# Patient Record
Sex: Male | Born: 1980 | Hispanic: Yes | Marital: Single
Health system: Midwestern US, Academic
[De-identification: ages and names within clinical notes are randomized; demographics above are authoritative.]

---

## 2020-09-12 ENCOUNTER — Encounter: Admit: 2020-09-12 | Discharge: 2020-09-12

## 2020-09-13 ENCOUNTER — Encounter: Admit: 2020-09-13 | Discharge: 2020-09-13

## 2020-09-13 NOTE — Telephone Encounter
Records Request    Please fax over the following records including:     -Radiology testing:  FULL Imaging report supporting diagnosis of Peripheral Artery Disease    *Send imaging via Cloud/Powershare system to [facility] mail the requested imaging to   [facility], [address], [place], [place] [address]    Please fax to:   Jeannine K[facility]  Fax: [phone]    Thank you!

## 2020-10-25 ENCOUNTER — Encounter: Admit: 2020-10-25 | Discharge: 2020-10-25 | Primary: Primary Care

## 2020-10-25 NOTE — Progress Notes
Patient Name:  Dailyn Kempner  MRN:  [id number]  DOB:  Dec 12, 1980  Insurance:  No billing information found for this encounter.         Reason for Visit:  New patient   Diagnosis: Veins      Physician Info:    Referring Physician:  No ref. provider found    PCP:  Miguel Rota D      History of Present Illness:  40 year old male referred for right leg swelling and redness. Hx ulcer x2 years.      Scheduled testing: none, patient self-pay      Location of Films:      Date: Imaging: Impression:                         Medical History   has no past medical history on file.          Surgical History  has no past surgical history on file.    Allergies No Known Allergies       Medications:        hydroCHLOROthiazide (HYDRODIURIL) 25 mg tablet Take 25 mg by mouth daily.    losartan (COZAAR) 50 mg tablet Take 50 mg by mouth daily.    metFORMIN (GLUCOPHAGE) 1,000 mg tablet Take 1,000 mg by mouth twice daily after meals.    mupirocin (BACTROBAN) 2 % topical ointment APPLY TO THE AFFECTED AREA(S) THREE TIMES DAILY           Creatinine:     Comments:

## 2020-10-29 ENCOUNTER — Encounter: Admit: 2020-10-29 | Discharge: 2020-10-29 | Primary: Primary Care

## 2020-10-29 ENCOUNTER — Ambulatory Visit: Admit: 2020-10-29 | Discharge: 2020-10-30 | Primary: Primary Care

## 2020-10-29 DIAGNOSIS — I10 Essential (primary) hypertension: Secondary | ICD-10-CM

## 2020-10-29 DIAGNOSIS — I8390 Asymptomatic varicose veins of unspecified lower extremity: Secondary | ICD-10-CM

## 2020-10-29 DIAGNOSIS — E7800 Pure hypercholesterolemia, unspecified: Secondary | ICD-10-CM

## 2020-10-29 DIAGNOSIS — E1151 Type 2 diabetes mellitus with diabetic peripheral angiopathy without gangrene: Secondary | ICD-10-CM

## 2020-10-29 DIAGNOSIS — E119 Type 2 diabetes mellitus without complications: Secondary | ICD-10-CM

## 2020-10-29 NOTE — Progress Notes
Date of Service: 10/29/2020              No chief complaint on file.      History of Present Illness    40 year old Spanish-speaking gentleman who presents with complaints of chronic venous insufficiency.  He has noted scarring of the right lower extremity associated with large varicose veins.  He has had no imaging.  He has no history for deep vein thrombosis.  He has not worn compression stockings.  Is no family history of venous disease.    His symptoms include previous ulceration with pain, aching, heaviness, fatigue, throbbing and burning of approximately 2 years duration.  He denies any trauma to the area.    No past medical history on file.    No past surgical history on file.    Allergies:  No Known Allergies    Medication List:  ? hydroCHLOROthiazide (HYDRODIURIL) 25 mg tablet Take 25 mg by mouth daily.   ? losartan (COZAAR) 50 mg tablet Take 50 mg by mouth daily.   ? metFORMIN (GLUCOPHAGE) 1,000 mg tablet Take 1,000 mg by mouth twice daily after meals.   ? mupirocin (BACTROBAN) 2 % topical ointment APPLY TO THE AFFECTED AREA(S) THREE TIMES DAILY       Social History:       No family history on file.    Review of Systems   Constitutional: Negative.    HENT: Negative.    Eyes: Negative.    Respiratory: Negative.    Cardiovascular: Negative.    Gastrointestinal: Negative.    Endocrine: Negative.    Genitourinary: Negative.    Musculoskeletal: Negative.    Skin: Negative.    Allergic/Immunologic: Negative.    Neurological: Positive for numbness.   Hematological: Negative.    Psychiatric/Behavioral: Negative.                There were no vitals filed for this visit.  There is no height or weight on file to calculate BMI.     Physical Exam  Constitutional:       Appearance: Normal appearance. He is obese.   HENT:      Head: Normocephalic and atraumatic.   Neck:      Comments:   No carotid bruits  Cardiovascular:      Rate and Rhythm: Normal rate and regular rhythm.      Pulses: Normal pulses.      Comments: Large varicose veins right leg extending from above the knee to below the knee, great saphenous distribution.  There is evidence of lipodermatosclerosis in a stocking-like distribution on the medial aspect of the right lower extremity with healed ulcer.  He has 1+ edema, bilaterally.  He has no noticed varicose veins involving the left leg.  Pulmonary:      Effort: Pulmonary effort is normal.      Breath sounds: Normal breath sounds.   Neurological:      General: No focal deficit present.      Mental Status: He is alert and oriented to person, place, and time.             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  2.  History of ulceration, healed.  He has history of edema as well.  3.  Hypercholesterolemia.  4.  Type 2 diabetes.  5.  Essential hypertension.  6. Right Leg VCSS Total: 6; CEAP: 5.  Left Leg VCSS Total: 3; CEAP: 3  Plan: #1 patient is a candidate for venous imaging of the right lower extremity.  I suspect that he has great saphenous insufficiency and would benefit from intervention.  2.  Compression therapy.    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

## 2020-10-30 ENCOUNTER — Encounter: Admit: 2020-10-30 | Discharge: 2020-10-30 | Primary: Primary Care

## 2020-10-30 ENCOUNTER — Ambulatory Visit: Admit: 2020-10-30 | Discharge: 2020-10-30 | Primary: Primary Care

## 2020-10-30 DIAGNOSIS — I83811 Varicose veins of right lower extremities with pain: Secondary | ICD-10-CM

## 2020-10-30 DIAGNOSIS — I8311 Varicose veins of right lower extremity with inflammation: Secondary | ICD-10-CM

## 2020-11-01 ENCOUNTER — Encounter: Admit: 2020-11-01 | Discharge: 2020-11-01 | Primary: Primary Care

## 2020-11-01 DIAGNOSIS — I83811 Varicose veins of right lower extremities with pain: Secondary | ICD-10-CM

## 2020-11-01 NOTE — Telephone Encounter
-----   Message from Corky Crafts, MD sent at 11/01/2020 11:09 AM CDT -----  eva right gsv, mp >20

## 2020-11-01 NOTE — Telephone Encounter
Spoke with pt via interpret ID# [id number] regarding [place] results and recommendations.  Pt understands and would like to proceed with recs.  He does not have insurance coverage and will need an out of pocket cost quote for procedures.

## 2020-11-02 ENCOUNTER — Encounter: Admit: 2020-11-02 | Discharge: 2020-11-02 | Primary: Primary Care

## 2020-11-20 ENCOUNTER — Encounter: Admit: 2020-11-20 | Discharge: 2020-11-20 | Primary: Primary Care

## 2020-11-20 NOTE — Telephone Encounter
Using Spanish interpreter [place], [place] [id number]- called patient to schedule vein procedure.  Patient answered and was disconnected.  Interpreter immediately called patient a second time, leaving message on patient's voicemail.

## 2021-01-21 ENCOUNTER — Encounter: Admit: 2021-01-21 | Discharge: 2021-01-21 | Primary: Primary Care

## 2021-01-22 ENCOUNTER — Emergency Department: Admit: 2021-01-22 | Discharge: 2021-01-21

## 2021-05-20 ENCOUNTER — Encounter: Admit: 2021-05-20 | Discharge: 2021-05-20 | Payer: No Typology Code available for payment source | Primary: Primary Care

## 2021-05-20 NOTE — Telephone Encounter
Dr. Darryl Lent is recommending the following vein procedure:     #1 right gsv eva, right mp    Patient had an OV 11/01/20 and at the time was uninsured. He did receive a self-pay quote. He now has [employer]. There is no policy for varicose veins, however, nurse reviewers are looking for symptoms that affect ADL. Authorization is required.  There is mention of previous symptoms. Are symptoms still occurring? Has any conservative therapy been tried such as leg elevation, weight loss or exercise without relief of symptoms? Any extra documentation that could be used for authorization would be appreciated to establish medical necessity. There is mention of past ulcers. Is there a ICD-10 for that? Note, patient is spanish speaking. Thank  You!

## 2021-05-21 ENCOUNTER — Encounter: Admit: 2021-05-21 | Discharge: 2021-05-21 | Payer: No Typology Code available for payment source | Primary: Primary Care

## 2021-06-03 ENCOUNTER — Encounter: Admit: 2021-06-03 | Discharge: 2021-06-03 | Payer: No Typology Code available for payment source | Primary: Primary Care

## 2021-06-03 ENCOUNTER — Ambulatory Visit: Admit: 2021-06-03 | Discharge: 2021-06-03 | Payer: No Typology Code available for payment source | Primary: Primary Care

## 2021-06-04 ENCOUNTER — Encounter: Admit: 2021-06-04 | Discharge: 2021-06-04 | Payer: No Typology Code available for payment source | Primary: Primary Care

## 2021-06-04 NOTE — Telephone Encounter
Dr. Darryl Lent recommended the following vein procedure:     #1 Right leg radiofrequency ablation of the great saphenous vein [phone] with micro-phlebectomy of the tributary veins [phone]    Patient had been given a self-pay quote 11/02/20; however, he now has insurance. [employer] approved the procedure. His auth is valid 06/03/21-08/31/21. A letter has been mailed. I spoke with patient and he is eager to schedule. Per his request, I am sending FC an e-mail to give him an estimation of what he may owe after the procedure based on his insurance deductible and co-insurance.     Note: He is spanish speaking.

## 2021-06-05 ENCOUNTER — Encounter: Admit: 2021-06-05 | Discharge: 2021-06-05 | Payer: No Typology Code available for payment source | Primary: Primary Care

## 2021-06-05 DIAGNOSIS — G4733 Obstructive sleep apnea (adult) (pediatric): Secondary | ICD-10-CM

## 2021-06-05 NOTE — Telephone Encounter
Outgoing call to patient to offer appointment availability tomorrow with Dr. Kandis Ban to establish care for severe obstructive sleep apnea. Spanish interpreter Rica Mote ID [phone]. Unable to reach patient at [phone] and unable to leave VM to mailbox not being set up.    Attempted to reach patient on alternative contact Franchot Gallo. Spoke to [place]. Patient was with him at the time of call. Reviewed sleep study results and offered appt tomorrow at [facility] with Dr. Kandis Ban. Patient accepted appt. He thanked me for call. No further questions.

## 2021-06-06 ENCOUNTER — Encounter: Admit: 2021-06-06 | Discharge: 2021-06-06 | Payer: No Typology Code available for payment source | Primary: Primary Care

## 2021-06-06 ENCOUNTER — Ambulatory Visit: Admit: 2021-06-06 | Discharge: 2021-06-07 | Payer: No Typology Code available for payment source | Primary: Primary Care

## 2021-06-06 DIAGNOSIS — E119 Type 2 diabetes mellitus without complications: Secondary | ICD-10-CM

## 2021-06-06 DIAGNOSIS — I8390 Asymptomatic varicose veins of unspecified lower extremity: Secondary | ICD-10-CM

## 2021-06-06 DIAGNOSIS — I10 Essential (primary) hypertension: Secondary | ICD-10-CM

## 2021-06-06 MED FILL — LIDOCAINE-PRILOCAINE 2.5-2.5 % TP CREA: TOPICAL | 30 days supply | Qty: 30 | Fill #1 | Status: AC

## 2021-06-06 NOTE — Assessment & Plan Note
I discussed the sleep study results in detail and answered all the questions to patient's satisfaction. I discussed the pathophysiology of sleep apnea and the treatment options which include CPAPor oral appliance or surgery, all in conjunction with weight loss.  I discussed with the patient the consequences of untreated sleep apnea which include HTN, CVA, arrhythmias and CAD. Patient expressed interest in using CPAP. I will start him on auto CPAP 10-20 cmH2O. Discussed with him about meeting compliance for insurance purpose. We will do noc-ox on CPAP in a week.       I discussed with him regarding the maintenance of the machine. I advised him to get a new mask every 3-6 months, a new hose once a year and change filters once a month.    I advised him to avoid sedatives and alcohol. I also advised him to be cautious while operating a motor vehicle when feeling sleepy.       I educated him about the importance of wearing the CPAP every night.

## 2021-06-06 NOTE — Progress Notes
Subjective:       History of Present Illness  Danny Kidd is a 41 y.o. male with past medical history of hyperlipidemia, diabetes, hypertension and morbid obesity is here to review his sleep study results and discuss treatment options.  He has history of snoring for the past 5 years.  He has associated witnessed apneas and gasping episodes at night.  He usually goes to bed around 11 PM and wakes up around 8 or 9 AM.  He usually wakes up with headaches in the morning.  He does not feel refreshed when he wakes up in the morning.  He feels tired and sleepy during daytime.  He does have trouble staying asleep but denies any trouble falling asleep.  He naps without knowing during daytime.  He does not use any sleep aids.  He has gained nearly 40 pounds in the last 3 years.  He denies any restless leg symptoms.  He denies any frequent leg movements at night.  He does not have any family history of sleep apnea.  He never had a sleep study before this.  He quit smoking 3 years ago.  He does not drink alcohol.  He drinks 1 cup of coffee per day.  He denies any history of depression or anxiety.  He had a sleep study done on 06/03/21 which showed severe sleep apnea with AHI of 106.2.  He is oxygen saturations dropped below 88% for 198 minutes.       Review of Systems    All ten systems were reviewed and negative except for those in HPI.    Objective:         ? atorvastatin (LIPITOR) 40 mg tablet Take  by mouth daily.   ? doxycycline hyclate (VIBRAMYCIN) 100 mg capsule Take one capsule by mouth twice daily.   ? hydroCHLOROthiazide (HYDRODIURIL) 25 mg tablet Take one tablet by mouth daily.   ? lidocaine/prilocaine (EMLA) 2.5/2.5 % topical cream Apply 30 grams to operative leg, 2 hours prior to each procedure day   ? LISINOPRIL PO Take 25 mg by mouth. 1 po in the am, 1 po in the pm   ? losartan (COZAAR) 50 mg tablet Take one tablet by mouth daily.   ? metFORMIN (GLUCOPHAGE) 1,000 mg tablet Take one tablet by mouth twice daily after meals.   ? mupirocin (BACTROBAN) 2 % topical ointment APPLY TO THE AFFECTED AREA(S) THREE TIMES DAILY     Vitals:    06/06/21 1349   BP: (!) 149/85   BP Source: Arm, Left Upper   Pulse: 94   Temp: 37 ?C (98.6 ?F)   Resp: 16   SpO2: 98%  Comment: ra   TempSrc: Oral   PainSc: Zero   Weight: 131.5 kg (290 lb)     Body mass index is 46.81 kg/m?Marland Kitchen     Physical Exam    Constitutional: He is oriented to person, place, and time. He appears well-developed.   HENT:   Head: Normocephalic and atraumatic.   Eyes: Conjunctivae and EOM are normal. Pupils are equal, round, and reactive to light.   Neck: Normal range of motion. Neck supple.   Cardiovascular: Normal rate, regular rhythm, normal heart sounds and intact distal pulses.    Pulmonary/Chest: Effort normal and breath sounds normal.   Abdominal: Soft. Bowel sounds are normal.   Musculoskeletal: Normal range of motion.   Neurological: He is alert and oriented to person, place, and time. He has normal reflexes.   Skin: Skin is  warm and dry.   Psychiatric: He has a normal mood and affect.   Nursing note and vitals reviewed.       Assessment and Plan:    Problem   Osa (Obstructive Sleep Apnea)    HST on 06/03/21 showed severe OSA with an AHI of 106.   Time with saturation below 88% for 198 minutes.      Morbid Obesity (Hcc)    BMI 46.81         OSA (obstructive sleep apnea)  I discussed the sleep study results in detail and answered all the questions to patient's satisfaction. I discussed the pathophysiology of sleep apnea and the treatment options which include CPAPor oral appliance or surgery, all in conjunction with weight loss.  I discussed with the patient the consequences of untreated sleep apnea which include HTN, CVA, arrhythmias and CAD. Patient expressed interest in using CPAP. I will start him on auto CPAP 10-20 cmH2O. Discussed with him about meeting compliance for insurance purpose. We will do noc-ox on CPAP in a week.       I discussed with him regarding the maintenance of the machine. I advised him to get a new mask every 3-6 months, a new hose once a year and change filters once a month.    I advised him to avoid sedatives and alcohol. I also advised him to be cautious while operating a motor vehicle when feeling sleepy.       I educated him about the importance of wearing the CPAP every night.     Morbid obesity (HCC)  Class I (BMI ?30 to 35), class II (BMI ?35 to 40), and class III (BMI ?40). He has class 111 obesity. We will refer him to bariatric surgery for evaluation. He has multiple medical problems and would greatly benefit from bariatric surgery to prevent sudden death and long term cardiovascular mortality and morbidity.    Advised him to exercise on a regular basis and watch his diet. I educated him about the importance of losing weight.    RTC in 3 months.

## 2021-06-07 DIAGNOSIS — G4733 Obstructive sleep apnea (adult) (pediatric): Secondary | ICD-10-CM

## 2021-06-17 ENCOUNTER — Encounter: Admit: 2021-06-17 | Discharge: 2021-06-17 | Payer: No Typology Code available for payment source | Primary: Primary Care

## 2021-06-20 ENCOUNTER — Encounter: Admit: 2021-06-20 | Discharge: 2021-06-20 | Payer: No Typology Code available for payment source | Primary: Primary Care

## 2021-06-20 ENCOUNTER — Ambulatory Visit: Admit: 2021-06-20 | Discharge: 2021-06-21 | Payer: No Typology Code available for payment source | Primary: Primary Care

## 2021-06-20 DIAGNOSIS — I83811 Varicose veins of right lower extremities with pain: Secondary | ICD-10-CM

## 2021-06-20 MED ORDER — MIDWEST LIDOCAINE W/EPINEPHRIN 2%-SODIUM BICARBONATE 8.4%-NORMAL SALINE 0.9%
500 mL | Freq: Once | INTRAMUSCULAR | 0 refills | Status: CP
Start: 2021-06-20 — End: ?
  Administered 2021-06-20: 19:00:00 300 mL via INTRAMUSCULAR

## 2021-06-20 MED ORDER — LIDOCAINE (PF) 10 MG/ML (1 %) IJ SOLN
5 mL | Freq: Once | INTRAMUSCULAR | 0 refills | Status: CP
Start: 2021-06-20 — End: ?
  Administered 2021-06-20: 19:00:00 5 mL via INTRAMUSCULAR

## 2021-06-20 NOTE — Patient Instructions
[facility]  [facility]  [address]  [address], [address]  [place], [place] [address]  [phone]  Fax [phone]      Instructions following Vein Procedure    If you have bleeding at home after your procedure that seeps through the ace bandage, you may apply pressure to the area, elevate the leg, also apply ice as needed.  Please also feel free to contact our office in this case    You may remove the compression wrap/stocking and bandages in 24 hours and start using your compression stocking.  Do not be surprised to see drainage on the white gauze pads, this is to be expected.     You may shower after removing the bandages in 24 hours, leaving steri-strips in place.      You may remove the steri-strips in 7 days, it is ok if they fall off before 7 days.    Wear your compression stocking day and night for 7 days, then daily for 3 weeks.  After the initial weeks, it is beneficial to wear a knee high compression stocking for prevention of future vein disease.    Please avoid hot tubs, baths, pools and lakes for one week.    Please avoid long travel (i.e. plane or car) for one week.     Activity:  Walking is OK and encouraged starting the same day as the procedure.  No lower body work out for one week (i.e. running/ weight training)    You are strongly encouraged to take an anti-inflammatory such as Naprosyn (Aleve) twice daily, or Ibuprofen (Motrin, Advil) 400-600 mg three time daily, for at least the first 3 days following your procedure.  Some discomfort post procedure is normal, however if you have prolonged pain that is interfering with your daily activities, please contact our office.     Your follow up appointment has been scheduled with your doctor.  Please call to reschedule if your given time does not work        Please call if you have any questions.    Kelsey Hicks, RN-BSN was your nurse today    Richard C Arnspiger, MD was your doctor today.

## 2021-06-20 NOTE — Procedures
RADIOFREQUENCY ABLATION w/MP    Diagnosis: The encounter diagnosis was Varicose veins of leg with pain, right.    Operation(s):  1. Endovenous radiofrequency ablation of the right great saphenous vein requiring single site microaccess.    2. Microphlebectomy right leg   greater than 21 , Total of 22 varicose veins in the medial, anterior thigh, medial calf and lateral shin.    Surgeon: Suzie Portela, MD      Assistant(s):     Marco Collie, R.N.-B.S.N. and Roanna Epley, M.A. Patient Care Coordinator/ORA                          Medications: Ativan - 1.0mg  and Emla cream - 30 grams    Procedure start time: 1212    Procedure: Varicose clusters were then marked with the patient standing. The patient was placed in the supine position on the operating table, the leg prepped with Chloroprep and sterile drapes applied.    A time out was performed with provider, medical assistant, nurse, and patient verifying correct patient, correct procedure and correct site at  1212.    The patient was placed in reverse-Trendelenburg position and local anesthesia was instilled in the skin overlying the access site.  The great saphenous vein at the level of the knee was punctured through the skin and using ultrasound guidance a guide wire was introduced through the needle which was then exchanged over the guide wire for a 78F sheath.  The guide wire was removed.  The RF probe was placed into the vein through the sheath and positioned 3cm  distal to the saphenofemoral junction measured with ultrasound caliper.    After the RF probe position was verified by ultrasound, tumescent anesthesia (28 mL 2% lidocaine with epinephrine, 5.6 mL 8.4% sodium bicarbonate in 500 mL of 0.9% normal saline)  was infiltrated, under ultrasound guidance, precisely into the perivenous compartment along the entire length of vein from the entry site to the saphenofemoral junction until a halo of fluid was noted around the vein.      The patient was then placed in Trendelenburg position and the superficial venous system was exsanguinated. After RF probe position was again confirmed with ultrasound imaging, RF energy was applied twice to each segment treatment..  The probe was withdrawn at a rate of approximately 6.5 cm at 20 second intervals and monitored to keep the vein wall temperature within 110 ? 5? C and the generator output well below its 40-watt maximum power.  Total treatment  8 cycles, 32 cm, Total Tumescent , Total 1% lidocaine 5ml.    Ablation of the great saphenous vein was completed with radiofrequency ablation.  The sheath and ablation catheter were then removed.  Duplex imaging confirmed the endovascular venous ablation was successful, as it showed no flow present in the treated vein(s) and normal flow in the femoral vein.    Microphlebectomy was then performed.  Tumescent anesthetic was infiltrated along the previously marked varicose clusters.  An 18-gauge needle was used to gain entry through the skin with a #1 Oesch vein hook.  The vein(s) were grasped with mosquito clamps and teased out.  Hemostasis was obtained by direct pressure application.  greater than 21  micro-incisions were required 22. Steri-strips were placed A compression dressing was applied with dry 4 x 4 gauze dressing and an elastic compression wrap.  Extra padding was applied along the lateral knee to prevent peroneal nerve compression injury.  No immediate  procedural complications noted.  The patient tolerated the procedure well and was fully ambulatory after the procedure and left the facility in good condition. Post procedure instructions were given.    Procedure end time: 1255    Suzie Portela, MD                     Date: 06/20/2021

## 2021-06-21 ENCOUNTER — Encounter: Admit: 2021-06-21 | Discharge: 2021-06-21 | Payer: No Typology Code available for payment source | Primary: Primary Care

## 2021-06-21 ENCOUNTER — Ambulatory Visit: Admit: 2021-06-21 | Discharge: 2021-06-21 | Payer: No Typology Code available for payment source | Primary: Primary Care

## 2021-06-21 DIAGNOSIS — I83811 Varicose veins of right lower extremities with pain: Secondary | ICD-10-CM

## 2021-08-02 ENCOUNTER — Encounter: Admit: 2021-08-02 | Discharge: 2021-08-02 | Payer: No Typology Code available for payment source | Primary: Primary Care

## 2021-08-08 ENCOUNTER — Encounter: Admit: 2021-08-08 | Discharge: 2021-08-08 | Payer: No Typology Code available for payment source | Primary: Primary Care

## 2021-08-08 ENCOUNTER — Ambulatory Visit: Admit: 2021-08-08 | Discharge: 2021-08-09 | Payer: No Typology Code available for payment source | Primary: Primary Care

## 2021-08-08 ENCOUNTER — Ambulatory Visit: Admit: 2021-08-08 | Discharge: 2021-08-08 | Payer: No Typology Code available for payment source | Primary: Primary Care

## 2021-08-08 DIAGNOSIS — I8390 Asymptomatic varicose veins of unspecified lower extremity: Secondary | ICD-10-CM

## 2021-08-08 DIAGNOSIS — R222 Localized swelling, mass and lump, trunk: Secondary | ICD-10-CM

## 2021-08-08 DIAGNOSIS — I10 Essential (primary) hypertension: Secondary | ICD-10-CM

## 2021-08-08 DIAGNOSIS — E119 Type 2 diabetes mellitus without complications: Secondary | ICD-10-CM

## 2021-08-08 MED ORDER — CEFAZOLIN 1 GRAM IJ SOLR
3 g | Freq: Once | INTRAVENOUS | 0 refills
Start: 2021-08-08 — End: ?

## 2021-08-08 NOTE — Patient Instructions
Surgery: Monday, May 1st with Dr. Kennedy Bucker    Your surgery will be at the [facility].      Advanced care, conveniently located         Address  [address]  [place], [place] [address]   [phone]    Directions  Located close to [facility], the [address] is just [place] of [address] and [place] in [place], [place]. The main entrance to the hospital is on the south side of the building.    Helpful driving directions  From [id number]  Take [employer] from [phone].    Go north to [employer].   Turn left (west) approximately 1/10 of a mile.   Turn left (south) into the [address] (formerly [employer]).   At stop sign, turn left. Then take the second right (this will be the right turn after the speed bump).    The main entrance is located on the south side of the building. Park in any available space.   Parking  Plentiful complimentary parking is just steps away from the front door, located on the south side of the building. Convenient complimentary parking for [facility] patients is located at that facility.    You will need to check into the kiosk at the main entrance  You will be called up to the main desk for registration.   You will receive a call from the surgical team 1-3 days prior to your surgery, to inform you of what time you will need to arrive the day of your surgery.    -Do not eat or drink (including gum, mints, candy, or chewing tobacco) anything after midnight-12 am the night before your surgery.  -The morning of your surgery brush your teeth and tongue, ok to rinse, but do not drink any water.  -You may take your medications with a sip of water if instructed by your physician to do so.  -Please do not take diabetic medication the morning of your procedure unless otherwise instructed.   -If you are taking blood thinning medications such as Warafin/Coumadin, Clopidogrel/Plavix, or aspirins, please stop taking as instructed prior to the procedure if authorized by your prescribing physician.   -You will need to have someone drive you home.       Please call with any questions or concerns.  Scheduling Bridgett Larsson, [facility] [place]) [phone]  Nurse line Dahlia Client, [facility]) [phone]  Fax [phone]               Department of General Surgery  Discharge instructions    Excision of Lipoma  A lipoma is a growth made of fatty tissue. It is not cancer (benign). It appears as a soft lump. A lipoma may be removed (excised) because you don?t like how it looks. Or it may be removed if it is painful or growing.    Post-Operative Activity  A responsible adult must drive you home.  After receiving sedation or anesthesia, you should not drive, operate heavy machinery or do anything that requires concentration for at least 24 hours after receiving sedation.  We recommend that you avoid activities that would put tension on your incision.    Post-Operative Site Care  Okay to shower and get the incision sites wet after 24 hours, do not submerge in water    Diet  Immediately after surgery, you may begin with liquids and advance diet slowly.    Home Medications  You may resume your home medications, unless the doctor has specifically asked that you stop a certain medication.    Pain Management  Pain medication will be prescribed during the post-operative period.  Please take medication as prescribed.  Do not take pain medication on an empty stomach.  No driving or operating heavy machinery while on pain medication.  Avoid alcohol while on pain medication.  Pain medications cause constipation, please obtain the following over the counter medications prior to surgery.  Please begin taking Senokot if taking pain medication.  If no bowel movement in 48 hours take Senokot and Milk of Magnesia daily until bowel movement.  You may take over the counter pain relieving medication as needed for break through pain.    If you choose not to take the prescribed pain medication, please use over the counter medications for pain control.    For over the counter medications, please use Tylenol (Acetaminophen) or Ibuprofen.  Please call nurse with the following symptoms [phone]:  If pain is not controlled with pain medication.  No Bowel movement in 4 days  Signs or symptoms of infection  Chills, body aches, fever greater than 101.5  Redness, Swelling, Warmth at or around incision sites  Purulent drainage from incision sites    If you feel you are having an emergency, do not hesitate to call 911

## 2021-08-13 ENCOUNTER — Encounter: Admit: 2021-08-13 | Discharge: 2021-08-13 | Payer: No Typology Code available for payment source | Primary: Primary Care

## 2021-08-13 DIAGNOSIS — I8390 Asymptomatic varicose veins of unspecified lower extremity: Secondary | ICD-10-CM

## 2021-08-13 DIAGNOSIS — E119 Type 2 diabetes mellitus without complications: Secondary | ICD-10-CM

## 2021-08-13 DIAGNOSIS — I10 Essential (primary) hypertension: Secondary | ICD-10-CM

## 2021-08-15 ENCOUNTER — Encounter: Admit: 2021-08-15 | Discharge: 2021-08-15 | Payer: No Typology Code available for payment source | Primary: Primary Care

## 2021-08-15 NOTE — Progress Notes
Danny Kidd is a plesant 41 y.o. male.    Here for sleep apnea follow up.Known pt to Dr. Evonnie Pat   He has not picked up his cpap yet. He does not know what cpap is. He is very concerned about his sleep. He gasps and chokes. He can't lay flat to sleep because he feels he is drowning. He is very anxious about sleep. He is hoping to get bariatric surgery. They are requiring him to be tested and treated for OSA before he may proceed. He recently had lipoma surgery and did fine. He reports that he does not have a weight loss surgeon or doctor and he would like to know if we could help him with this.            Due to language barrier, an interpreter was present during the history-taking and subsequent discussion (and for part of the physical exam) with this patient.    Interpreter mode: Transport planner  Interpreter/ ID Number: [id number]      Objective:         ? acetaminophen (TYLENOL) 325 mg tablet Take two tablets by mouth every 6 hours as needed for Pain (Take in alternating fashion with ibuprofen (i.e. take Tylenol, 3 hrs later take ibuprofen, 3 hrs later take Tylenol, etc.)). Do not exceed a total of 4,000 mg of Tylenol in 24 hrs.   ? atorvastatin (LIPITOR) 40 mg tablet Take  by mouth daily.   ? doxycycline hyclate (VIBRAMYCIN) 100 mg capsule Take one capsule by mouth twice daily.   ? hydroCHLOROthiazide (HYDRODIURIL) 25 mg tablet Take one tablet by mouth daily.   ? ibuprofen (IBUPROFEN IB) 200 mg tablet Take two tablets by mouth every 6 hours as needed for Pain (Take in alternating fashion with Tylenol (i.e. take ibuprofen, 3 hrs later take Tylenol, 3 hrs later take ibuprofen, etc.)) for up to 14 days. Take with food and while drinking plenty of water. Do not take if you are taking steroids for other medical needs, or have a history of gastric ulcers or kidney failure.   ? lidocaine/prilocaine (EMLA) 2.5/2.5 % topical cream Apply  topically to affected area as directed. Apply cream to entire leg of normal intact skin two hours prior to procedure   ? LISINOPRIL PO Take 25 mg by mouth. 1 po in the am, 1 po in the pm   ? LORazepam (ATIVAN) 0.5 mg tablet Take one tablet to two tablets by mouth as directed. Take 1-2 tablets upon arrival to procedure, for anxiety, as needed   ? losartan (COZAAR) 50 mg tablet Take one tablet by mouth daily.   ? metFORMIN (GLUCOPHAGE) 1,000 mg tablet Take one tablet by mouth twice daily after meals.   ? mupirocin (BACTROBAN) 2 % topical ointment APPLY TO THE AFFECTED AREA(S) THREE TIMES DAILY   ? other medication Inject one Dose to area(s) as directed. Injection every Thursday 4 X day, 50mg  per injection   ? oxyCODONE (ROXICODONE) 5 mg tablet Take one tablet by mouth every 6 hours as needed for Pain (Take for breakthrough pain not controlled by Tylenol and ibuprofen.) for up to 5 doses. Indications: Take Miralax and Senna while taking Oxycodone to prevent constipation.   ? OZEMPIC 0.25 mg or 0.5 mg(2 mg/1.5 mL) injection PEN Inject two mg under the skin every 7 days.   ? polyethylene glycol 3350 (MIRALAX) 17 g packet Take one packet by mouth daily for 14 days. Indications: Take while taking narcotic pain medication (i.e  oxycodone, Norco). Hold if having loose stools.   ? senna/docusate (SENOKOT-S) 8.6/50 mg tablet Take one tablet by mouth twice daily for 14 days. Indications: Take while taking narcotic pain medication (i.e oxycodone, Norco). Hold if having loose stools.     Vitals:    08/28/21 1000   BP: 135/86   BP Source: Arm, Left Upper   Pulse: 97   Resp: 16   SpO2: 96%   PainSc: Zero   Weight: 126.1 kg (278 lb)   Height: 166 cm (5' 5.35)     Body mass index is 45.76 kg/m?Marland Kitchen     Physical Exam  Constitutional:       Appearance: Normal appearance.   HENT:      Head: Normocephalic.   Eyes:      Pupils: Pupils are equal, round, and reactive to light.   Pulmonary:      Effort: Pulmonary effort is normal.   Neurological:      General: No focal deficit present.      Mental Status: He is alert and oriented to person, place, and time. Mental status is at baseline.   Psychiatric:         Mood and Affect: Mood normal.         Behavior: Behavior normal.         Thought Content: Thought content normal.         Judgment: Judgment normal.                Assessment and Plan:    Problem   Osa (Obstructive Sleep Apnea)    HST on 06/03/21 showed severe OSA with an AHI of 106.   Time with saturation below 88% for 198 minutes.   CPAP and ono ordered 08/15/21 per Feb appt         OSA (obstructive sleep apnea)  Has not picked up cpap yet  Discussed [facility] will call him to pick it up  Revisited his dx of severe OSA with hypoxia  Educated that he just needs to wear the machine to get my approval for bariatric surgery  He is very concerned about surgery  Will schedule for a two month follow up for compliance                 We discussed the importance of appropriate cleaning and replacement of supplies     Follow up in 2 months time.  Time spent: 30 minutes

## 2021-08-15 NOTE — Telephone Encounter
Order for CPAP 10-20cm H2O and Noc Ox on cpap and RA placed as discussed per office note on 08/15/21    DME: [facility]  Will continue to follow up.

## 2021-08-15 NOTE — Patient Instructions
-   If you have any questions regarding your appointment or any concerns, please contact Franchot Gallo, RN at [phone].    - If you need to schedule or reschedule an appointment, please call [phone].     -For refills on medications, please have your pharmacy fax a refill authorization request form to our office at [phone]. Please allow at least 3 business days for refill requests.    - For any urgent issues after business hours, please call [phone] and have the on call pulmonary physician paged.     - If you have any questions regarding your appointment or any concerns, please contact Franchot Gallo, RN at [phone].    - If you need to schedule or reschedule an appointment, please call [phone].     -For refills on medications, please have your pharmacy fax a refill authorization request form to our office at [phone]. Please allow at least 3 business days for refill requests.    - For any urgent issues after business hours, please call [phone] and have the on call pulmonary physician paged.

## 2021-08-15 NOTE — Telephone Encounter
Called pt with Interpreter [phone] Marcello Moores to notify that there was a delay getting cpap and wanted to know if pt wanted to keep appointment or reschedule after he got his machine. Pt states he still would like to keep apt. Provided pt address.

## 2021-08-16 ENCOUNTER — Encounter: Admit: 2021-08-16 | Discharge: 2021-08-16 | Payer: No Typology Code available for payment source | Primary: Primary Care

## 2021-08-19 ENCOUNTER — Encounter: Admit: 2021-08-19 | Discharge: 2021-08-19 | Payer: No Typology Code available for payment source | Primary: Primary Care

## 2021-08-19 ENCOUNTER — Ambulatory Visit: Admit: 2021-08-19 | Discharge: 2021-08-19 | Payer: No Typology Code available for payment source | Primary: Primary Care

## 2021-08-19 DIAGNOSIS — I10 Essential (primary) hypertension: Secondary | ICD-10-CM

## 2021-08-19 DIAGNOSIS — E119 Type 2 diabetes mellitus without complications: Secondary | ICD-10-CM

## 2021-08-19 DIAGNOSIS — I8390 Asymptomatic varicose veins of unspecified lower extremity: Secondary | ICD-10-CM

## 2021-08-19 MED ORDER — ACETAMINOPHEN 1,000 MG/100 ML (10 MG/ML) IV SOLN
INTRAVENOUS | 0 refills | Status: DC
Start: 2021-08-19 — End: 2021-08-19

## 2021-08-19 MED ORDER — MIDAZOLAM 1 MG/ML IJ SOLN
INTRAVENOUS | 0 refills | Status: DC
Start: 2021-08-19 — End: 2021-08-19

## 2021-08-19 MED ORDER — KETOROLAC 30 MG/ML (1 ML) IJ SOLN
INTRAVENOUS | 0 refills | Status: DC
Start: 2021-08-19 — End: 2021-08-19

## 2021-08-19 MED ORDER — LIDOCAINE (PF) 100 MG/5 ML (2 %) IV SYRG
INTRAVENOUS | 0 refills | Status: DC
Start: 2021-08-19 — End: 2021-08-19

## 2021-08-19 MED ORDER — PROPOFOL 10 MG/ML IV EMUL 50 ML (INFUSION)(AM)(OR)
INTRAVENOUS | 0 refills | Status: DC
Start: 2021-08-19 — End: 2021-08-19

## 2021-08-20 ENCOUNTER — Encounter: Admit: 2021-08-20 | Discharge: 2021-08-20 | Payer: No Typology Code available for payment source | Primary: Primary Care

## 2021-08-20 DIAGNOSIS — I10 Essential (primary) hypertension: Secondary | ICD-10-CM

## 2021-08-20 DIAGNOSIS — E119 Type 2 diabetes mellitus without complications: Secondary | ICD-10-CM

## 2021-08-20 DIAGNOSIS — I8390 Asymptomatic varicose veins of unspecified lower extremity: Secondary | ICD-10-CM

## 2021-08-26 ENCOUNTER — Encounter: Admit: 2021-08-26 | Discharge: 2021-08-26 | Payer: No Typology Code available for payment source | Primary: Primary Care

## 2021-08-26 ENCOUNTER — Ambulatory Visit: Admit: 2021-08-26 | Discharge: 2021-08-27 | Payer: No Typology Code available for payment source | Primary: Primary Care

## 2021-08-26 DIAGNOSIS — E119 Type 2 diabetes mellitus without complications: Secondary | ICD-10-CM

## 2021-08-26 DIAGNOSIS — I8390 Asymptomatic varicose veins of unspecified lower extremity: Secondary | ICD-10-CM

## 2021-08-26 DIAGNOSIS — I10 Essential (primary) hypertension: Secondary | ICD-10-CM

## 2021-08-26 NOTE — Patient Instructions
Dental Resources for [place] Coverage    Dentist/ Office Address Phone   Carol J. Jones  [facility] [address]  [place], [place] [address] [phone]   Aniika Jackson, Peggy Young, David Ewing  [facility] [address]  [address]  [place], [place] [address] [phone]   Brenda Bohaty, Amy Burleson  [facility] [address] [place] [address] [phone]   Amit Kumar  [facility] [address] [place] [address] [phone]   Marquita Mays, Aniika Jackson, Megan Thomas, Peggy Young  [facility] [address] [place] [address]    [address] [place] [address]    [address]  [place] [address] [phone]      [phone]   Adela Casa  [facility] [address] [place] [address] [phone]   John Purk, DDS, MS [address]  [address]  [place] [address] [phone]   Stephen B Koshland, DDS [address]  [place], [place] [address] [phone]   [facility] [address]  [place], [place] [address] [phone]   Michael L Leblanc, Jill Jenkins, Enayat E Astani, Brad Jenkins  [facility] [address]  [place], [place] [address] [phone]   John T Doyle, Nevin K Water, DDS [facility] [address]  [place] [address]    [address]  Mobile Unit  [place], [place] [address] [phone]   Vermelle L Brown-Ghoston, Chet E Welker  [facility] [address]  [place], [place] [address] [phone]   Julia Chung  [facility] [address]  [place] [address] [phone]   Todd Thurlow  [facility] [address]  [place], [place] [address] [phone]   Jaime Stinnett, DDS [address]  [place], [place] [address] [phone]                     Affordable Dental Resources    Office Location Phone   [facility] [address]  [place], [place] [address] [phone]   [facility] [address]  [place], [place] [address] [phone]   [facility] [address]  [place], [place] [address] [phone]   [facility] [address]  [place], [place] [address] [phone]   [facility] [address] [place] [address] [phone]   [facility] [address] [place] [address] [phone]   [facility] [address] [place] [address] [phone]   [facility] [address] [place] [address] [phone]

## 2021-08-27 DIAGNOSIS — Z0000 Encounter for general adult medical examination without abnormal findings: Secondary | ICD-10-CM

## 2021-08-27 DIAGNOSIS — E1151 Type 2 diabetes mellitus with diabetic peripheral angiopathy without gangrene: Secondary | ICD-10-CM

## 2021-08-28 ENCOUNTER — Ambulatory Visit
Admit: 2021-08-28 | Discharge: 2021-08-29 | Payer: No Typology Code available for payment source | Primary: Student in an Organized Health Care Education/Training Program

## 2021-08-28 ENCOUNTER — Encounter
Admit: 2021-08-28 | Discharge: 2021-08-28 | Payer: No Typology Code available for payment source | Primary: Student in an Organized Health Care Education/Training Program

## 2021-08-28 DIAGNOSIS — I8390 Asymptomatic varicose veins of unspecified lower extremity: Secondary | ICD-10-CM

## 2021-08-28 DIAGNOSIS — I10 Essential (primary) hypertension: Secondary | ICD-10-CM

## 2021-08-28 DIAGNOSIS — G4733 Obstructive sleep apnea (adult) (pediatric): Secondary | ICD-10-CM

## 2021-08-28 DIAGNOSIS — E119 Type 2 diabetes mellitus without complications: Secondary | ICD-10-CM

## 2021-08-28 NOTE — Assessment & Plan Note
Has not picked up cpap yet  Discussed [facility] will call him to pick it up  Revisited his dx of severe OSA with hypoxia  Educated that he just needs to wear the machine to get my approval for bariatric surgery  He is very concerned about surgery  Will schedule for a two month follow up for compliance

## 2021-08-30 ENCOUNTER — Encounter
Admit: 2021-08-30 | Discharge: 2021-08-30 | Payer: No Typology Code available for payment source | Primary: Student in an Organized Health Care Education/Training Program

## 2021-08-30 DIAGNOSIS — I8390 Asymptomatic varicose veins of unspecified lower extremity: Secondary | ICD-10-CM

## 2021-08-30 DIAGNOSIS — I10 Essential (primary) hypertension: Secondary | ICD-10-CM

## 2021-08-30 DIAGNOSIS — E119 Type 2 diabetes mellitus without complications: Secondary | ICD-10-CM

## 2021-08-30 NOTE — Progress Notes
Patient Name:  Danny Kidd  MRN:  [id number]  DOB:  [birth year]  Age: 41 y.o.  Insurance:  Payor: [employer] / Plan: AMBETTER [place] / Product Type: *No Product type* /     Physician Info:   ? Referring Physician:  No ref. provider found   ? PCP:  [name] A     Diagnosis: Veins     Reason for Visit:  Post-op    - RGSV, RMP done 06/20/21 by Dr. Junita Push.    Scheduled testing:     Location of Films:  IN HOUSE    Date: Imaging: Impression:   06/21/21 Right post ablation Successful ablation, neg HIT                    Medical History   has a past medical history of DM (diabetes mellitus) (HCC), Hypertension, and Varicose veins of lower extremity.          Surgical History  has a past surgical history that includes vein stripping; tumor excision (Left, 08/19/2021); endovenous ablation of the great saphenous vein (Right, 06/20/2021); and microphlebectomy (Right, 06/20/2021).    Allergies No Known Allergies       Medications:       ? acetaminophen (TYLENOL) 325 mg tablet Take two tablets by mouth every 6 hours as needed for Pain (Take in alternating fashion with ibuprofen (i.e. take Tylenol, 3 hrs later take ibuprofen, 3 hrs later take Tylenol, etc.)). Do not exceed a total of 4,000 mg of Tylenol in 24 hrs.   ? atorvastatin (LIPITOR) 40 mg tablet Take  by mouth daily.   ? doxycycline hyclate (VIBRAMYCIN) 100 mg capsule Take one capsule by mouth twice daily.   ? hydroCHLOROthiazide (HYDRODIURIL) 25 mg tablet Take one tablet by mouth daily.   ? ibuprofen (IBUPROFEN IB) 200 mg tablet Take two tablets by mouth every 6 hours as needed for Pain (Take in alternating fashion with Tylenol (i.e. take ibuprofen, 3 hrs later take Tylenol, 3 hrs later take ibuprofen, etc.)) for up to 14 days. Take with food and while drinking plenty of water. Do not take if you are taking steroids for other medical needs, or have a history of gastric ulcers or kidney failure.   ? lidocaine/prilocaine (EMLA) 2.5/2.5 % topical cream Apply  topically to affected area as directed. Apply cream to entire leg of normal intact skin two hours prior to procedure   ? LISINOPRIL PO Take 25 mg by mouth. 1 po in the am, 1 po in the pm   ? LORazepam (ATIVAN) 0.5 mg tablet Take one tablet to two tablets by mouth as directed. Take 1-2 tablets upon arrival to procedure, for anxiety, as needed   ? losartan (COZAAR) 50 mg tablet Take one tablet by mouth daily.   ? metFORMIN (GLUCOPHAGE) 1,000 mg tablet Take one tablet by mouth twice daily after meals.   ? mupirocin (BACTROBAN) 2 % topical ointment APPLY TO THE AFFECTED AREA(S) THREE TIMES DAILY   ? other medication Inject one Dose to area(s) as directed. Injection every Thursday 4 X day, 50mg  per injection   ? oxyCODONE (ROXICODONE) 5 mg tablet Take one tablet by mouth every 6 hours as needed for Pain (Take for breakthrough pain not controlled by Tylenol and ibuprofen.) for up to 5 doses. Indications: Take Miralax and Senna while taking Oxycodone to prevent constipation.   ? OZEMPIC 0.25 mg or 0.5 mg(2 mg/1.5 mL) injection PEN Inject two mg under the skin every 7 days.   ?  polyethylene glycol 3350 (MIRALAX) 17 g packet Take one packet by mouth daily for 14 days. Indications: Take while taking narcotic pain medication (i.e oxycodone, Norco). Hold if having loose stools.   ? senna/docusate (SENOKOT-S) 8.6/50 mg tablet Take one tablet by mouth twice daily for 14 days. Indications: Take while taking narcotic pain medication (i.e oxycodone, Norco). Hold if having loose stools.           Creatinine:     Comments:

## 2021-09-04 ENCOUNTER — Encounter
Admit: 2021-09-04 | Discharge: 2021-09-04 | Payer: No Typology Code available for payment source | Primary: Student in an Organized Health Care Education/Training Program

## 2021-09-04 ENCOUNTER — Ambulatory Visit
Admit: 2021-09-04 | Discharge: 2021-09-05 | Payer: No Typology Code available for payment source | Primary: Student in an Organized Health Care Education/Training Program

## 2021-09-04 DIAGNOSIS — I8390 Asymptomatic varicose veins of unspecified lower extremity: Secondary | ICD-10-CM

## 2021-09-04 DIAGNOSIS — I10 Essential (primary) hypertension: Secondary | ICD-10-CM

## 2021-09-04 DIAGNOSIS — R222 Localized swelling, mass and lump, trunk: Secondary | ICD-10-CM

## 2021-09-04 DIAGNOSIS — E119 Type 2 diabetes mellitus without complications: Secondary | ICD-10-CM

## 2021-09-04 NOTE — Progress Notes
Doing well.  No tenderness.  No drainage.  Has resumed regular activity    VSS afebrile  Incision well approximated with some Dermabond still in place.  No erythema or ecchymosis.  No tenderness.  No seroma    S/P excision of left flank mass    Increase activity as tolerated  Full release  Requested a referral to bariatric surgery for possible weight loss surgery.  RTC as needed

## 2021-09-05 ENCOUNTER — Encounter
Admit: 2021-09-05 | Discharge: 2021-09-05 | Payer: No Typology Code available for payment source | Primary: Student in an Organized Health Care Education/Training Program

## 2021-09-05 DIAGNOSIS — D179 Benign lipomatous neoplasm, unspecified: Secondary | ICD-10-CM

## 2021-09-05 DIAGNOSIS — I8390 Asymptomatic varicose veins of unspecified lower extremity: Secondary | ICD-10-CM

## 2021-09-05 DIAGNOSIS — E119 Type 2 diabetes mellitus without complications: Secondary | ICD-10-CM

## 2021-09-05 DIAGNOSIS — I10 Essential (primary) hypertension: Secondary | ICD-10-CM

## 2021-09-10 ENCOUNTER — Encounter
Admit: 2021-09-10 | Discharge: 2021-09-10 | Payer: No Typology Code available for payment source | Primary: Student in an Organized Health Care Education/Training Program

## 2021-09-10 ENCOUNTER — Ambulatory Visit
Admit: 2021-09-10 | Discharge: 2021-09-11 | Payer: No Typology Code available for payment source | Primary: Student in an Organized Health Care Education/Training Program

## 2021-09-10 DIAGNOSIS — I8390 Asymptomatic varicose veins of unspecified lower extremity: Secondary | ICD-10-CM

## 2021-09-10 DIAGNOSIS — I872 Venous insufficiency (chronic) (peripheral): Secondary | ICD-10-CM

## 2021-09-10 DIAGNOSIS — D179 Benign lipomatous neoplasm, unspecified: Secondary | ICD-10-CM

## 2021-09-10 DIAGNOSIS — I8311 Varicose veins of right lower extremity with inflammation: Secondary | ICD-10-CM

## 2021-09-10 DIAGNOSIS — I83811 Varicose veins of right lower extremities with pain: Secondary | ICD-10-CM

## 2021-09-10 DIAGNOSIS — I10 Essential (primary) hypertension: Secondary | ICD-10-CM

## 2021-09-10 DIAGNOSIS — E119 Type 2 diabetes mellitus without complications: Secondary | ICD-10-CM

## 2021-09-10 NOTE — Progress Notes
Date of Service: 09/10/2021              Chief Complaint   Patient presents with   ? Post Operative Visit       History of Present Illness    41 year old pleasant Spanish-speaking gentleman who is status post endovenous ablation of the right great saphenous vein with microphlebectomy performed on June 20, 2021.  He is doing well with good symptom relief.  He still demonstrates some mild swelling in the right lower extremity.  Follow-up venous duplex scan performed on March 3 demonstrates successful ablation of the great saphenous vein with no evidence of heat induced deep vein thrombosis.  He continues to wear compression stockings.    Medical History:   Diagnosis Date   ? DM (diabetes mellitus) (HCC)    ? Hypertension    ? Lipoma     left mid-flank region   ? Varicose veins of lower extremity        Surgical History:   Procedure Laterality Date   ? HX ENDOVENOUS ABLATION OF THE GREAT SAPHENOUS VEIN Right 06/20/2021    Dr. Junita Push   ? HX MICROPHLEBECTOMY Right 06/20/2021    RMP Dr. Junita Push   ? EXCISION OF LEFT ABDOMINAL WALL MASS Left 08/19/2021    Performed by Dellia Cloud[facility] DO at [facility]   ? VEIN STRIPPING         Allergies:  No Known Allergies    Medication List:  ? atorvastatin (LIPITOR) 40 mg tablet Take  by mouth daily.   ? LISINOPRIL PO Take 25 mg by mouth. 1 po in the am, 1 po in the pm   ? losartan (COZAAR) 50 mg tablet Take one tablet by mouth daily.   ? metFORMIN (GLUCOPHAGE) 1,000 mg tablet Take one tablet by mouth twice daily after meals.   ? other medication Inject one Dose to area(s) as directed. Injection every Thursday 4 X day, 50mg  per injection   ? OZEMPIC 0.25 mg or 0.5 mg(2 mg/1.5 mL) injection PEN Inject two mg under the skin every 7 days.       Social History:   reports that he has been smoking cigarettes. He has been exposed to tobacco smoke. He has never used smokeless tobacco. He reports that he does not currently use alcohol. He reports that he does not use drugs.    History reviewed. No pertinent family history.    Review of Systems            Vitals:    09/10/21 1328 09/10/21 1331   BP: 130/84 (!) 141/88   BP Source: Arm, Left Upper Arm, Right Upper   Pulse: 94 91   Temp: 37 ?C (98.6 ?F)    TempSrc: Oral    PainSc: Zero    Weight: 133.6 kg (294 lb 9.6 oz)    Height: 166 cm (5' 5.35)      Body mass index is 48.49 kg/m?Marland Kitchen     Physical Exam  Vitals and nursing note reviewed.   Constitutional:       Appearance: He is obese.   Abdominal:      Comments: Central obesity   Skin:     Comments: Lipodermatosclerosis present right lower extremity as before.  There are no residual varicose tributaries.  There remains 1+ pitting edema.  2+ palpable bilateral pedal pulses.   Neurological:      General: No focal deficit present.      Mental Status: He is alert  and oriented to person, place, and time.             Assessment and Plan:      Impression: #1 status post right great saphenous endovenous ablation with microphlebectomy.  Patient is doing well post procedure.  Ultrasound demonstrates successful ablation of the great saphenous vein with no evidence of heat induced deep vein thrombosis.  2.  Morbid central obesity.  There is no doubt is causing compression on the abdominal outflow veins producing venous hypertension of the lower extremities worse right than left.  3.  Right Leg VCSS Total: 7; CEAP: 4.  Left Leg VCSS Total: 0; CEAP: 1      Recommendation: #1 patient is a candidate for bariatric surgery.  Rationale is to relieve central obesity therefore alleviating the venous hypertension due to compression of the abdominal veins.  2.  Continue knee-high compression therapy.  3.  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

## 2021-09-11 ENCOUNTER — Encounter
Admit: 2021-09-11 | Discharge: 2021-09-11 | Payer: No Typology Code available for payment source | Primary: Student in an Organized Health Care Education/Training Program

## 2021-09-11 NOTE — Telephone Encounter
Received request from DME for provider to sign form necessary for billing. CMN for cpap/supplies. Form completed and given to provider for signature.    Form will be faxed to [facility]

## 2021-10-04 ENCOUNTER — Encounter
Admit: 2021-10-04 | Discharge: 2021-10-04 | Payer: No Typology Code available for payment source | Primary: Student in an Organized Health Care Education/Training Program

## 2021-10-04 NOTE — Telephone Encounter
Repeat ONO order fax to DME: [facility]

## 2021-10-29 ENCOUNTER — Encounter
Admit: 2021-10-29 | Discharge: 2021-10-29 | Payer: No Typology Code available for payment source | Primary: Student in an Organized Health Care Education/Training Program

## 2021-10-30 NOTE — Progress Notes
Danny Kidd is a plesant 41 y.o. male.    Here for sleep apnea follow up.Known pt to Dr. Evonnie Pat  He takes no sleep medications. Wears a full face mask that fits very  uncomfortably with very large leak. He has not been wearing cpap as much due his headgear being very uncomfortable. He reports that it is tight and feels like a lot of pressure on the back of his head. He also did not understand that he has sleep apnea when he doesn't wear the mask. He thought if he wore it a few hours per night he would be ok.   He mentions that he has been coughing up what feels like liquid out of his lungs. This started about two years ago. It starts with feeling sudden shortness of breath. He feels he has to cough until he expels something or he can't breathe. He reports it has happened about ten times total. It most often happens at night. He is very concerned that this continues to happen.    Due to language barrier, an interpreter was present during the history-taking and subsequent discussion (and for part of the physical exam) with this patient.    Interpreter mode: Transport planner  Interpreter/ ID Number: [id number]             [place] 14/24    Objective:         ? atorvastatin (LIPITOR) 40 mg tablet Take  by mouth daily.   ? canagliflozin-metFORMIN (INVOKAMET) 50-1,000 mg tablet Take  by mouth every 12 hours.   ? furosemide (LASIX) 20 mg tablet Take one tablet by mouth daily.   ? LISINOPRIL PO Take 25 mg by mouth. 1 po in the am, 1 po in the pm   ? metFORMIN (GLUCOPHAGE) 1,000 mg tablet Take one tablet by mouth twice daily after meals.   ? other medication Inject one Dose to area(s) as directed. Injection every Thursday 4 X day, 50mg  per injection   ? OZEMPIC 0.25 mg or 0.5 mg(2 mg/1.5 mL) injection PEN Inject two mg under the skin every 7 days.   ? simvastatin (ZOCOR) 40 mg tablet Take  by mouth daily.     Vitals:    11/12/21 0909   BP: (!) 139/95   BP Source: Arm, Left Upper   Pulse: 93   Resp: 16   SpO2: 97%   PainSc: Zero   Weight: 135 kg (297 lb 9.9 oz)   Height: 167 cm (5' 5.75)     Body mass index is 48.41 kg/m?Marland Kitchen     Physical Exam  Constitutional:       Appearance: Normal appearance.   HENT:      Head: Normocephalic.   Eyes:      Pupils: Pupils are equal, round, and reactive to light.   Pulmonary:      Effort: Pulmonary effort is normal.   Neurological:      General: No focal deficit present.      Mental Status: He is alert and oriented to person, place, and time. Mental status is at baseline.   Psychiatric:         Mood and Affect: Mood normal.         Behavior: Behavior normal.         Thought Content: Thought content normal.         Judgment: Judgment normal.              Reviewed CPAP download.  RN obtained  PAP download. 29/30 days of usage.   Wearing 4 hours or greater 33% of nights. The average use on days used was 3 hours and 31 minutes.  Residual AHI 9.2.    The 95th percentile pressure was 10.9 cmH20, with a maximum of 11.3 cmH20, and a median of 10 cmH20.  The median leak was 94.8 L/min.      Assessment and Plan:    Problem   Osa (Obstructive Sleep Apnea)    HST on 06/03/21 showed severe OSA with an AHI of 106.   Time with saturation below 88% for 198 minutes.   ONO 10/16/21 pt not wearing cpap <88% 2 hr and 10 min  Repeat ONO ordered 10/25/21    Pt set up with AutoPAP @ 10-20cm H2O on 09/06/21.  DME: [facility]  Insurance: [employer]  If the patient has to meet compliance, their compliance window will be from 10/07/21 to 12/05/21.            OSA (obstructive sleep apnea)  Reviewed CPAP download. 29/30 days of usage.   Wearing 4 hours or greater 33% of nights. The average use on days used was 3 hours and 31 minutes.  Residual AHI 9.2.    The 95th percentile pressure was 10.9 cmH20, with a maximum of 11.3 cmH20, and a median of 10 cmH20.  The median leak was 94.8 L/min.    - Will continue current settings, Auto CPAP 10 to 20cm H2O.     - Keep in contact with DME for routine supplies, need to contact us directly if unable to communicate with DME in a timely manner.   We extensively discussed masks and potentially changing his. He would like to continue working with the one he has. He thinks he can continue to adjust.  We discussed the very large leak. He will work on mask seal  We discussed his significant hypoxia despite pressure changes on cpap  He had a sleep study ordered a few days ago by Dr. Algis Downs. I explained to him what it was and to schedule it so we can find the most appropriate settings for him. I suspect he needs BIPAP.                We discussed the importance of appropriate cleaning and replacement of supplies     Follow up according to sleep study results    Total face time 35 minutes. Additional 10 minutes spent coordinating visit, documentation/review of prior testing or other data and placing corresponding orders

## 2021-11-12 ENCOUNTER — Encounter
Admit: 2021-11-12 | Discharge: 2021-11-12 | Payer: No Typology Code available for payment source | Primary: Student in an Organized Health Care Education/Training Program

## 2021-11-12 ENCOUNTER — Ambulatory Visit
Admit: 2021-11-12 | Discharge: 2021-11-13 | Payer: No Typology Code available for payment source | Primary: Student in an Organized Health Care Education/Training Program

## 2021-11-12 DIAGNOSIS — I8390 Asymptomatic varicose veins of unspecified lower extremity: Secondary | ICD-10-CM

## 2021-11-12 DIAGNOSIS — G4733 Obstructive sleep apnea (adult) (pediatric): Secondary | ICD-10-CM

## 2021-11-12 DIAGNOSIS — E119 Type 2 diabetes mellitus without complications: Secondary | ICD-10-CM

## 2021-11-12 DIAGNOSIS — D179 Benign lipomatous neoplasm, unspecified: Secondary | ICD-10-CM

## 2021-11-12 DIAGNOSIS — I10 Essential (primary) hypertension: Secondary | ICD-10-CM

## 2021-11-12 NOTE — Assessment & Plan Note
Reviewed CPAP download. 29/30 days of usage.   Wearing 4 hours or greater 33% of nights. The average use on days used was 3 hours and 31 minutes.  Residual AHI 9.2.    The 95th percentile pressure was 10.9 cmH20, with a maximum of 11.3 cmH20, and a median of 10 cmH20.  The median leak was 94.8 L/min.    - Will continue current settings, Auto CPAP 10 to 20cm H2O.     - Keep in contact with DME for routine supplies, need to contact us directly if unable to communicate with DME in a timely manner.   We extensively discussed masks and potentially changing his. He would like to continue working with the one he has. He thinks he can continue to adjust.  We discussed the very large leak. He will work on mask seal  We discussed his significant hypoxia despite pressure changes on cpap  He had a sleep study ordered a few days ago by Dr. Keturah Barre. I explained to him what it was and to schedule it so we can find the most appropriate settings for him. I suspect he needs BIPAP.

## 2021-11-12 NOTE — Telephone Encounter
The patient's PAP compliance f/u office visit notes from 11/12/21 were faxed to their DME, [facility].

## 2021-12-10 ENCOUNTER — Encounter
Admit: 2021-12-10 | Discharge: 2021-12-10 | Payer: No Typology Code available for payment source | Primary: Student in an Organized Health Care Education/Training Program

## 2021-12-10 ENCOUNTER — Ambulatory Visit
Admit: 2021-12-10 | Discharge: 2021-12-11 | Payer: No Typology Code available for payment source | Primary: Student in an Organized Health Care Education/Training Program

## 2021-12-10 DIAGNOSIS — E119 Type 2 diabetes mellitus without complications: Secondary | ICD-10-CM

## 2021-12-10 DIAGNOSIS — I10 Essential (primary) hypertension: Secondary | ICD-10-CM

## 2021-12-10 DIAGNOSIS — G4733 Obstructive sleep apnea (adult) (pediatric): Secondary | ICD-10-CM

## 2021-12-10 DIAGNOSIS — I8390 Asymptomatic varicose veins of unspecified lower extremity: Secondary | ICD-10-CM

## 2021-12-10 DIAGNOSIS — D179 Benign lipomatous neoplasm, unspecified: Secondary | ICD-10-CM

## 2021-12-10 DIAGNOSIS — E1151 Type 2 diabetes mellitus with diabetic peripheral angiopathy without gangrene: Secondary | ICD-10-CM

## 2021-12-10 MED ORDER — METFORMIN 1,000 MG PO TAB
1000 mg | ORAL_TABLET | Freq: Two times a day (BID) | ORAL | 3 refills | Status: AC
Start: 2021-12-10 — End: ?

## 2021-12-10 MED ORDER — LISINOPRIL 40 MG PO TAB
40 mg | ORAL_TABLET | Freq: Every day | ORAL | 3 refills | Status: AC
Start: 2021-12-10 — End: ?
  Filled 2022-01-05: qty 60, 30d supply, fill #1

## 2021-12-10 MED ORDER — GABAPENTIN 300 MG PO CAP
300 mg | ORAL_CAPSULE | ORAL | 1 refills | Status: AC
Start: 2021-12-10 — End: ?

## 2021-12-10 MED ORDER — OZEMPIC 2 MG/DOSE (8 MG/3 ML) SC PNIJ
2 mg | SUBCUTANEOUS | 3 refills | Status: AC
Start: 2021-12-10 — End: ?

## 2021-12-10 NOTE — Patient Instructions
Si su aseguranza dice que no van a cubrir la evaluacion de la clinic bariatrica, puede llamarnos al numero [phone]

## 2021-12-11 DIAGNOSIS — E7800 Pure hypercholesterolemia, unspecified: Secondary | ICD-10-CM

## 2022-01-01 ENCOUNTER — Encounter
Admit: 2022-01-01 | Discharge: 2022-01-01 | Payer: No Typology Code available for payment source | Primary: Student in an Organized Health Care Education/Training Program

## 2022-01-01 ENCOUNTER — Emergency Department: Admit: 2022-01-01 | Discharge: 2022-01-01 | Payer: No Typology Code available for payment source

## 2022-01-01 DIAGNOSIS — I8390 Asymptomatic varicose veins of unspecified lower extremity: Secondary | ICD-10-CM

## 2022-01-01 DIAGNOSIS — E119 Type 2 diabetes mellitus without complications: Secondary | ICD-10-CM

## 2022-01-01 DIAGNOSIS — I5021 Acute systolic (congestive) heart failure: Secondary | ICD-10-CM

## 2022-01-01 DIAGNOSIS — I10 Essential (primary) hypertension: Secondary | ICD-10-CM

## 2022-01-01 DIAGNOSIS — D179 Benign lipomatous neoplasm, unspecified: Secondary | ICD-10-CM

## 2022-01-01 LAB — D-DIMER: D-DIMER: 202 ng{FEU}/mL — ABNORMAL HIGH (ref <500)

## 2022-01-01 LAB — INFLUENZA A/B AND RSV PCR
FLU A: NEGATIVE MMOL/L (ref 98–110)
FLU B: NEGATIVE mg/dL — ABNORMAL HIGH (ref 70–100)
RSV: NEGATIVE mg/dL (ref 7–25)

## 2022-01-01 LAB — COMPREHENSIVE METABOLIC PANEL
ALBUMIN: 3.8 g/dL (ref 3.5–5.0)
ALK PHOSPHATASE: 51 U/L — ABNORMAL LOW (ref 25–110)
ALT: 43 U/L (ref 7–56)
ANION GAP: 11 K/UL (ref 3–12)
AST: 36 U/L (ref 7–40)
CALCIUM: 8.7 mg/dL — ABNORMAL HIGH (ref 8.5–10.6)
CO2: 23 MMOL/L (ref 21–30)
CREATININE: 1.1 mg/dL (ref 0.4–1.24)
EGFR: >60 mL/min (ref >60)
SODIUM: 136 MMOL/L — ABNORMAL LOW (ref 137–147)

## 2022-01-01 LAB — CBC AND DIFF
ABSOLUTE BASO COUNT: 0 K/UL (ref 0–0.20)
ABSOLUTE EOS COUNT: 0.3 K/UL (ref 0–0.45)
ABSOLUTE MONO COUNT: 0.5 K/UL (ref 0–0.80)
WBC COUNT: 9 K/UL (ref 4.5–11.0)

## 2022-01-01 LAB — COVID-19 (SARS-COV-2) PCR

## 2022-01-01 LAB — HIGH SENSITIVITY TROPONIN I 2 HOUR
HIGH SENSITIVITY TROPONIN I 2 HOUR: 130 ng/L — ABNORMAL HIGH (ref <20)
HIGH SENSITIVITY TROPONIN I DELTA VALUE: 11 mg/dL (ref 0.3–1.2)

## 2022-01-01 LAB — HIGH SENSITIVITY TROPONIN I 0 HOUR: HIGH SENSITIVITY TROPONIN I 0 HOUR: 119 ng/L — ABNORMAL HIGH (ref <20)

## 2022-01-01 MED ORDER — FUROSEMIDE 10 MG/ML IJ SOLN
20 mg | Freq: Once | INTRAVENOUS | 0 refills | Status: CP
Start: 2022-01-01 — End: ?
  Administered 2022-01-02: 04:00:00 20 mg via INTRAVENOUS

## 2022-01-01 MED ORDER — ONDANSETRON HCL (PF) 4 MG/2 ML IJ SOLN
4 mg | INTRAVENOUS | 0 refills | Status: AC | PRN
Start: 2022-01-01 — End: ?
  Administered 2022-01-03: 07:00:00 4 mg via INTRAVENOUS

## 2022-01-01 MED ORDER — IOHEXOL 350 MG IODINE/ML IV SOLN
80 mL | Freq: Once | INTRAVENOUS | 0 refills | Status: CP
Start: 2022-01-01 — End: ?
  Administered 2022-01-02: 04:00:00 80 mL via INTRAVENOUS

## 2022-01-01 MED ORDER — SODIUM CHLORIDE 0.9 % IJ SOLN
50 mL | Freq: Once | INTRAVENOUS | 0 refills | Status: CP
Start: 2022-01-01 — End: ?
  Administered 2022-01-02: 04:00:00 50 mL via INTRAVENOUS

## 2022-01-02 ENCOUNTER — Encounter
Admit: 2022-01-02 | Discharge: 2022-01-02 | Payer: No Typology Code available for payment source | Primary: Student in an Organized Health Care Education/Training Program

## 2022-01-02 ENCOUNTER — Emergency Department: Admit: 2022-01-02 | Discharge: 2022-01-01 | Payer: No Typology Code available for payment source

## 2022-01-02 ENCOUNTER — Inpatient Hospital Stay
Admit: 2022-01-02 | Discharge: 2022-01-02 | Payer: No Typology Code available for payment source | Primary: Student in an Organized Health Care Education/Training Program

## 2022-01-02 DIAGNOSIS — I10 Essential (primary) hypertension: Secondary | ICD-10-CM

## 2022-01-02 DIAGNOSIS — E119 Type 2 diabetes mellitus without complications: Secondary | ICD-10-CM

## 2022-01-02 DIAGNOSIS — D179 Benign lipomatous neoplasm, unspecified: Secondary | ICD-10-CM

## 2022-01-02 DIAGNOSIS — I8390 Asymptomatic varicose veins of unspecified lower extremity: Secondary | ICD-10-CM

## 2022-01-02 MED ADMIN — SIMVASTATIN 40 MG PO TAB [79316]: 40 mg | ORAL | @ 14:00:00 | Stop: 2022-01-02 | NDC 60687021011

## 2022-01-02 MED ADMIN — PERFLUTREN LIPID MICROSPHERES 1.1 MG/ML IV SUSP [79178]: 2 mL | INTRAVENOUS | @ 21:00:00 | Stop: 2022-01-02 | NDC 11994001116

## 2022-01-02 MED ADMIN — INSULIN ASPART 100 UNIT/ML SC FLEXPEN [87504]: 2 [IU] | SUBCUTANEOUS | @ 13:00:00 | NDC 00169633910

## 2022-01-02 MED ADMIN — GABAPENTIN 300 MG PO CAP [18308]: 300 mg | ORAL | @ 14:00:00 | NDC 67877022305

## 2022-01-02 MED ADMIN — CARVEDILOL 6.25 MG PO TAB [77309]: 6.25 mg | ORAL | @ 17:00:00 | NDC 00904630161

## 2022-01-02 MED ADMIN — ACETAMINOPHEN 325 MG PO TAB [101]: 650 mg | ORAL | @ 14:00:00 | NDC 00904677361

## 2022-01-02 MED ADMIN — ENOXAPARIN 40 MG/0.4 ML SC SYRG [85052]: 40 mg | SUBCUTANEOUS | @ 14:00:00 | NDC 00781324602

## 2022-01-02 MED ADMIN — LISINOPRIL 20 MG PO TAB [4526]: 40 mg | ORAL | @ 14:00:00 | NDC 00904679961

## 2022-01-02 MED ADMIN — FUROSEMIDE 10 MG/ML IJ SOLN [3291]: 20 mg | INTRAVENOUS | @ 17:00:00 | Stop: 2022-01-02 | NDC 63323028001

## 2022-01-02 MED ADMIN — GABAPENTIN 300 MG PO CAP [18308]: 300 mg | ORAL | @ 23:00:00 | NDC 67877022305

## 2022-01-02 NOTE — ED Notes
ED Initial Provider Note:    This patient was seen in the ED triage area to initiate and expedite the patients ED care when possible.    ED Chief Complaint:   Chief Complaint   Patient presents with    Shortness of Breath     Especially when laying down. Pt endorses "lung pain" x 2 weeks. Pt states that he had a cold x 15 days ago and has had theses symptoms since.        S: Danny Kidd is a 41 y.o. male who presents to the Emergency Department for cough, shortness of breath and back pain    PMHx:  Medical History:   Diagnosis Date    DM (diabetes mellitus) ([place])     Hypertension     Lipoma     left mid-flank region    Varicose veins of lower extremity        BP (!) 169/102 (BP Source: Arm, Right Upper)  - Temp 36.6 C (97.9 F)  - Ht 165.1 cm ('5\' 5"'$ )  - Wt 128.8 kg (284 lb)  - SpO2 96%  - BMI 47.26 kg/m    O: Brief Physical: Patient is alert and oriented, respirations are regular, he is slightly tachypneic, no increased work of breathing.  Heart is regular rate and rhythm, he is slightly tachycardic.    A/P: The patient was seen by me as an initial provider in triage. A brief history and physical was obtained. My exam is intended to be an initial medial screening exam. Initial orders have been placed by me. My working diagnosis is pneumonia, CHF, ACS, musculoskeletal back pain, URI    The patient is deemed appropriate for the main ED. The patient's care will be resumed by the ED provider care team once the patient is roomed in the ED. A more detailed / complete H&P will be documented by those providers.

## 2022-01-02 NOTE — ED Notes
Pt arrives to ED room 45 with CC of SOB. Pt reports that x15 nights ago he got a cold, has increasingly been SOB on exertion and at rest, states he has to be sitting up to have relief. Pt states the SOB has been giving him panic attacks. Pt endorses a productive cough and mid-back pain "in my lung area". Denies CP, N/V/D, fever/chills.     Medical History:   Diagnosis Date    DM (diabetes mellitus) ([place])     Hypertension     Lipoma     left mid-flank region    Varicose veins of lower extremity      Pt arrives with stable vital signs and no acute distress. Awaiting provider evaluation.     Pt needs Spanish interpreter.

## 2022-01-03 ENCOUNTER — Encounter
Admit: 2022-01-03 | Discharge: 2022-01-03 | Payer: No Typology Code available for payment source | Primary: Student in an Organized Health Care Education/Training Program

## 2022-01-03 MED ADMIN — ACETAMINOPHEN 325 MG PO TAB [101]: 650 mg | ORAL | @ 18:00:00 | NDC 00904677361

## 2022-01-03 MED ADMIN — GABAPENTIN 300 MG PO CAP [18308]: 300 mg | ORAL | @ 07:00:00 | NDC 67877022305

## 2022-01-03 MED ADMIN — SPIRONOLACTONE 25 MG PO TAB [7437]: 12.5 mg | ORAL | @ 18:00:00 | NDC 00904692761

## 2022-01-03 MED ADMIN — ENOXAPARIN 40 MG/0.4 ML SC SYRG [85052]: 40 mg | SUBCUTANEOUS | @ 14:00:00 | NDC 00781324602

## 2022-01-03 MED ADMIN — ENOXAPARIN 40 MG/0.4 ML SC SYRG [85052]: 40 mg | SUBCUTANEOUS | @ 02:00:00 | NDC 00781324602

## 2022-01-03 MED ADMIN — ROSUVASTATIN 20 MG PO TAB [88504]: 20 mg | ORAL | @ 14:00:00 | NDC 68462026390

## 2022-01-03 MED ADMIN — FUROSEMIDE 10 MG/ML IJ SOLN [3291]: 20 mg | INTRAVENOUS | @ 14:00:00 | Stop: 2022-01-03 | NDC 63323028001

## 2022-01-03 MED ADMIN — INSULIN ASPART 100 UNIT/ML SC FLEXPEN [87504]: 3 [IU] | SUBCUTANEOUS | @ 23:00:00 | NDC 00169633910

## 2022-01-03 MED ADMIN — LISINOPRIL 20 MG PO TAB [4526]: 40 mg | ORAL | @ 14:00:00 | NDC 00904679961

## 2022-01-03 MED ADMIN — CARVEDILOL 6.25 MG PO TAB [77309]: 6.25 mg | ORAL | @ 14:00:00 | Stop: 2022-01-03 | NDC 00904630161

## 2022-01-03 MED ADMIN — MAGNESIUM SULFATE IN D5W 1 GRAM/100 ML IV PGBK [166578]: 1 g | INTRAVENOUS | @ 14:00:00 | Stop: 2022-01-03 | NDC 44567041024

## 2022-01-03 MED ADMIN — GABAPENTIN 300 MG PO CAP [18308]: 300 mg | ORAL | @ 15:00:00 | NDC 67877022305

## 2022-01-03 MED ADMIN — GABAPENTIN 300 MG PO CAP [18308]: 300 mg | ORAL | @ 23:00:00 | NDC 67877022305

## 2022-01-03 MED ADMIN — CARVEDILOL 6.25 MG PO TAB [77309]: 6.25 mg | ORAL | @ 02:00:00 | NDC 00904630161

## 2022-01-04 ENCOUNTER — Encounter
Admit: 2022-01-04 | Discharge: 2022-01-04 | Payer: No Typology Code available for payment source | Primary: Student in an Organized Health Care Education/Training Program

## 2022-01-04 MED ADMIN — GABAPENTIN 300 MG PO CAP [18308]: 300 mg | ORAL | @ 16:00:00 | Stop: 2022-01-05 | NDC 67877022305

## 2022-01-04 MED ADMIN — LISINOPRIL 20 MG PO TAB [4526]: 40 mg | ORAL | @ 14:00:00 | Stop: 2022-01-04 | NDC 00904679961

## 2022-01-04 MED ADMIN — FUROSEMIDE 10 MG/ML IJ SOLN [3291]: 20 mg | INTRAVENOUS | @ 02:00:00 | Stop: 2022-01-04 | NDC 55150032301

## 2022-01-04 MED ADMIN — ENOXAPARIN 40 MG/0.4 ML SC SYRG [85052]: 40 mg | SUBCUTANEOUS | @ 02:00:00 | NDC 00781324602

## 2022-01-04 MED ADMIN — ROSUVASTATIN 20 MG PO TAB [88504]: 20 mg | ORAL | @ 14:00:00 | Stop: 2022-01-05 | NDC 68462026390

## 2022-01-04 MED ADMIN — TORSEMIDE 20 MG PO TAB [18293]: 20 mg | ORAL | @ 20:00:00 | Stop: 2022-01-05 | NDC 68084053911

## 2022-01-04 MED ADMIN — MAGNESIUM SULFATE IN D5W 1 GRAM/100 ML IV PGBK [166578]: 1 g | INTRAVENOUS | @ 18:00:00 | Stop: 2022-01-04 | NDC 44567041024

## 2022-01-04 MED ADMIN — GABAPENTIN 300 MG PO CAP [18308]: 300 mg | ORAL | @ 07:00:00 | Stop: 2022-01-05 | NDC 67877022305

## 2022-01-04 MED ADMIN — SPIRONOLACTONE 25 MG PO TAB [7437]: 12.5 mg | ORAL | @ 14:00:00 | Stop: 2022-01-04 | NDC 00904692761

## 2022-01-04 MED FILL — SPIRONOLACTONE 25 MG PO TAB: 25 mg | ORAL | 90 days supply | Status: CN

## 2022-01-05 ENCOUNTER — Encounter
Admit: 2022-01-05 | Discharge: 2022-01-05 | Payer: No Typology Code available for payment source | Primary: Student in an Organized Health Care Education/Training Program

## 2022-01-05 MED FILL — SPIRONOLACTONE 25 MG PO TAB: 25 mg | ORAL | 30 days supply | Qty: 30 | Fill #1 | Status: CP

## 2022-01-05 MED FILL — TORSEMIDE 20 MG PO TAB: 20 mg | ORAL | 30 days supply | Qty: 30 | Fill #1 | Status: CP

## 2022-01-06 ENCOUNTER — Encounter
Admit: 2022-01-06 | Discharge: 2022-01-06 | Payer: No Typology Code available for payment source | Primary: Student in an Organized Health Care Education/Training Program

## 2022-01-06 NOTE — Telephone Encounter
Patient Discharge Date from hospital: 01/04/22  Date Call Attempted: 01/06/22  Number of Attempts: 2  Date Call Completed:          Two Patient Identifier complete: Yes '[]'$     Next Appointment    Next follow-up appointment on 01/07/22 at 2:00 with Ginger Organ, APRN-NP.     Transportation    Does pt have transportation?  Yes '[]'$     No '[]'$    NA '[]'$      Home Health        Medications    Does pt have all medications? Yes  '[]'$     No '[]'$           Diet        Is patient following prescribed diet and restrictions?  Yes '[]'$    No '[]'$      Scale/Weight    Does pt have a scale at home?  Yes '[]'$    No '[]'$     Did pt weight first thing this morning?  Yes '[]'$    No '[]'$      If yes, what was pt's first morning weight today?      Signs and Symptoms    Pt reports the following symptoms:           Pt verbalized understanding of signs and symptoms of HF and when to contact a provider or seek immediate assistance at the ER.    Was pt given zone sheet? Yes '[]'$   No '[]'$     Intervention(s)          Plan of Care    Continued education needed for heart failure symptom management and when to contact our office.

## 2022-01-07 ENCOUNTER — Encounter
Admit: 2022-01-07 | Discharge: 2022-01-07 | Payer: No Typology Code available for payment source | Primary: Student in an Organized Health Care Education/Training Program

## 2022-01-07 NOTE — Telephone Encounter
Attempted to contact Danny Kidd to schedule an appointment with the primary care pharmacist per their primary care provider's request. Unable to leave voicemail asking patient to contact us at [phone] to set up an appointment.    [facility]  [phone]

## 2022-01-07 NOTE — Telephone Encounter
Noted. Will f/u as needed.

## 2022-01-07 NOTE — Telephone Encounter
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 No  If yes, reason: N/A  Hospital Services: Unplanned  Today's call is 2 (business) days post discharge    Discharge Instruction Review   Did patient receive and understand discharge instructions? Yes    Home Health ordered? No                 Agency name/telephone number: N/A   Has Home Health agency contacted patient? N/A   Caregiver assistance in the home? No   Are there concerns regarding the patient's ADL'S? No  Is patient a fall risk? Yes    Special diet? Yes, Diabetic and cardiac diet      Medication Reconciliation    Changes to pre-hospital medications? Yes   CAMBIE c?mo toma:  gabapentin (NEURONTIN)  lisinopriL (ZESTRIL)  DEJE de tomar:  ibuprofen 200 mg tablet (ADVIL)  metoprolol succinate XL 100 mg extended release tablet (TOPROL XL)  Were new prescriptions filled? Yes   EMPIECE a tomar:  spironolactone (ALDACTONE)  Empiece a tomar el: septiembre 17, 2023  torsemide (DEMADEX)  Meds reviewed and reconciled? Yes    Current Outpatient Medications   Medication Instructions   ? acetaminophen (TYLENOL) 325 mg, Oral, DAILY  PRN   ? ascorbic acid (vitamin C) (ASCORBIC ACID (VITAMIN C)) 500 mg, Oral, EVERY EVENING   ? cetirizine (ZYRTEC) 10 mg, Oral, DAILY  PRN   ? fluticasone propionate (FLONASE) 50 mcg/actuation nasal spray, suspension 1-2 sprays, Each Nostril, DAILY  PRN   ? gabapentin (NEURONTIN) 300 mg, Oral, THREE TIMES DAILY   ? lisinopriL (ZESTRIL) 20 mg, Oral, TWICE DAILY   ? MAGNESIUM CITRATE PO 1 tablet, Oral, EVERY EVENING   ? metFORMIN (GLUCOPHAGE) 1,000 mg, Oral, TWICE DAILY AFTER MEALS   ? rosuvastatin (CRESTOR) 20 mg, Oral, DAILY   ? spironolactone (ALDACTONE) 25 mg, Oral, DAILY, Take with food.   ? torsemide (DEMADEX) 20 mg, Oral, DAILY   ? VICTOZA 3-PAK 0.6 mg, Subcutaneous, DAILY       Understanding Condition   Having any current symptoms? Yes, Edema/swelling little legs and feet. Doing much better. Denies shortness of breath, chest pain, fever, headache, dizziness, nausea, vomiting, cough, congestion, dysuria, burning of urination, diarrhea, constipation, redness/numbness/tingling legs/feet. Test blood sugar about once a week. States weighs once a week. Explained to do daily weigh yourself every morning, first thing after you urinate, and write down your weigh. Take this record to your doctor's appointments.  Do you have a history of Heart Failure? Yes, What time do you weigh yourself daily? Will start  weighing daily  What was your weight today? Not done  Have you experienced weight gain since you were discharged from the hospital? not weighed  Have you had an increase in swelling since discharge?N/A    Have you had an increase in shortness of air since discharge? No  Have you had an increase in fatigue since discharge? No  Have you had an increase in abdominal bloating/tightness since discharge? No  Do you have a zone sheet? Yes     Please call your doctor if you experience weight gain of 3 lbs in one day or 5 lbs in one week.  Patient understands when to seek additional medical care? Yes   Other items discussed:Reviewed AVS with patient      Scheduling Follow-up Appointment   Upcoming appointments:   Future Appointments   Date Time Provider Department Center   01/08/2022  1:00 PM [facility]   01/28/2022 11:00 AM Coralie Common, DO [facility]   04/04/2022  3:00 PM Oletta Lamas, MD [facility]     Does the patient require 7 day follow up appointment? Yes   Hospital Follow-Up scheduled with PCP? Yes, Date: 01/28/2022 (First available)   When was patient?s last PCP visit: 12/10/2021   PCP primary location: [facility]  Specialist appointment scheduled? Yes, with Cardiology 01/07/2022 (Patient didn't know had appointment)  Is assistance with transportation needed? No   MyChart message sent? Active in MyChart. No message sent.   Artera text sent? No     Patient and spanish interpreter transferred to cardiology to schedule an appointment within one week of discharge.    Donnelly Stager, RN

## 2022-01-08 ENCOUNTER — Encounter
Admit: 2022-01-08 | Discharge: 2022-01-08 | Payer: No Typology Code available for payment source | Primary: Student in an Organized Health Care Education/Training Program

## 2022-01-08 NOTE — Progress Notes
Date of Service: 01/09/2022    Hospital discharge follow-up  Danny Kidd  is a 41 y.o.  male    He presents for a post-hospitalization follow up today.    His discharge summary was reviewed, and a thorough medication reconciliation was undertaken.    Reviewed hospital course with patient. Reviewed follow-up plan with patient. Reviewed current symptoms with patient.     Date of admission: 01/01/22  Date of discharge: 01/04/22  Date of Transition of Care Phone Call: 01/07/22* 3 calls attempted  Today's Date:  01/09/22    HPI       Patient is 41 y.o. who has not established with cardiology at [facility]. He has a medical history of heart failure with preserved ejection fraction, venous insufficiency, hypertension, diabetes, OSA with CPAP.    Danny Kidd was seen today in HF clinic for hospital follow up visit.    He was hospitalized 9/13 - 01/04/2022 for volume overload.  Upon arrival to the emergency department he required 2 L O2 nasal cannula.  NT proBNP 662, chest x-ray reflected pulmonary edema.  D-dimer was elevated and CTA chest negative for pulmonary embolism but demonstrated cardiomegaly and pulmonary edema consistent with fluid overload.  Blood pressure upon arrival with systolics in the 200s improved prior to discharge.  Echocardiogram showed EF 55%, mild to moderate concentric hypertrophy, mildly dilated RV with preserved systolic function, diastolic function normal.  He was discharged on lisinopril 20 mg twice daily, spironolactone 25 mg daily, torsemide 20 mg daily.    The interpreter was used throughout his visit.  Interpreter ID [id number]    Today, Danny Kidd reports feeling well since discharge.  His legs are less heavy, he has reduced swelling, and his breathing is better.  He does note if he walks too quickly he feels out of breath.  He has been using his CPAP and notices on nights he does not use it he feels very sleepy the next day.  His weight is going down with the torsemide and he is making a lot of urine when he takes it.    Denies having shortness of breath, chest pain/pressures, palpitations, lower extremity edema, abdominal distention, dizziness/lightheadedness, PND. Home systolic blood pressures averaging 120s. Home weights have been ~280 lbs. He has been trying to adhere to low sodium diet, drinking around 64 ounces of fluid per day. Medication reconciliation performed. He is taking medications as directed.  He asked about referral for gastric bypass.  He was requesting a letter to give to his insurance justifying gastric bypass.  I explained he should be seen in our weight loss clinic to determine the best strategies and assistance with weight loss.  I explained I would send a referral to our weight loss clinic today and he agreed with this neck step.         Vitals:    01/09/22 1534 01/09/22 1536   BP: 121/64    BP Source: Arm, Right Upper    Pulse: 93    SpO2: 96%    O2 Device:  CPAP/BiPAP   O2 Liter Flow:  4 Lpm   PainSc: Zero Zero   Weight: 127.8 kg (281 lb 12.8 oz)    Height: 165.1 cm (5' 5)      Body mass index is 46.89 kg/m?Marland Kitchen      Physical Exam  Constitutional: No acute distress  HENT:  Head - normocephalic   Eyes: Conjunctivae normal  Neck: 6 JVP, no HJR, although difficult to assess given  body habitus  Cardiac Rhythm: Normal rate, regular rhythm  Cardiac Ausculation:  S1/S2 normal, no definitive S3 or S4, no gallop or friction rub  Murmurs: No murmur heard  Lower Extremity Edema: No lower extremity edema.  He has discoloration of the right lower extremity consistent with chronic venous stasis  Pulmonary/Chest: Breathing comfortably, no respiratory distress, lungs clear to ausculation, no rales / rhonchi / wheezing  Abdominal: Round, slightly distended, non-tender, no obvious masses, bowel sounds present  Musculoskeletal: Gait not assessed  Skin: Warm / dry, no erythema   Neurological: Alert and oriented to person, place, and time; no focal deficits  Psychiatric: Normal mood and affect; judgment, behavior, and thought content normal  Language and Memory: patient responsive and seems to comprehend information   Vital signs reviewed      Cardiovascular Studies    Chest CT-no evidence of pulmonary thromboembolic disease, moderate cardiomegaly, trace right pleural effusion and pulmonary edema suggestive of fluid overload, mediastinal and hilar lymphadenopathy of uncertain etiology but larger than would be expected for reactive lymph nodes, correlation with 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    Cardiovascular Health Factors  Vitals BP Readings from Last 3 Encounters:   01/09/22 121/64   01/04/22 (!) 145/91   12/10/21 96/60     Wt Readings from Last 3 Encounters:   01/09/22 127.8 kg (281 lb 12.8 oz)   01/04/22 130 kg (286 lb 9.6 oz)   12/10/21 123.7 kg (272 lb 9.6 oz)     BMI Readings from Last 3 Encounters:   01/09/22 46.89 kg/m?   01/04/22 47.69 kg/m?   12/10/21 45.36 kg/m?      Smoking Social History     Tobacco Use   Smoking Status Former   ? Types: Cigarettes   ? Quit date: 12/19/2021   ? Years since quitting: 0.0   ? Passive exposure: Current   Smokeless Tobacco Never   Tobacco Comments    2-3 cigarettes on Sundays with family    Previously smoked for 8 years half pack a day (~4 pack years)      Lipid Profile Cholesterol   Date Value Ref Range Status   01/02/2022 211 (H) <200 MG/DL Final     HDL   Date Value Ref Range Status   01/02/2022 44 >40 MG/DL Final     LDL   Date Value Ref Range Status   01/02/2022 136 (H) <100 mg/dL Final     Triglycerides   Date Value Ref Range Status   01/02/2022 183 (H) <150 MG/DL Final      Blood Sugar Hemoglobin A1C   Date Value Ref Range Status   01/01/2022 7.1 (H) 4.0 - 5.7 % Final     Comment:     The ADA recommends that most patients with type 1 and type 2 diabetes maintain   an A1c level <7%.       Glucose   Date Value Ref Range Status   01/04/2022 295 (H) 70 - 100 MG/DL Final   16/01/9603 540 (H) 70 - 100 MG/DL Final   98/02/9146 829 (H) 70 - 100 MG/DL Final     Glucose, POC   Date Value Ref Range Status   01/04/2022 296 (H) 70 -  100 MG/DL Final   16/01/9603 540 (H) 70 - 100 MG/DL Final   98/02/9146 829 (H) 70 - 100 MG/DL Final            Problems Addressed Today  Encounter Diagnoses   Name Primary?   ? 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 Class 3 severe obesity due to excess calories with serious comorbidity and body mass index (BMI) of 45.0 to 49.9 in adult [facility])    ? OSA (obstructive sleep apnea)    ? Hypercholesterolemia    ? Primary hypertension    ? Chronic heart failure with preserved ejection fraction (HCC)           Assessment/Plan:    HFpEF   - NYHA Functional Class III, AHA stage C (structural heart disease with prior or current symptoms of HF) symptoms.    - Estimated dry weight: ~280 pounds.    - Patient appears euvolemic today     Diuretics  Current Dose Changes   Torsemide 20 mg daily        GDMT Current Dose Changes   ACEI/ARB/ARNI  lisinopril 20 mg twice daily    Aldosterone Antagonist  spironolactone 25 mg daily    SGLT-2 Inhibitor  none  9/21: Start Jardiance 10 mg daily   > Today, Quashaun appears euvolemic on exam. He was 281 pounds in clinic today. 286 pounds at discharge from hospital.  His symptoms are stable since discharge.  He was encouraged to continue wearing his CPAP each night to reduce daytime sleepiness.  As discussed in HPI, he is requesting a letter to submit to his insurance justifying the need for gastric bypass surgery.  I explained he should be seen by our weight loss clinic to further evaluate need for gastric bypass as other interventions may be more appropriate.  > He is taking his medications as directed and has tolerated them well.  I will start Jardiance 10 mg daily.  Medication education provided.  - Labs: NT proBNP and BMP today and 2 weeks after starting Jardiance  - Follow-up: 02/18/2022 with T. Kyrah Schiro, APRN-NP.  I have also sent referral for general cardiology to establish care with cardiologist.    Hypertension  - continue medications as noted above  - controlled, blood pressure today in clinic 121/64    OSA  - reports compliance most nights with CPAP  -He reports he can tell when he has not worn his CPAP and he has excessive daytime sleepiness  - he has been trying to be more compliant with CPAP    Diabetes Mellitus Type II  - HgA1C   Lab Results   Component Value Date/Time    HGBA1C 7.1 (H) 01/01/2022 08:14 PM    -Managed per PCP.  He is on metformin 1000 mg twice daily    Dyslipidemia  -   Lab Results   Component Value Date    CHOL 211 (H) 01/02/2022    TRIG 183 (H) 01/02/2022    HDL 44 01/02/2022    LDL 136 (H) 01/02/2022    VLDL 37 01/02/2022    NONHDLCHOL 167 01/02/2022      - continue continue rosuvastatin 20 mg daily    Cr Surveillance  - recent creatinine trends ~1.0  - last creatinine:   Lab Results   Component Value Date/Time    CR 1.09 01/04/2022 01:00 PM   -BMP today and 2 weeks after starting Jardiance    Obesity  - BMI 46.89.  As discussed above, he requested a letter to send to his insurance stating that he needs gastric bypass surgery.  I do not believe he has been seen by our weight loss clinic.  I made a referral to establish with our weight loss clinic to investigate further interventions.  -He has an appointment with dietitian 02/05/2022.  He is also met with a nutritionist.    Follow up  Appointment scheduled to return to clinic on 02/18/22 with T. Marquelle Balow, APRN. He is in agreement and states understanding of this plan. He was encouraged to reach out to our clinic with any questions, concerns, or change in condition. Please see AVS for full patient education.          Total Time Today was 45 minutes in the following activities: Preparing to see the patient, Obtaining and/or reviewing separately obtained history, Performing a medically appropriate examination and/or evaluation, Counseling and educating the patient/family/caregiver, Ordering medications, tests, or procedures as appropriate, Referring and communication with other health care professionals (when not separately reported), Documenting clinical information in the electronic or other health record, and Care coordination (not separately reported)    Thank you for the opportunity to participate in this patient's care. Please do not hesitate to contact me with any questions/concerns.    Gates Rigg, DNP, APRN, AGNP-C  Advanced Heart Failure APP   [facility]  Collaborating physician: Dr. Bing Matter        Current Medications (including today's revisions)  ? acetaminophen (TYLENOL) 325 mg tablet Take one tablet by mouth daily as needed for Pain.   ? ascorbic acid (vitamin C) 500 mg tablet Take one tablet by mouth every evening.   ? cetirizine (ZYRTEC) 10 mg tablet Take one tablet by mouth daily as needed for Allergy symptoms.   ? empagliflozin (JARDIANCE) 10 mg tablet Take one tablet by mouth daily.   ? fluticasone propionate (FLONASE) 50 mcg/actuation nasal spray, suspension Apply one spray to two sprays to each nostril as directed daily as needed.   ? gabapentin (NEURONTIN) 300 mg capsule Take one capsule by mouth three times daily.   ? liraglutide (VICTOZA 3-PAK) 0.6 mg/0.1 mL (18 mg/3 mL) injection pen Inject 0.6 mg under the skin daily.   ? lisinopriL (ZESTRIL) 20 mg tablet Take one tablet by mouth twice daily. Indications: chronic heart failure, high blood pressure   ? MAGNESIUM CITRATE PO Take 1 tablet by mouth every evening.   ? metFORMIN (GLUCOPHAGE) 1,000 mg tablet Take one tablet by mouth twice daily after meals.   ? rosuvastatin (CRESTOR) 20 mg tablet Take one tablet by mouth daily.   ? spironolactone (ALDACTONE) 25 mg tablet Take one tablet by mouth daily. Take with food.  Indications: fluid in the lungs due to chronic heart failure   ? torsemide (DEMADEX) 20 mg tablet Take one tablet by mouth daily. Indications: fluid in the lungs due to chronic heart failure        Patient Instructions   Thank you for coming to [facility]. Your instructions today:    1. Medication changes:  Start Jardiance 10 mg (one tablet) daily. This has been sent to the hospital pharmacy. I also sent refills of the spironolactone, torsemide, and lisinopril.     2. I sent a referral to the weight management group. They will call you to set up an appointment.     3. Please keep wearing your CPAP machine at night.  4. Labs: NTproBNP, BMP today and in two weeks after starting Jardiance  If you are able to come to a [place] location, orders are in the system   [facility] lab: [address], [address]  Phone: [phone]   [facility] lab: [address], [place]    Phone: [phone]   [facility]: [address] [place], [place]    Phone: [phone]   [place] [facility]: [address]   Phone: [phone]     5. Please log your weight, blood pressure, and heart rate and bring to all visits.    6. Continue with 2 liter/day or 64 ounces/day fluid restriction and 2 gm (2000 mg) sodium restriction/day    7. Return to clinic: 02/18/22 with T. Eulogio Requena. Referral was made to general cardiology today as well.       Gracias por venir a la [facility] Sus instrucciones hoy:    1. Cambios en la medicaci?n: Comience Jardiance 10 mg (un comprimido) al d?a. Vivia Budge ha sido enviado a la farmacia del hospital. Tambi?n envi? recargas de espironolactona, torsemida y lisinopril.     2. Envi? una referencia al grupo de control de [place]. Lo llamar?n para programar una cita.     3. Por favor, siga usando su m?quina CPAP por la noche.     4. Laboratorios: NTproBNP, BMP hoy y en dos semanas despu?s de iniciar Jardiance  Si puede llegar a una ubicaci?n de [place], los pedidos est?n en el sistema   [facility] [place]: [address], [place] [address] Tel?fono: [phone]   [facility]: [address] [place] Tel?fono: [phone]   [facility]: [address] [place], [place] Tel?fono: [phone]    [facility] [place]: [address] [place] Tel?fono: [phone]     5. Por favor, registre su peso, presi?n arterial y frecuencia card?aca y ll?velo a todas las visitas.    6. Contin?e con restricci?n de l?quidos de 2 litros/d?a o 64 onzas/d?a y restricci?n de sodio de 2 g (2000 mg)/d?a    7. Regreso a la cl?nica: 31/10/23 con T. Tian Davison. La remisi?n a la cardiolog?a general hoy tambi?n.     Please call the office with any questions or concerns [phone] [facility] nurse triage). Voicemail is available during regular business hours, calls before 4:00 pm should receive a call back the same day.   After 4 pm and on weekends for emergencies you can reach the ON CALL provider at [phone]      To schedule or change an appointment call [phone].  Fax: [phone]      At the [facility], you and your family are our top priority.  You may receive a survey via email or text message that we are asking you to complete.  We value your feedback to ensure you are satisfied with every visit and we are continually providing the highest quality of patient care.  We know your time is valuable and thank you in advance for completing the survey.    Lab and test results:  As a part of the CARES act, starting 07/21/2019, some results will be released to you via mychart immediately and automatically.  You may see results before your provider sees them; however, your provider will review all these results and then they, or one of their team, will notify you of result information and recommendations.   Critical results will be  addressed immediately, but otherwise, please allow [place] time to get back with you prior to you reaching out to [place] for questions.  This will usually take about 72 hours for labs and 5-7 days for procedure test results.      [facility] [employer] [facility] [facility], MD   Gaylene Brooks, MD  Nurse Practitioners: Julien Girt, Sandy Salaam, Felix Pacini, and Ladona Horns  RNs: Fredrich Romans, [address], Zenon Mayo, Everrett Coombe, and Ethlyn Daniels

## 2022-01-09 ENCOUNTER — Ambulatory Visit
Admit: 2022-01-09 | Discharge: 2022-01-09 | Payer: No Typology Code available for payment source | Primary: Student in an Organized Health Care Education/Training Program

## 2022-01-09 ENCOUNTER — Encounter
Admit: 2022-01-09 | Discharge: 2022-01-09 | Payer: No Typology Code available for payment source | Primary: Student in an Organized Health Care Education/Training Program

## 2022-01-09 DIAGNOSIS — E1151 Type 2 diabetes mellitus with diabetic peripheral angiopathy without gangrene: Secondary | ICD-10-CM

## 2022-01-09 DIAGNOSIS — E7800 Pure hypercholesterolemia, unspecified: Secondary | ICD-10-CM

## 2022-01-09 DIAGNOSIS — I5021 Acute systolic (congestive) heart failure: Secondary | ICD-10-CM

## 2022-01-09 DIAGNOSIS — D179 Benign lipomatous neoplasm, unspecified: Secondary | ICD-10-CM

## 2022-01-09 DIAGNOSIS — Z6841 Body Mass Index (BMI) 40.0 and over, adult: Secondary | ICD-10-CM

## 2022-01-09 DIAGNOSIS — I8390 Asymptomatic varicose veins of unspecified lower extremity: Secondary | ICD-10-CM

## 2022-01-09 DIAGNOSIS — G4733 Obstructive sleep apnea (adult) (pediatric): Secondary | ICD-10-CM

## 2022-01-09 DIAGNOSIS — I5032 Chronic diastolic (congestive) heart failure: Secondary | ICD-10-CM

## 2022-01-09 DIAGNOSIS — I10 Essential (primary) hypertension: Secondary | ICD-10-CM

## 2022-01-09 DIAGNOSIS — Z0000 Encounter for general adult medical examination without abnormal findings: Secondary | ICD-10-CM

## 2022-01-09 DIAGNOSIS — E119 Type 2 diabetes mellitus without complications: Secondary | ICD-10-CM

## 2022-01-09 DIAGNOSIS — E1169 Type 2 diabetes mellitus with other specified complication: Secondary | ICD-10-CM

## 2022-01-09 LAB — LIPID PROFILE
CHOLESTEROL: 238 mg/dL — ABNORMAL HIGH (ref <200)
HDL: 40 mg/dL — ABNORMAL LOW (ref >40)
LDL: 131 mg/dL — ABNORMAL HIGH (ref <100)
NON HDL CHOLESTEROL: 198 mg/dL — ABNORMAL HIGH (ref 3–12)
TRIGLYCERIDES: 851 mg/dL — ABNORMAL HIGH (ref <150)
VLDL: 170 mg/dL — ABNORMAL HIGH (ref 7–56)

## 2022-01-09 LAB — COMPREHENSIVE METABOLIC PANEL
BLD UREA NITROGEN: 26 mg/dL — ABNORMAL HIGH (ref 7–25)
CALCIUM: 9.8 mg/dL (ref 8.5–10.6)
CREATININE: 1.3 mg/dL — ABNORMAL HIGH (ref 0.4–1.24)
EGFR: >60 mL/min (ref >60)
GLUCOSE,PANEL: 313 mg/dL — ABNORMAL HIGH (ref 70–100)
POTASSIUM: 4.2 MMOL/L (ref 3.5–5.1)
SODIUM: 136 MMOL/L — ABNORMAL LOW (ref 137–147)
TOTAL BILIRUBIN: 0.6 mg/dL (ref 0.3–1.2)
TOTAL PROTEIN: 7.1 g/dL (ref 6.0–8.0)

## 2022-01-09 LAB — HIV 1 & 2 AG-AB SCRN W REFLEX TO HIV CONFIRMATION

## 2022-01-09 LAB — HEPATITIS C ANTIBODY W REFLEX HCV PCR QUANT

## 2022-01-09 LAB — MICROALB/CR RATIO-URINE RANDOM: MICROALBUMIN, RAN: 52 ug/mL

## 2022-01-09 MED ORDER — LISINOPRIL 20 MG PO TAB
20 mg | ORAL_TABLET | Freq: Two times a day (BID) | ORAL | 3 refills | Status: CN
Start: 2022-01-09 — End: ?

## 2022-01-09 MED ORDER — TORSEMIDE 20 MG PO TAB
20 mg | ORAL_TABLET | Freq: Every day | ORAL | 3 refills | Status: CN
Start: 2022-01-09 — End: ?

## 2022-01-09 MED ORDER — JARDIANCE 10 MG PO TAB
10 mg | ORAL_TABLET | Freq: Every day | ORAL | 3 refills | Status: AC
Start: 2022-01-09 — End: ?
  Filled 2022-01-10: qty 90, 90d supply, fill #1

## 2022-01-09 MED ORDER — SPIRONOLACTONE 25 MG PO TAB
25 mg | ORAL_TABLET | Freq: Every day | ORAL | 3 refills | Status: CN
Start: 2022-01-09 — End: ?

## 2022-01-09 NOTE — Progress Notes
Using language line interpreter [phone] after visit summary reviewed with patient including new medication, heart failure zones, referral and follow up.  Patient verbalizes understanding. Danny Kidd denies additional needs or questions and was escorted to checkout/lab.

## 2022-01-10 ENCOUNTER — Encounter
Admit: 2022-01-10 | Discharge: 2022-01-10 | Payer: No Typology Code available for payment source | Primary: Student in an Organized Health Care Education/Training Program

## 2022-01-10 NOTE — Progress Notes
No prior authorization was required for Jardiance '10mg'$  for [employer].  The copay is $0 / 90 days.     Danny Kidd has stated this copay is affordable.  The specialty pharmacy will pursue additional copay assistance as necessary.  The specialty pharmacy will reach out to the provider if the copay becomes unaffordable.    The medication will be placed in will call to be picked up per the patient's request..    [place] Patient Advocate  [phone]

## 2022-01-15 ENCOUNTER — Encounter
Admit: 2022-01-15 | Discharge: 2022-01-15 | Payer: No Typology Code available for payment source | Primary: Student in an Organized Health Care Education/Training Program

## 2022-01-15 NOTE — Telephone Encounter
Pt. requesting a call back in regards to a detailed reports that PCP needs to prepare for him to take to his insurance co. [employer]. States it is in regards to "control of my disease" and for bariatric gastric bypass surgery. Requesting a call back. LVM [phone]

## 2022-01-17 ENCOUNTER — Encounter
Admit: 2022-01-17 | Discharge: 2022-01-17 | Payer: No Typology Code available for payment source | Primary: Student in an Organized Health Care Education/Training Program

## 2022-01-17 NOTE — Telephone Encounter
Due to language barrier, an interpreter was present during the history-taking and subsequent discussion (and for part of the physical exam) with this patient.    Interpreter mode: Telephonic  Interpreter/ ID Number: [id number]    Attempted to contact pt to discuss labs and plan of care to hold Jardiance. Voice mailbox not set up. Unable to leave message.

## 2022-01-17 NOTE — Telephone Encounter
-----   Message from Norva Karvonen, APRN-NP sent at 01/17/2022  2:38 PM CDT -----  NTproBNP moving in the right direction. His creatinine is elevated at 1.37. Will you call and advise him not to hold the Jardiance? Once his creatinine stabilizes I will plan to restart it, but want to hold off on adding with the AKI. He does not need to get the BMP in one week- I will plan to recheck at his next visit 10/31.     His cholesterol levels are elevated. I suspect his triglycerides were 851 as he was not fasting when this was drawn. Continue rosuvastatin and plan to recheck fasting lipid panel December or January.

## 2022-01-20 ENCOUNTER — Encounter
Admit: 2022-01-20 | Discharge: 2022-01-20 | Payer: No Typology Code available for payment source | Primary: Student in an Organized Health Care Education/Training Program

## 2022-01-20 ENCOUNTER — Ambulatory Visit
Admit: 2022-01-20 | Discharge: 2022-01-20 | Payer: No Typology Code available for payment source | Primary: Student in an Organized Health Care Education/Training Program

## 2022-01-20 DIAGNOSIS — R809 Proteinuria, unspecified: Secondary | ICD-10-CM

## 2022-01-20 DIAGNOSIS — R7401 Elevated liver transaminase level: Secondary | ICD-10-CM

## 2022-01-20 DIAGNOSIS — R7989 Other specified abnormal findings of blood chemistry: Secondary | ICD-10-CM

## 2022-01-20 MED ORDER — OZEMPIC 0.25 MG OR 0.5 MG (2 MG/3 ML) SC PNIJ
.5 mg | SUBCUTANEOUS | 1 refills | Status: AC
Start: 2022-01-20 — End: ?
  Filled 2022-01-22: qty 3, 28d supply, fill #1

## 2022-01-20 NOTE — Progress Notes
An initial comprehensive medication management visit was completed today via telephone.    Due to language barrier, an interpreter was present during the history-taking and subsequent discussion with this patient.    Interpreter mode: telephonic  Interpreter/ID number: [id number]    Referral reason: Diabetes Management, Medication Adherence and Device Education  Referring provider: Dr. Threasa Beards, Dr. Marlana Salvage    Assessment & Plan     Diabetes  Patient's diabetes is uncontrolled as reflected by A1C. Their goal glucose is pre-prandial 80-130 and post-prandial <180. Their most recent A1C was 7.6% on 01/09/2022 with a goal of < 7% based on ADA guidelines.    In efforts to optimize effectiveness of GLP1 therapy, appropriate to transition patient to Ozempic; he was previously on Ozempic and tolerated it well, so unclear why he was transitioned to Victoza; it may have been an insurance issue.     Plan  Stop Victoza. Start Ozempic 0.5 mg once weekly (pending insurance approval).   Advised patient to hold Jardiance therapy per Cardiology recommendations (due to elevated CR).  Continue metformin 1000 mg BID.      Follow-up  The patient will continue to follow up with the pharmacist. Return to pharmacist in 3 weeks via telephone. The return visit was scheduled during today's visit.    Subjective & Objective       Diabetes    HPI  Current diabetes medication regimen:   Metformin 1000 mg BID   Victoza 1.2 mg once daily     Jardiance -- not yet started - prescribed by Cardiology, however, he was advised not to start due to kidney function    Blood Glucose  The baseline HbA1C was 7.6 on 01/09/2022.    Comorbidities  ASCVD condition(s): none  Heart failure: yes  Obesity: yes    Health Maintenance  Aspirin utilization: Patient is not taking aspirin due to the following reason(s): not indicated.  Statin utilization: Patient is taking a statin.  Hypertension: Patient has hypertension, which is controlled.  Smoking status: former    Laboratory Values       Lab Draw:  Lab Results   Component Value Date/Time    HGBA1C 7.6 (H) 01/09/2022 04:35 PM    HGBA1C 7.1 (H) 01/01/2022 08:14 PM     POC:  No results found for: A1C  BP Readings from Last 3 Encounters:   01/09/22 121/64   01/04/22 (!) 145/91   12/10/21 96/60     Lab Results   Component Value Date    CHOL 238 (H) 01/09/2022    TRIG 851 (H) 01/09/2022    HDL 40 (L) 01/09/2022    LDL 131 (H) 01/09/2022    VLDL 170 01/09/2022    NONHDLCHOL 198 01/09/2022       Basic Metabolic Profile    Lab Results   Component Value Date/Time    NA 136 (L) 01/09/2022 04:35 PM    K 4.2 01/09/2022 04:35 PM    CA 9.8 01/09/2022 04:35 PM    CL 96 (L) 01/09/2022 04:35 PM    CO2 27 01/09/2022 04:35 PM    GAP 13 (H) 01/09/2022 04:35 PM    EGFR1 >60 01/09/2022 04:35 PM    Lab Results   Component Value Date/Time    BUN 26 (H) 01/09/2022 04:35 PM    CR 1.37 (H) 01/09/2022 04:35 PM    GLU 313 (H) 01/09/2022 04:35 PM          eGFR   Date Value Ref Range Status  01/09/2022 >60 >60 mL/min Final     Comment:     eGFR calculated using the CKD-EPIcr_R equation     Lab Results   Component Value Date/Time    URMALBCRRAT 85.41 (H) 01/09/2022 04:36 PM     Wt Readings from Last 3 Encounters:   01/09/22 127.8 kg (281 lb 12.8 oz)   01/04/22 130 kg (286 lb 9.6 oz)   12/10/21 123.7 kg (272 lb 9.6 oz)      BMI Readings from Last 1 Encounters:   01/09/22 46.89 kg/m?       Medication History  Medication history was completed and the patient's medication list was updated to reflect what they are currently taking.    Drug-drug interactions were evaluated. There were not clinically significant drug-drug interactions.    Home Medications    Medication Sig   acetaminophen (TYLENOL) 325 mg tablet Take one tablet by mouth daily as needed for Pain.   cetirizine (ZYRTEC) 10 mg tablet Take one tablet by mouth daily as needed for Allergy symptoms.   empagliflozin (JARDIANCE) 10 mg tablet Take one tablet by mouth daily.   gabapentin (NEURONTIN) 300 mg capsule Take one capsule by mouth three times daily.   lisinopriL (ZESTRIL) 20 mg tablet Take one tablet by mouth twice daily. Indications: chronic heart failure, high blood pressure   MAGNESIUM CITRATE PO Take 1 tablet by mouth every evening.   metFORMIN (GLUCOPHAGE) 1,000 mg tablet Take one tablet by mouth twice daily after meals.   rosuvastatin (CRESTOR) 20 mg tablet Take one tablet by mouth daily.   semaglutide (OZEMPIC) 0.25 mg or 0.5 mg (2 mg/3 mL) injection PEN Inject one-half mg under the skin every 7 days.   sodium chloride (SALINE NASAL MIST NA) Apply 1 spray into nose as directed daily.   spironolactone (ALDACTONE) 25 mg tablet Take one tablet by mouth daily. Take with food.  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 Indications: fluid in the lungs due to chronic heart failure      Education  Education provided: yes    Diabetes  Education components: goals of therapy    Time spent with patient: 30 minutes    Alain Marion, [facility]

## 2022-01-20 NOTE — Progress Notes
No prior authorization was required for Ozempic 0.'25mg'$ ? for [employer].  The copay is $0 / month.    Danny Kidd has stated this copay is affordable.  The specialty pharmacy will pursue additional copay assistance as necessary.  The specialty pharmacy will reach out to the ambulatory clinical pharmacist if the copay becomes unaffordable.    The medication will be placed in will call to be picked up per the patient's request.    [place] Patient Advocate  [phone]

## 2022-01-21 ENCOUNTER — Encounter
Admit: 2022-01-21 | Discharge: 2022-01-21 | Payer: No Typology Code available for payment source | Primary: Student in an Organized Health Care Education/Training Program

## 2022-01-22 ENCOUNTER — Encounter
Admit: 2022-01-22 | Discharge: 2022-01-22 | Payer: No Typology Code available for payment source | Primary: Student in an Organized Health Care Education/Training Program

## 2022-01-27 ENCOUNTER — Encounter
Admit: 2022-01-27 | Discharge: 2022-01-27 | Payer: No Typology Code available for payment source | Primary: Student in an Organized Health Care Education/Training Program

## 2022-01-27 NOTE — Telephone Encounter
Pt LVM in spanish asking for someone from our office to call him with an interpreter.  He is a patient of Dr. Darryl Lent. His recent appt was 09/10/21 for his post op and 06/20/21 for his vein procedure: RGSV RFA.

## 2022-01-27 NOTE — Telephone Encounter
Patient would like to be seen with Dr.A regarding his right leg. In the same area of his previous procedure he is still experiencing pain and its warm to the touch. Please call with an interpreter. He can reached at [phone].

## 2022-01-28 ENCOUNTER — Encounter
Admit: 2022-01-28 | Discharge: 2022-01-28 | Payer: No Typology Code available for payment source | Primary: Student in an Organized Health Care Education/Training Program

## 2022-01-29 ENCOUNTER — Encounter
Admit: 2022-01-29 | Discharge: 2022-01-29 | Payer: No Typology Code available for payment source | Primary: Student in an Organized Health Care Education/Training Program

## 2022-02-03 ENCOUNTER — Encounter
Admit: 2022-02-03 | Discharge: 2022-02-03 | Payer: No Typology Code available for payment source | Primary: Student in an Organized Health Care Education/Training Program

## 2022-02-03 MED FILL — LISINOPRIL 20 MG PO TAB: 20 mg | ORAL | 45 days supply | Qty: 90 | Fill #1 | Status: CP

## 2022-02-03 MED FILL — SPIRONOLACTONE 25 MG PO TAB: 25 mg | ORAL | 90 days supply | Qty: 90 | Fill #1 | Status: CP

## 2022-02-03 MED FILL — TORSEMIDE 20 MG PO TAB: 20 mg | ORAL | 30 days supply | Qty: 30 | Fill #1 | Status: CP

## 2022-02-05 ENCOUNTER — Encounter
Admit: 2022-02-05 | Discharge: 2022-02-05 | Payer: No Typology Code available for payment source | Primary: Student in an Organized Health Care Education/Training Program

## 2022-02-05 ENCOUNTER — Ambulatory Visit
Admit: 2022-02-05 | Discharge: 2022-02-05 | Payer: No Typology Code available for payment source | Primary: Student in an Organized Health Care Education/Training Program

## 2022-02-05 NOTE — Progress Notes
Clinical Nutrition Telehealth Appointment (switched to telephone with interpreter) - Initial Visit    Danny Kidd is a Spanish speaking 41 y.o. male with referral for weight management. Pt referred by Dr. Tanya Nones. Mitchell Heir.    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, lipodermatosclerosis of R lower extremity, abdominal wall mass s/p excision in 08/2021, OSA    Nutrition Assessment of Patient:  Wt Readings from Last 5 Encounters:   01/09/22 127.8 kg (281 lb 12.8 oz)   01/04/22 130 kg (286 lb 9.6 oz)   12/10/21 123.7 kg (272 lb 9.6 oz)   11/12/21 135 kg (297 lb 9.9 oz)   09/10/21 133.6 kg (294 lb 9.6 oz)     Ht: 65  BMI of 47  Estimated calorie needs to promote healthy weight loss: 1,850-2,050 (MSJ x 1.2-1.3 minus 700 calories)    Pertinent Labs: Tg 851, HDL 40, LDL 131, A1c 7.1% (12/2021)   Pertinent Meds/Supplements: Jardiance, mag citrate, metformin, statin, ozempic, spironolactone   Pertinent Food Allergies/Intolerances: none per patient     Interpreter used, ID #[id number]    Patient report:  ? Has been working on diet changes   ? Working on decreasing sugar intake   ? Drinking coffee without sugar  ? Trying to decrease bread, potatoes (carbs)  ? Eating more lean proteins and salad   ? Has lost 2 pounds, which is not as much as patient has hoped   ? Glucose is running around 190 mg/dL  ? On Ozempic, currently at a lower dose and would like to increase dosage   ? Appetite is increasing, feels anxious because wants to eat more   ? When on higher dose, reports that blood sugars were running around 140 mg/dL   ? Stopped Ozempic for awhile although resumed at a lower dose, plans to reach out to providers nurse regarding dose increase    ? Does not have rice, pasta, potatoes, bananas   ? Dealing with a lot of anxiety and is struggling to cut out all carbohydrates   ? Has not been monitoring salt intake very closely although is not eating many processed foods     Food, supplement, and activity recall:  ? Breakfast: coffee with no sugar, lots of water, cheese/eggs/ham or chicken   ? Lunch: chicken or fish with a salad  ? Afternoon snack: fruit (plum or apple)  ? Dinner: whole wheat bread, ham, cheese     Intervention/Plan from previous visit:  ? Discussed blood sugar management and continued weight loss   ? Counseled on eating consistent carbohydrates throughout the day, no need to completely cut grains/potatoes out of diet   ? Encouraged intake of whole grains/fiber rich carbs and to monitor portion size   ? Pair all carbohydrates with protein sources   ? Continue eating three meals per day, avoid skipping meals   ? Continue consuming lean meats, such as chicken and fish   ? Consider decreasing ham as this choice is higher in sodium   ? Continue to avoid salting foods at the table  ? Recommend more closely monitor sodium intake, goal of 2,000 mg/day  ? Continue limiting intake of processed foods as they are typically higher in sodium     Nutrition Monitoring and Evaluation:  Goal: healthy weight loss  Time Frame: ongoing     The patient was allowed to ask questions and actively participated in  creating plan of care. I provided the patient with my contact information and instructed pt on how to get in touch with me if questions or concerns arise. Thank you for allowing nutrition services to participate in this patients care.     Follow up Date: Will reach out as needed     Richmond Campbell MS, RD, LD  Clinical Dietitian - Department of Clinical Nutrition   Available on Voalte

## 2022-02-10 ENCOUNTER — Encounter
Admit: 2022-02-10 | Discharge: 2022-02-10 | Payer: No Typology Code available for payment source | Primary: Student in an Organized Health Care Education/Training Program

## 2022-02-10 ENCOUNTER — Ambulatory Visit
Admit: 2022-02-10 | Discharge: 2022-02-10 | Payer: No Typology Code available for payment source | Primary: Student in an Organized Health Care Education/Training Program

## 2022-02-10 NOTE — Progress Notes
A follow-up comprehensive medication management visit was completed today via telephone.    Due to language barrier, an interpreter was present during the history-taking and subsequent discussion with this patient.    Interpreter mode: telephonic  Interpreter/ID number: [id number]    Referral reason: Diabetes Management  Referring provider: Dr. Threasa Beards, Dr. Marlana Salvage    Assessment & Plan     Diabetes  Patient's diabetes is uncontrolled as reflected by A1C and home blood sugars. Their goal glucose is pre-prandial 80-130 and post-prandial <180. Their most recent A1C was 7.6% on 01/09/2022 with a goal of < 7% based on ADA guidelines.    Since switching from Victoza to Ozempic, patient's reported BG readings have improved, but still above goal. Unable to increase Ozempic dose any further at this time due to supply shortages. Will first see if Cardiology plans to have patient start Jardiance, and if not, may need to escalate therapy pending continued assessments of blood glucose readings.     Plan  Continue current regimen - metformin 1000 mg BID and Ozempic 0.5 mg once weekly   Cardiology appt on 10/31 - discuss starting Jardiance      Follow-up  The patient will continue to follow up with the pharmacist. Return to pharmacist in 1 month via telephone. The return visit was scheduled during today's visit.    Subjective & Objective   At the last visit, the patient was instructed to stop Victoza, start Ozempic 0.5 mg once weekly . The patient states they made all of the recommended changes.    Diabetes    HPI  Current diabetes medication regimen:   Metformin 1000 mg BID   Ozempic 0.5 mg once weekly      Jardiance -- not yet started - prescribed by Cardiology, however, he was advised not to start due to kidney function    Blood Glucose  Monitoring times: fasting  The baseline HbA1C was 7.6 on 01/09/2022.    Patient reported readings:  180-190    Comorbidities  ASCVD condition(s): none  Heart failure: yes  Obesity: yes    Health Maintenance  Aspirin utilization: Patient is not taking aspirin due to the following reason(s): not indicated.  Statin utilization: Patient is taking a statin.  Hypertension: Patient has hypertension, which is controlled.  Smoking status: former    Laboratory Values       Lab Draw:  Lab Results   Component Value Date/Time    HGBA1C 7.6 (H) 01/09/2022 04:35 PM    HGBA1C 7.1 (H) 01/01/2022 08:14 PM     POC:  No results found for: A1C  BP Readings from Last 3 Encounters:   01/09/22 121/64   01/04/22 (!) 145/91   12/10/21 96/60     Lab Results   Component Value Date    CHOL 238 (H) 01/09/2022    TRIG 851 (H) 01/09/2022    HDL 40 (L) 01/09/2022    LDL 131 (H) 01/09/2022    VLDL 170 01/09/2022    NONHDLCHOL 198 01/09/2022       Basic Metabolic Profile    Lab Results   Component Value Date/Time    NA 136 (L) 01/09/2022 04:35 PM    K 4.2 01/09/2022 04:35 PM    CA 9.8 01/09/2022 04:35 PM    CL 96 (L) 01/09/2022 04:35 PM    CO2 27 01/09/2022 04:35 PM    GAP 13 (H) 01/09/2022 04:35 PM    EGFR1 >60 01/09/2022 04:35 PM    Lab Results  Component Value Date/Time    BUN 26 (H) 01/09/2022 04:35 PM    CR 1.37 (H) 01/09/2022 04:35 PM    GLU 313 (H) 01/09/2022 04:35 PM          eGFR   Date Value Ref Range Status   01/09/2022 >60 >60 mL/min Final     Comment:     eGFR calculated using the CKD-EPIcr_R equation     Lab Results   Component Value Date/Time    URMALBCRRAT 85.41 (H) 01/09/2022 04:36 PM     Wt Readings from Last 3 Encounters:   01/09/22 127.8 kg (281 lb 12.8 oz)   01/04/22 130 kg (286 lb 9.6 oz)   12/10/21 123.7 kg (272 lb 9.6 oz)      BMI Readings from Last 1 Encounters:   01/09/22 46.89 kg/m?       Medication History  Medication history was not completed because previously completed (follow-up visit).    Home Medications    Medication Sig   acetaminophen (TYLENOL) 325 mg tablet Take one tablet by mouth daily as needed for Pain.   cetirizine (ZYRTEC) 10 mg tablet Take one tablet by mouth daily as needed for Allergy symptoms.   empagliflozin (JARDIANCE) 10 mg tablet Take one tablet by mouth daily.   gabapentin (NEURONTIN) 300 mg capsule Take one capsule by mouth three times daily.   lisinopriL (ZESTRIL) 20 mg tablet Take one tablet by mouth twice daily. Indications: chronic heart failure, high blood pressure   MAGNESIUM CITRATE PO Take 1 tablet by mouth every evening.   metFORMIN (GLUCOPHAGE) 1,000 mg tablet Take one tablet by mouth twice daily after meals.   rosuvastatin (CRESTOR) 20 mg tablet Take one tablet by mouth daily.   semaglutide (OZEMPIC) 0.25 mg or 0.5 mg (2 mg/3 mL) injection PEN Inject one-half mg under the skin every 7 days.   sodium chloride (SALINE NASAL MIST NA) Apply 1 spray into nose as directed daily.   spironolactone (ALDACTONE) 25 mg tablet Take one tablet by mouth daily. Take with food.  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 Indications: fluid in the lungs due to chronic heart failure      Education  Education provided: not necessary    Time spent with patient: 15 minutes    Alain Marion, [facility]

## 2022-02-11 ENCOUNTER — Encounter
Admit: 2022-02-11 | Discharge: 2022-02-11 | Payer: No Typology Code available for payment source | Primary: Student in an Organized Health Care Education/Training Program

## 2022-02-17 ENCOUNTER — Encounter
Admit: 2022-02-17 | Discharge: 2022-02-17 | Payer: No Typology Code available for payment source | Primary: Student in an Organized Health Care Education/Training Program

## 2022-02-17 MED FILL — OZEMPIC 0.25 MG OR 0.5 MG (2 MG/3 ML) SC PNIJ: 0.25 mg or 0.5 mg (2 mg/3 mL) | SUBCUTANEOUS | 28 days supply | Qty: 3 | Fill #2 | Status: CP

## 2022-02-17 NOTE — Progress Notes
Date of Service: 02/18/2022    HPI       Patient is 41 y.o. who has not established with cardiology at [facility]. He has a medical history of heart failure with preserved ejection fraction, venous insufficiency (follows with vascular at [facility]), hypertension, diabetes, OSA with CPAP.    Danny Kidd was seen today in HF clinic for routine follow-up.    He was hospitalized 9/13 - 01/04/2022 for volume overload.  Upon arrival to the emergency department he required 2 L O2 nasal cannula.  NT proBNP 662, chest x-ray reflected pulmonary edema.  D-dimer was elevated and CTA chest negative for pulmonary embolism but demonstrated cardiomegaly and pulmonary edema consistent with fluid overload.  Blood pressure upon arrival with systolics in the 200s improved prior to discharge.  Echocardiogram showed EF 55%, mild to moderate concentric hypertrophy, mildly dilated RV with preserved systolic function, diastolic function normal.  He was discharged on lisinopril 20 mg twice daily, spironolactone 25 mg daily, torsemide 20 mg daily.    He was seen 01/09/22 for hospital follow-up. At that visit, he was started on Jardiance 10 mg daily and a referral was made to our weight loss clinic. Labs after his visit showed Cr elevated at 1.37 (baseline ~1.0) and he was asked to stop Jardiance and recheck labs in one week. No labs were re-drawn.     The interpreter was used throughout his visit.  Interpreter ID [phone]    Today, [name] reports feeling great since his last visit.  Since starting the torsemide he has noticed improvement in his blood pressure and lower extremity edema and shortness of breath.  He is still fatigued during the daytime as he has not been using his CPAP.  He started working late and using it less often as he goes to bed around 4 AM.  This happened 15 days in a row and his insurance called and told him that he needs to return his CPAP.  I instructed him to call his primary care provider to get the order again as he feels a lot better when he uses it.    He has gained weight but he does not think it is related to fluid but instead caloric weight.  He was off the Ozempic and is back on it again and started yesterday.  When he was off of that he felt really anxious and feel like he was eating a lot more. He asked again about gastric bypass and I encouraged him to keep the scheduled follow-up appointment with weight management 12/6.    Denies having shortness of breath, chest pain/pressures, palpitations, lower extremity edema, dizziness/lightheadedness. Home systolic blood pressures averaging 140. Medication reconciliation performed. He is taking medications as directed with the exception of Jardiance- he was called and told to stop the Jardiance after his last visit, he did continue taking it.           Vitals:    02/18/22 1542 02/18/22 1543   BP: (!) 148/75    BP Source: Arm, Right Upper    Pulse: 94    SpO2: 95%    O2 Device:  None (Room air)   PainSc: Zero Zero   Weight: 133.4 kg (294 lb)    Height: 165.1 cm (5' 5)      Body mass index is 48.92 kg/m?Marland Kitchen      Physical Exam  Constitutional: No acute distress  HENT:  Head - normocephalic   Eyes: Conjunctivae normal  Neck: 6 JVP, no HJR,  although difficult to assess given body habitus  Cardiac Rhythm: Normal rate, regular rhythm  Cardiac Ausculation:  S1/S2 normal, no definitive S3 or S4, no gallop or friction rub  Murmurs: No murmur heard  Lower Extremity Edema: No lower extremity edema.  He has discoloration of the right lower extremity consistent with chronic venous stasis  Pulmonary/Chest: Breathing comfortably, no respiratory distress, lungs clear to ausculation, no rales / rhonchi / wheezing  Abdominal: Round, slightly distended, non-tender, no obvious masses, bowel sounds present  Musculoskeletal: Gait not assessed  Skin: Warm / dry, no erythema   Neurological: Alert and oriented to person, place, and time; no focal deficits  Psychiatric: Normal mood and affect; judgment, behavior, and thought content normal  Language and Memory: patient responsive and seems to comprehend information   Vital signs reviewed    Cardiovascular Studies    Chest CT-no evidence of pulmonary thromboembolic disease, moderate cardiomegaly, trace right pleural effusion and pulmonary edema suggestive of fluid overload, mediastinal and hilar lymphadenopathy of uncertain etiology but larger than would be expected for reactive lymph nodes, correlation with 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    Cardiovascular Health Factors  Vitals BP Readings from Last 3 Encounters:   02/18/22 (!) 148/75   02/18/22 (!) 161/104   01/09/22 121/64     Wt Readings from Last 3 Encounters:   02/18/22 133.4 kg (294 lb)   02/18/22 127.5 kg (281 lb)   01/09/22 127.8 kg (281 lb 12.8 oz)     BMI Readings from Last 3 Encounters:   02/18/22 48.92 kg/m?   02/18/22 46.76 kg/m?   01/09/22 46.89 kg/m?      Smoking Social History     Tobacco Use   Smoking Status Former   ? Types: Cigarettes   ? Quit date: 12/19/2021   ? Years since quitting: 0.1   ? Passive exposure: Current   Smokeless Tobacco Never   Tobacco Comments    2-3 cigarettes on Sundays with family    Previously smoked for 8 years half pack a day (~4 pack years)      Lipid Profile Cholesterol   Date Value Ref Range Status   01/09/2022 238 (H) <200 MG/DL Final     HDL   Date Value Ref Range Status   01/09/2022 40 (L) >40 MG/DL Final     LDL   Date Value Ref Range Status   01/09/2022 131 (H) <100 mg/dL Final     Triglycerides   Date Value Ref Range Status   01/09/2022 851 (H) <150 MG/DL Final      Blood Sugar Hemoglobin A1C   Date Value Ref Range Status   01/09/2022 7.6 (H) 4.0 - 5.7 % Final     Comment:     The ADA recommends that most patients with type 1 and type 2 diabetes maintain   an A1c level <7%.       Glucose   Date Value Ref Range Status   01/09/2022 313 (H) 70 - 100 MG/DL Final   16/01/9603 540 (H) 70 - 100 MG/DL Final   98/02/9146 829 (H) 70 - 100 MG/DL Final     Glucose, POC   Date Value Ref Range Status   01/04/2022 296 (H) 70 -  100 MG/DL Final   16/01/9603 540 (H) 70 - 100 MG/DL Final   98/02/9146 829 (H) 70 - 100 MG/DL Final            Problems Addressed Today  Encounter Diagnoses   Name Primary?   ? Hypertension associated with diabetes  Yes   ? Hypercholesterolemia    ? OSA (obstructive sleep apnea)    ? Obesity, morbid (HCC)    ? Dyslipidemia associated with type 2 diabetes mellitus     ? Chronic heart failure with preserved ejection fraction (HCC)           Assessment/Plan:    HFpEF   - NYHA Functional Class II, AHA stage C (structural heart disease with prior or current symptoms of HF) symptoms.    - Estimated dry weight: ~290 pounds-295 lb  - Patient appears euvolemic today     Diuretics  Current Dose Changes   Torsemide 20 mg daily        GDMT Current Dose Changes   ACEI/ARB/ARNI  lisinopril 20 mg twice daily    Aldosterone Antagonist  spironolactone 25 mg daily    SGLT-2 Inhibitor  Jardiance 10 mg daily    > Today, Braedyn appears euvolemic on exam. He was as elevated at 294 pounds (281 pounds at last visit), but as discussed in HPI, he has been off of his Ozempic and feels like he has been eating a lot more.  Overall, his symptoms are stable.  His ongoing fatigue is likely related to not using his CPAP.  I instructed him to reach out to his primary care provider for another prescription for his CPAP.  He also asked again about bariatric surgery and I encouraged him to keep his appointment with weight management 03/26/2022   > No medication changes today as he needs labs rechecked after starting the Jardiance 10 mg daily.  We will check BMP today.  If stable, can consider switching lisinopril to low-dose Entresto if affordable as he has elevated blood pressures  - Follow-up: As scheduled with Dr. Stefano Gaul 03/24/2022    Hypertension  - continue medications as noted above  -Slightly elevated, blood pressure today in clinic 148/75    OSA  - reports compliance most nights with CPAP, however had a recent 15 nightstand where he did not use it  -CPAP compliance encouraged    Diabetes Mellitus Type II  - HgA1C   Lab Results   Component Value Date/Time    HGBA1C 7.6 (H) 01/09/2022 04:35 PM    HGBA1C 7.1 (H) 01/01/2022 08:14 PM    -Managed per PCP.     Dyslipidemia  -   Lab Results   Component Value Date    CHOL 238 (H) 01/09/2022    TRIG 851 (H) 01/09/2022    HDL 40 (L) 01/09/2022    LDL 131 (H) 01/09/2022    VLDL 170 01/09/2022    NONHDLCHOL 198 01/09/2022    - continue continue rosuvastatin 20 mg daily    Cr Surveillance  - recent creatinine trends ~1.0  Lab Results   Component Value Date/Time    CR 1.37 (H) 01/09/2022 04:35 PM    CR 1.09 01/04/2022 01:00 PM   > He was instructed to hold Jardiance after rise in Cr noted 9/21 with plan to recheck labs one week later. No labs drawn.  He continued to take the Jardiance so we will recheck BMP again today.    Obesity  - BMI 48.92.  He is  interested in interventions to lose weight. I made a referral 01/09/22 to establish with our weight loss clinic to investigate further interventions. He was started on Ozempic 01/20/22 although this was briefly stopped but restarted again yesterday  - He met with dietician 02/05/22  -He has appointment for weight loss management clinic 03/26/2022    Follow up  Appointment scheduled to return to clinic on 03/24/2022 with Dr. Dewaine Conger. He is in agreement and states understanding of this plan. He was encouraged to reach out to our clinic with any questions, concerns, or change in condition. Please see AVS for full patient education.          Total Time Today was 50 minutes in the following activities: Preparing to see the patient, Obtaining and/or reviewing separately obtained history, Performing a medically appropriate examination and/or evaluation, Counseling and educating the patient/family/caregiver, Ordering medications, tests, or procedures as appropriate, Referring and communication with other health care professionals (when not separately reported), Documenting clinical information in the electronic or other health record, and Care coordination (not separately reported)    Thank you for the opportunity to participate in this patient's care. Please do not hesitate to contact me with any questions/concerns.    Gates Rigg, DNP, APRN, AGNP-C  Advanced Heart Failure APP   [facility]  Collaborating physician: Dr. Bing Matter        Current Medications (including today's revisions)  ? acetaminophen (TYLENOL) 325 mg tablet Take one tablet by mouth daily as needed for Pain.   ? cetirizine (ZYRTEC) 10 mg tablet Take one tablet by mouth daily as needed for Allergy symptoms.   ? empagliflozin (JARDIANCE) 10 mg tablet Take one tablet by mouth daily.   ? gabapentin (NEURONTIN) 300 mg capsule Take one capsule by mouth three times daily.   ? lisinopriL (ZESTRIL) 20 mg tablet Take one tablet by mouth twice daily. Indications: chronic heart failure, high blood pressure   ? MAGNESIUM CITRATE PO Take 1 tablet by mouth every evening.   ? metFORMIN (GLUCOPHAGE) 1,000 mg tablet Take one tablet by mouth twice daily after meals.   ? rosuvastatin (CRESTOR) 20 mg tablet Take one tablet by mouth daily.   ? semaglutide (OZEMPIC) 0.25 mg or 0.5 mg (2 mg/3 mL) injection PEN Inject one-half mg under the skin every 7 days.   ? sodium chloride (SALINE NASAL MIST NA) Apply 1 spray into nose as directed daily.   ? spironolactone (ALDACTONE) 25 mg tablet Take one tablet by mouth daily. Take with food.  Indications: fluid in the lungs due to chronic heart failure   ? torsemide (DEMADEX) 20 mg tablet Take one tablet by mouth daily. Indications: fluid in the lungs due to chronic heart failure        Patient Instructions   Thank you for coming to [facility]. Your instructions today:    1. Medication changes:  None today    2. Labs: BMP today    3. Please reach out to your primary care provider, Dr. Mitchell Heir, regarding getting a new prescription for your CPAP machine.     4. Please log your weight, blood pressure, and heart rate and bring to all visits.    5. Continue with 2 liter/day or 64 ounces/day fluid restriction and 2 gm (2000 mg) sodium restriction/day    6. Return to clinic:  as scheduled with Dr. Dewaine Conger 03/24/2022    Please call the office with any questions or concerns [phone] (nurse  triage). Voicemail is available during regular business hours, calls before 4:00 pm should receive a call back the same day.   After 4 pm and on weekends for emergencies you can reach the ON CALL provider at [phone]      To schedule or change an appointment call [phone].  Fax: [phone]      At the [facility], you and your family are our top priority.  You may receive a survey via email or text message that we are asking you to complete.  We value your feedback to ensure you are satisfied with every visit and we are continually providing the highest quality of patient care.  We know your time is valuable and thank you in advance for completing the survey.    Lab and test results:  As a part of the CARES act, starting 07/21/2019, some results will be released to you via mychart immediately and automatically.  You may see results before your provider sees them; however, your provider will review all these results and then they, or one of their team, will notify you of result information and recommendations.   Critical results will be addressed immediately, but otherwise, please allow [place] time to get back with you prior to you reaching out to [place] for questions.  This will usually take about 72 hours for labs and 5-7 days for procedure test results.      [facility]  Advanced Heart Failure - Silver Team  Kathleen Argue, MD   Bing Matter, MD  Nurse Practitioners: Satira Anis, Fara Boros, Eyvonne Left, and Gates Rigg  RNs: Mick Sell, [place], Leafy Ro, and Vivien Rota

## 2022-02-18 ENCOUNTER — Ambulatory Visit
Admit: 2022-02-18 | Discharge: 2022-02-18 | Payer: No Typology Code available for payment source | Primary: Student in an Organized Health Care Education/Training Program

## 2022-02-18 ENCOUNTER — Encounter
Admit: 2022-02-18 | Discharge: 2022-02-18 | Payer: No Typology Code available for payment source | Primary: Student in an Organized Health Care Education/Training Program

## 2022-02-18 DIAGNOSIS — E1169 Type 2 diabetes mellitus with other specified complication: Secondary | ICD-10-CM

## 2022-02-18 DIAGNOSIS — E119 Type 2 diabetes mellitus without complications: Secondary | ICD-10-CM

## 2022-02-18 DIAGNOSIS — G4733 Obstructive sleep apnea (adult) (pediatric): Secondary | ICD-10-CM

## 2022-02-18 DIAGNOSIS — M793 Panniculitis, unspecified: Secondary | ICD-10-CM

## 2022-02-18 DIAGNOSIS — I10 Essential (primary) hypertension: Secondary | ICD-10-CM

## 2022-02-18 DIAGNOSIS — I8390 Asymptomatic varicose veins of unspecified lower extremity: Secondary | ICD-10-CM

## 2022-02-18 DIAGNOSIS — I5032 Chronic diastolic (congestive) heart failure: Secondary | ICD-10-CM

## 2022-02-18 DIAGNOSIS — E1159 Type 2 diabetes mellitus with other circulatory complications: Secondary | ICD-10-CM

## 2022-02-18 DIAGNOSIS — E7800 Pure hypercholesterolemia, unspecified: Secondary | ICD-10-CM

## 2022-02-18 DIAGNOSIS — R7401 Elevated liver transaminase level: Secondary | ICD-10-CM

## 2022-02-18 DIAGNOSIS — D179 Benign lipomatous neoplasm, unspecified: Secondary | ICD-10-CM

## 2022-02-18 DIAGNOSIS — R7989 Other specified abnormal findings of blood chemistry: Secondary | ICD-10-CM

## 2022-02-18 LAB — COMPREHENSIVE METABOLIC PANEL
ALBUMIN: 3.9 g/dL (ref 3.5–5.0)
ALK PHOSPHATASE: 61 U/L (ref 25–110)
ALT: 68 U/L — ABNORMAL HIGH (ref 7–56)
ANION GAP: 10 (ref 3–12)
AST: 40 U/L (ref 7–40)
BLD UREA NITROGEN: 21 mg/dL (ref 7–25)
CALCIUM: 8.7 mg/dL (ref 8.5–10.6)
CHLORIDE: 95 MMOL/L — ABNORMAL LOW (ref 98–110)
CO2: 26 MMOL/L (ref 21–30)
CREATININE: 1.4 mg/dL — ABNORMAL HIGH (ref 0.4–1.24)
EGFR: >60 mL/min (ref >60)
GLUCOSE,PANEL: 477 mg/dL — ABNORMAL HIGH (ref 70–100)
POTASSIUM: 4.3 MMOL/L (ref 3.5–5.1)
SODIUM: 131 MMOL/L — ABNORMAL LOW (ref 137–147)
TOTAL BILIRUBIN: 0.5 mg/dL (ref 0.3–1.2)
TOTAL PROTEIN: 6.6 g/dL (ref 6.0–8.0)

## 2022-02-18 NOTE — Patient Instructions
[facility]  [address]  [place], [place] [address]      Dr. Junita Push has recommended vein procedure(s) including, ultrasound-guided foam sclerotherapy of right leg.    We will give your information to our insurance department and they will submit to your insurance for approval of these procedures.  Approval could take up to 30 business days.  Once a response is received from your insurance, you will receive a call or letter with your benefit information.  At that time if you wish to proceed, we can get you scheduled.    You need to obtain a prescription strength compression stocking (20-30 mmHg) prior to your procedure.  These should be thigh high or panty-hose length.  Compression stockings are not supplied by [facility] and must be purchased at a durable medical equipment location.      Shower and shave the operative leg from groin to ankle the day before your procedure.  Wear loose, comfortable clothing (such as sweatpants) to your procedure.    You may be instructed to apply EMLA cream to operative leg approximately two hours prior to procedure.    If an anti-anxiety medication is prescribed, you will need a driver to take you home from your procedure.    Do not come to the office on an empty stomach.  We encourage you to have a small meal 1-2 hours prior to arriving at the office.  You may take all your medications as normally scheduled.    Expect to be in our office for 1-2 hours on your scheduled procedure date.  Please allow flexibility in your schedule as procedure start times are subject to change.    You may have a scheduled ultrasound appointment within 24-72 hours after your procedure.    Multiple dates of service are often required to accommodate all recommended procedures    Most patients experience some discomfort following the procedure.  Some people find it helpful to take one to two days off from work; although we expect you to be able to resume most pre-procedure activities the following day (extended periods of time off work/school will not be excused).      We would like you to continue to keep moving and maintain a normal level of activity post procedure.  Avoid heavy aerobic activity, weightlifting or any water activities for one week.  Avoid air travel for one week.     A thigh high or pantyhose compression stocking will be worn post operatively for a period of up to 4 weeks.    Please note that although this procedure is being performed in the office it is still considered a minor procedure and will require some recovery.  Please make arrangements if you need someone to help in your care.    Please ensure that your MyChart access is active as this is how our office will communicate with you.  This is also the preferred method of communication to the nursing staff.         Understanding Varicose Veins and Treatment Options     Your veins function to carry unoxygenated blood from your extremities back toward your heart.  To prevent back flow of venous blood, your veins are lined with valves.  Valves open to allow blood to flow up and they close to prevent blood from flowing back down the leg.  If your valves do not close properly, it can allow back flow of  blood and excessive pressure on the small superficial veins under the surface of your skin.  This pressure can lead to an abnormal dilation of the superficial vein which in turn causes the vein to bulge and be visible under the skin.      Varicose veins can cause pain, cramping, itching, and complications including superficial thrombophlebitis (blood clots inside the varicose vein).    Treatment for varicose veins is determined by your provider but may include microphlebectomy and/or foam sclerotherapy.       Foam sclerotherapy is a minimally invasive procedure done in the office.  A small needle is inserted into the varicose vein under ultrasound guidance and a foam medication is injected into the diseased vein.  This medication causes irritation to the inner lining of the vein eventually causing inflammation, swelling, and closure of the vein.  The process for the veins to close can take up to 6 weeks and may require multiple treatment sessions for optimal results.        If your doctor recommends any intervention, detailed instructions regarding your procedure will follow scheduling.

## 2022-02-19 ENCOUNTER — Encounter
Admit: 2022-02-19 | Discharge: 2022-02-19 | Payer: No Typology Code available for payment source | Primary: Student in an Organized Health Care Education/Training Program

## 2022-02-19 DIAGNOSIS — I5032 Chronic diastolic (congestive) heart failure: Secondary | ICD-10-CM

## 2022-02-19 DIAGNOSIS — E1159 Type 2 diabetes mellitus with other circulatory complications: Secondary | ICD-10-CM

## 2022-02-19 MED ORDER — ENTRESTO 24-26 MG PO TAB
1 | ORAL_TABLET | Freq: Two times a day (BID) | ORAL | 1 refills | Status: AC
Start: 2022-02-19 — End: ?

## 2022-02-19 NOTE — Telephone Encounter
Called and spoke with pt on authorized line using two patient identifiers. Informed pt of interpretations of labs by Helene Kelp, APRN and medications recommendations. Pt VU of the medication instructions. [facility] [address] [facility] Entresto rx. Pt VU to get labs drawn 1 week after starting Entresto. CB# provided.     Pt informed his glucose is elevated and encouraged to follow with Dr. Standley Dakins for this. Pt states he is taking metformin and Ozempic but has not restarted Jardiance as he was previously instructed to stop and has not restarted. Will route to Helene Kelp, APRN for recommendations regarding Jardiance.         Due to language barrier, an interpreter was present during the history-taking and subsequent discussion (and for part of the physical exam) with this patient.    Interpreter mode: Telephonic  Interpreter/ ID Number: [id number]

## 2022-02-19 NOTE — Telephone Encounter
-----   Message from Norva Karvonen, APRN-NP sent at 02/19/2022  1:45 PM CDT -----  His creatinine is stable at 1.40. His blood pressure is elevated. Please transition his lisinopril 20 mg bid to Entresto 24-26 mg BID. He will need to HOLD the lisinopril for 36 hours before starting the Entresto. He will need BMP one week after starting Entresto.    The sodium is 131 but his blood sugar is 477- corrected sodium 137. His blood sugar is high- please confirm he is taking the metformin, Jardiance, and Ozempic and he keeps his follow-up appointment with Dr. Standley Dakins in December. Dr. Standley Dakins- adding you to this so you are aware and in case you wanted to make adjustments prior to [place] appointment with you 04/04/22.

## 2022-02-20 ENCOUNTER — Encounter
Admit: 2022-02-20 | Discharge: 2022-02-20 | Payer: No Typology Code available for payment source | Primary: Student in an Organized Health Care Education/Training Program

## 2022-02-20 NOTE — Telephone Encounter
Prior authorization request received through CoverMyMeds for Entresto. PA submitted. Key: [id number] - PA Case ID: [id number] - Rx #: [id number] Determination pending.

## 2022-02-20 NOTE — Telephone Encounter
Call placed to [facility] and informed of approval. Claim was processed and paid with a $0 co-pay for a 90-day supply. Call placed to pt with the assistance of [place] # [phone]. Pt verbalized name and date of birth to verify patient identity. Informed pt of approval and co-pay. Reminded pt to start Entresto one tab twice daily 36 hours after his last dose of Lisinopril. Pt verbalized understanding and agreed with plan.

## 2022-02-21 ENCOUNTER — Encounter
Admit: 2022-02-21 | Discharge: 2022-02-21 | Payer: No Typology Code available for payment source | Primary: Student in an Organized Health Care Education/Training Program

## 2022-02-21 DIAGNOSIS — I872 Venous insufficiency (chronic) (peripheral): Secondary | ICD-10-CM

## 2022-02-21 NOTE — Telephone Encounter
-----   Message from Corky Crafts, MD sent at 02/20/2022 12:18 PM CDT -----  PAPS right LE

## 2022-02-21 NOTE — Telephone Encounter
Results reviewed with patient in office. Patient has worn compression for greater than 3 months without relief of symptoms. Patient is ready for precert. Vein procedure order placed.

## 2022-02-24 ENCOUNTER — Encounter
Admit: 2022-02-24 | Discharge: 2022-02-24 | Payer: No Typology Code available for payment source | Primary: Student in an Organized Health Care Education/Training Program

## 2022-02-25 ENCOUNTER — Encounter
Admit: 2022-02-25 | Discharge: 2022-02-25 | Payer: No Typology Code available for payment source | Primary: Student in an Organized Health Care Education/Training Program

## 2022-02-25 ENCOUNTER — Ambulatory Visit
Admit: 2022-02-25 | Discharge: 2022-02-25 | Payer: No Typology Code available for payment source | Primary: Student in an Organized Health Care Education/Training Program

## 2022-02-25 MED ORDER — OZEMPIC 1 MG/DOSE (4 MG/3 ML) SC PNIJ
1 mg | SUBCUTANEOUS | 1 refills | Status: CN
Start: 2022-02-25 — End: ?

## 2022-02-25 NOTE — Progress Notes
The prior authorization for Ozempic has been submitted for Danny Kidd via Cover My Meds [phone].  Will continue to follow.    [place] Patient Advocate  [phone]

## 2022-02-25 NOTE — Progress Notes
A follow-up comprehensive medication management visit was completed today via telephone.    Due to language barrier, an interpreter was present during the history-taking and subsequent discussion with this patient.    Interpreter mode: telephonic  Interpreter/ID number: [id number]    Referral reason: Diabetes Management  Referring provider: Dr. Threasa Beards, Dr. Marlana Salvage; PCP: Dr. Mitchell Heir    Assessment & Plan     Diabetes  Patient's diabetes is uncontrolled as reflected by home blood sugars. Their goal glucose is pre-prandial 80-130 and post-prandial <180. Their most recent A1C was 7.6% on 01/09/2022 with a goal of < 7% based on ADA guidelines.    Patient-reported fasting BG: 190-210 mg/dL  Recent BG on recent CMP (02/18/22): 477 mg/dL  Escalation in therapy is necessary; appropriate to titrate up on Ozempic dose.     Plan  Increase Ozempic to 1 mg once weekly   Continue metformin 1000 mg BID      Follow-up  The patient will continue to follow up with the pharmacist. Return to pharmacist in 3 weeks via telephone. The return visit was scheduled during today's visit.    Subjective & Objective   At the last visit, the patient was instructed to continue current regimen. The patient states they made all of the recommended changes.    Diabetes    HPI  Current diabetes medication regimen:   Metformin 1000 mg BID   Ozempic 0.5 mg once weekly      Previous medications for diabetes:   Jardiance (stopped by Cardiology due to concerns of AKI)    Blood Glucose  The baseline HbA1C was 7.6 on 01/09/2022.    Patient reported readings:  Fasting: 190-210    Comorbidities  ASCVD condition(s): none  Heart failure: yes  Obesity: yes    Health Maintenance  Aspirin utilization: Patient is not taking aspirin due to the following reason(s): not indicated.  Statin utilization: Patient is taking a statin.  Hypertension: Patient has hypertension, which is controlled.  Smoking status: former    Laboratory Values       Lab Draw:  Lab Results   Component Value Date/Time    HGBA1C 7.6 (H) 01/09/2022 04:35 PM    HGBA1C 7.1 (H) 01/01/2022 08:14 PM     POC:  No results found for: A1C  BP Readings from Last 3 Encounters:   02/18/22 (!) 148/75   02/18/22 (!) 161/104   01/09/22 121/64     Lab Results   Component Value Date    CHOL 238 (H) 01/09/2022    TRIG 851 (H) 01/09/2022    HDL 40 (L) 01/09/2022    LDL 131 (H) 01/09/2022    VLDL 170 01/09/2022    NONHDLCHOL 198 01/09/2022       Basic Metabolic Profile    Lab Results   Component Value Date/Time    NA 131 (L) 02/18/2022 04:20 PM    K 4.3 02/18/2022 04:20 PM    CA 8.7 02/18/2022 04:20 PM    CL 95 (L) 02/18/2022 04:20 PM    CO2 26 02/18/2022 04:20 PM    GAP 10 02/18/2022 04:20 PM    EGFR1 >60 02/18/2022 04:20 PM    Lab Results   Component Value Date/Time    BUN 21 02/18/2022 04:20 PM    CR 1.40 (H) 02/18/2022 04:20 PM    GLU 477 (H) 02/18/2022 04:20 PM          eGFR   Date Value Ref Range Status   02/18/2022 >60 >60  mL/min Final     Comment:     eGFR calculated using the CKD-EPIcr_R equation     Lab Results   Component Value Date/Time    URMALBCRRAT 85.41 (H) 01/09/2022 04:36 PM     Wt Readings from Last 3 Encounters:   02/18/22 133.4 kg (294 lb)   02/18/22 127.5 kg (281 lb)   01/09/22 127.8 kg (281 lb 12.8 oz)      BMI Readings from Last 1 Encounters:   02/18/22 48.92 kg/m?       Medication History  Medication history was not completed because previously completed (follow-up visit).    Home Medications    Medication Sig   acetaminophen (TYLENOL) 325 mg tablet Take one tablet by mouth daily as needed for Pain.   cetirizine (ZYRTEC) 10 mg tablet Take one tablet by mouth daily as needed for Allergy symptoms.   gabapentin (NEURONTIN) 300 mg capsule Take one capsule by mouth three times daily.   MAGNESIUM CITRATE PO Take 1 tablet by mouth every evening.   metFORMIN (GLUCOPHAGE) 1,000 mg tablet Take one tablet by mouth twice daily after meals.   rosuvastatin (CRESTOR) 20 mg tablet Take one tablet by mouth daily. sacubitriL-valsartan (ENTRESTO) 24-26 mg tablet Take one tablet by mouth twice daily. Start 3 days after you stop taking Lisinopril   semaglutide (OZEMPIC) 0.25 mg or 0.5 mg (2 mg/3 mL) injection PEN Inject one-half mg under the skin every 7 days.   sodium chloride (SALINE NASAL MIST NA) Apply 1 spray into nose as directed daily.   spironolactone (ALDACTONE) 25 mg tablet Take one tablet by mouth daily. Take with food.  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

## 2022-02-26 ENCOUNTER — Encounter
Admit: 2022-02-26 | Discharge: 2022-02-26 | Payer: No Typology Code available for payment source | Primary: Student in an Organized Health Care Education/Training Program

## 2022-02-26 NOTE — Progress Notes
The prior authorization for Ozempic '1mg'$  has been submitted for Danny Kidd via Cover My Meds [phone].  Will continue to follow.    [place] Patient Advocate  [phone]

## 2022-02-27 ENCOUNTER — Encounter
Admit: 2022-02-27 | Discharge: 2022-02-27 | Payer: No Typology Code available for payment source | Primary: Student in an Organized Health Care Education/Training Program

## 2022-02-27 NOTE — Progress Notes
The Prior Authorization for Ozempic '1mg'$  was re-approved for [employer] until 02/26/2023.  The copay is $0 / month.  The PA authorization number is [id number].    The specialty pharmacy will pursue additional copay assistance as necessary.  The specialty pharmacy will reach out to the provider if the copay becomes unaffordable.    [place] Patient Advocate  [phone]

## 2022-03-03 ENCOUNTER — Encounter
Admit: 2022-03-03 | Discharge: 2022-03-03 | Payer: No Typology Code available for payment source | Primary: Student in an Organized Health Care Education/Training Program

## 2022-03-07 ENCOUNTER — Encounter
Admit: 2022-03-07 | Discharge: 2022-03-07 | Payer: No Typology Code available for payment source | Primary: Student in an Organized Health Care Education/Training Program

## 2022-03-07 NOTE — Telephone Encounter
I called and LM for patient with the help of an interpreter to inform him we are mailing out and insurance courtesy letter.    Procedure Recommended:     #1 Right leg percutaneous chemical ablation of perforator vein 364[address].    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

## 2022-03-08 ENCOUNTER — Encounter
Admit: 2022-03-08 | Discharge: 2022-03-08 | Payer: No Typology Code available for payment source | Primary: Student in an Organized Health Care Education/Training Program

## 2022-03-08 MED FILL — OZEMPIC 1 MG/DOSE (4 MG/3 ML) SC PNIJ: 1 mg/dose (4 mg/3 mL) | SUBCUTANEOUS | 28 days supply | Qty: 3 | Fill #1 | Status: CP

## 2022-03-17 ENCOUNTER — Ambulatory Visit
Admit: 2022-03-17 | Discharge: 2022-03-17 | Payer: No Typology Code available for payment source | Primary: Student in an Organized Health Care Education/Training Program

## 2022-03-17 ENCOUNTER — Encounter
Admit: 2022-03-17 | Discharge: 2022-03-17 | Payer: No Typology Code available for payment source | Primary: Student in an Organized Health Care Education/Training Program

## 2022-03-17 NOTE — Progress Notes
A follow-up comprehensive medication management visit was completed today via telephone.    Due to language barrier, an interpreter was present during the history-taking and subsequent discussion with this patient.    Interpreter mode: telephonic  Interpreter/ID number: [id number]    Referral reason: Diabetes Management  Referring provider: Dr. Threasa Beards, Dr. Marlana Salvage; PCP: Dr. Mitchell Heir    Assessment & Plan     Diabetes  Patient's diabetes is uncontrolled as reflected by home blood sugars. Their goal glucose is pre-prandial 80-130 and post-prandial <180. Their most recent A1C was 7.6% on 01/09/2022 with a goal of < 7% based on ADA guidelines.    Home BG readings continue to improve (fasting BG 167 mg/dL) with continued use of Ozempic; he has only taken 1 dose of the 1 mg/wk thus far. No changes warranted at this time. Note that Ozempic 2 mg/wk dose is currently on backorder, so we may need to keep patient on Ozempic 1 mg/wk for longer period of time, or look into switching him to [facility] if insurance allows.     Plan  Continue current regimen - metformin 1000 mg BID and Ozempic 1 mg once weekly  PCP appt on 04/04/22      Follow-up  The patient will continue to follow up with the pharmacist. Return to pharmacist in 1 month via telephone. The return visit was scheduled during today's visit.    Subjective & Objective   At the last visit, the patient was instructed to increase Ozempic to 1 mg once weekly . The patient states they made all of the recommended changes.    Diabetes    HPI  Current diabetes medication regimen:   Metformin 1000 mg BID   Ozempic 1 mg once weekly (Thursday) - taken 1 dose so far    Previous medications for diabetes:   Jardiance (stopped by Cardiology due to concerns of AKI)    Blood Glucose  Monitoring times: fasting  The baseline HbA1C was 7.6 on 01/09/2022.    Patient reported readings:  Yesterday: 167 mg/dL    Comorbidities  ASCVD condition(s): none  Heart failure: yes  Obesity: yes    Health Maintenance  Aspirin utilization: Patient is not taking aspirin due to the following reason(s): not indicated.  Statin utilization: Patient is taking a statin.  Hypertension: Patient has hypertension, which is controlled.  Smoking status: former    Laboratory Values       Lab Draw:  Lab Results   Component Value Date/Time    HGBA1C 7.6 (H) 01/09/2022 04:35 PM    HGBA1C 7.1 (H) 01/01/2022 08:14 PM     POC:  No results found for: A1C  BP Readings from Last 3 Encounters:   02/18/22 (!) 148/75   02/18/22 (!) 161/104   01/09/22 121/64     Lab Results   Component Value Date    CHOL 238 (H) 01/09/2022    TRIG 851 (H) 01/09/2022    HDL 40 (L) 01/09/2022    LDL 131 (H) 01/09/2022    VLDL 170 01/09/2022    NONHDLCHOL 198 01/09/2022       Basic Metabolic Profile    Lab Results   Component Value Date/Time    NA 131 (L) 02/18/2022 04:20 PM    K 4.3 02/18/2022 04:20 PM    CA 8.7 02/18/2022 04:20 PM    CL 95 (L) 02/18/2022 04:20 PM    CO2 26 02/18/2022 04:20 PM    GAP 10 02/18/2022 04:20 PM    EGFR1 >  60 02/18/2022 04:20 PM    Lab Results   Component Value Date/Time    BUN 21 02/18/2022 04:20 PM    CR 1.40 (H) 02/18/2022 04:20 PM    GLU 477 (H) 02/18/2022 04:20 PM          eGFR   Date Value Ref Range Status   02/18/2022 >60 >60 mL/min Final     Comment:     eGFR calculated using the CKD-EPIcr_R equation     Lab Results   Component Value Date/Time    URMALBCRRAT 85.41 (H) 01/09/2022 04:36 PM     Wt Readings from Last 3 Encounters:   02/18/22 133.4 kg (294 lb)   02/18/22 127.5 kg (281 lb)   01/09/22 127.8 kg (281 lb 12.8 oz)      BMI Readings from Last 1 Encounters:   02/18/22 48.92 kg/m?       Medication History  Medication history was not completed because previously completed (follow-up visit).    Home Medications    Medication Sig   acetaminophen (TYLENOL) 325 mg tablet Take one tablet by mouth daily as needed for Pain.   cetirizine (ZYRTEC) 10 mg tablet Take one tablet by mouth daily as needed for Allergy symptoms. gabapentin (NEURONTIN) 300 mg capsule Take one capsule by mouth three times daily.   MAGNESIUM CITRATE PO Take 1 tablet by mouth every evening.   metFORMIN (GLUCOPHAGE) 1,000 mg tablet Take one tablet by mouth twice daily after meals.   rosuvastatin (CRESTOR) 20 mg tablet Take one tablet by mouth daily.   sacubitriL-valsartan (ENTRESTO) 24-26 mg tablet Take one tablet by mouth twice daily. Start 3 days after you stop taking Lisinopril   semaglutide (OZEMPIC) 1 mg/dose (4 mg/3 mL) injection PEN Inject one mg under the skin every 7 days.   sodium chloride (SALINE NASAL MIST NA) Apply 1 spray into nose as directed daily.   spironolactone (ALDACTONE) 25 mg tablet Take one tablet by mouth daily. Take with food.  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

## 2022-03-24 ENCOUNTER — Encounter
Admit: 2022-03-24 | Discharge: 2022-03-24 | Payer: No Typology Code available for payment source | Primary: Student in an Organized Health Care Education/Training Program

## 2022-03-26 ENCOUNTER — Encounter
Admit: 2022-03-26 | Discharge: 2022-03-26 | Payer: No Typology Code available for payment source | Primary: Student in an Organized Health Care Education/Training Program

## 2022-03-26 ENCOUNTER — Ambulatory Visit
Admit: 2022-03-26 | Discharge: 2022-03-27 | Payer: No Typology Code available for payment source | Primary: Student in an Organized Health Care Education/Training Program

## 2022-03-26 DIAGNOSIS — E8881 Metabolic syndrome: Secondary | ICD-10-CM

## 2022-03-26 DIAGNOSIS — E1159 Type 2 diabetes mellitus with other circulatory complications: Secondary | ICD-10-CM

## 2022-03-26 DIAGNOSIS — I10 Essential (primary) hypertension: Secondary | ICD-10-CM

## 2022-03-26 DIAGNOSIS — E119 Type 2 diabetes mellitus without complications: Secondary | ICD-10-CM

## 2022-03-26 DIAGNOSIS — D179 Benign lipomatous neoplasm, unspecified: Secondary | ICD-10-CM

## 2022-03-26 DIAGNOSIS — I8390 Asymptomatic varicose veins of unspecified lower extremity: Secondary | ICD-10-CM

## 2022-03-26 MED ORDER — MOUNJARO 7.5 MG/0.5 ML SC PNIJ
7.5 mg | SUBCUTANEOUS | 0 refills | Status: AC
Start: 2022-03-26 — End: ?
  Filled 2022-05-15: qty 2, 28d supply, fill #1

## 2022-03-26 MED ORDER — MOUNJARO 10 MG/0.5 ML SC PNIJ
10 mg | SUBCUTANEOUS | 0 refills | Status: AC
Start: 2022-03-26 — End: ?

## 2022-03-26 NOTE — Progress Notes
[facility]  Initial Visit    Date of Service: 03/26/2022  PCP: Danny Kidd  DOB: 1980/08/11  MRN#: [id number]    Danny Kidd is Kidd 41 y.o. male.        History of Present Illness  Danny Kidd is Kidd 41 y.o. male with  has Kidd past medical history of DM (diabetes mellitus) (HCC), Hypertension, Lipoma, and Varicose veins of lower extremity. presenting for an initial discussion regarding obesity and weight management.    Weight history/ previous weight loss attempts:The patient states he has had issues with his weight since he was 41 y/o. Since then he has had Kidd steady increase in his weight despite many attempts (various diets and exercise) at weight loss. He is concerned about his health, particularly T2DM, vascular disease, and high BP.    He has been treated with Ozempic 1mg  for T2DM but has not experienced significant weight loss. He is interested in bariatric surgery.    Current nutrition:He does not follow Kidd specific diet plan. 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 He does crave sweets and often drinks fruit juices.    24 dietary recall:   Breakfast - skipped  Snack -   Lunch - lasagna, salad, and bread  Snack -   Dinner - banana  Snack -     Activity:none    Overall Obesity Management Goals: improve his health    Past Medical History -  has Kidd past medical history of DM (diabetes mellitus) (HCC), Hypertension, Lipoma, and Varicose veins of lower extremity.     Past Surgical History -  has Kidd past surgical history that includes vein stripping; tumor excision (Left, 08/19/2021); endovenous ablation of the great saphenous vein (Right, 06/20/2021); and microphlebectomy (Right, 06/20/2021).     Objective:     Physical Exam  Constitutional: Well developed, well nourished, no distress   HENT: Normocephalic, atraumatic  Cardiac: RRR, no murmur  Respiratory: Normal effort, clear to auscultation, no respiratory distress  Neurologic: Alert  Psych: mood /affect normal. Behavior normal. Thought content normal. Judgement normal.    Medications-   acetaminophen (TYLENOL) 325 mg tablet Take one tablet by mouth daily as needed for Pain.    cetirizine (ZYRTEC) 10 mg tablet Take one tablet by mouth daily as needed for Allergy symptoms.    gabapentin (NEURONTIN) 300 mg capsule Take one capsule by mouth three times daily.    MAGNESIUM CITRATE PO Take 1 tablet by mouth every evening.    metFORMIN (GLUCOPHAGE) 1,000 mg tablet Take one tablet by mouth twice daily after meals.    rosuvastatin (CRESTOR) 20 mg tablet Take one tablet by mouth daily.    sacubitriL-valsartan (ENTRESTO) 24-26 mg tablet Take one tablet by mouth twice daily. Start 3 days after you stop taking Lisinopril    semaglutide (OZEMPIC) 1 mg/dose (4 mg/3 mL) injection PEN Inject one mg under the skin every 7 days.    sodium chloride (SALINE NASAL MIST NA) Apply 1 spray into nose as directed daily.    spironolactone (ALDACTONE) 25 mg tablet Take one tablet by mouth daily. Take with food.  Indications: fluid in the lungs due to chronic heart failure    torsemide (DEMADEX) 20 mg tablet Take one tablet by mouth daily. Indications: fluid in the lungs due to chronic heart failure     Recent Labs:  Basic Metabolic Profile    Lab Results   Component Value Date/Time    NA 131 (L) 02/18/2022 04:20 PM    K 4.3 02/18/2022 04:20 PM    CA 8.7 02/18/2022 04:20 PM    CL 95 (L) 02/18/2022 04:20 PM    CO2 26 02/18/2022 04:20 PM    GAP 10 02/18/2022 04:20 PM    Lab Results   Component Value Date/Time    BUN 21 02/18/2022 04:20 PM    CR 1.40 (H) 02/18/2022 04:20 PM    GLU 477 (H) 02/18/2022 04:20 PM        Hepatic Function    Lab Results   Component Value Date/Time    ALBUMIN 3.9 02/18/2022 04:20 PM    TOTPROT 6.6 02/18/2022 04:20 PM    ALKPHOS 61 02/18/2022 04:20 PM    Lab Results   Component Value Date/Time    AST 40 02/18/2022 04:20 PM    ALT 68 (H) 02/18/2022 04:20 PM    TOTBILI 0.5 02/18/2022 04:20 PM        TSH   No results found for: TSH   Lab Draw:  Lab Results   Component Value Date/Time    HGBA1C 7.6 (H) 01/09/2022 04:35 PM    HGBA1C 7.1 (H) 01/01/2022 08:14 PM     POC:  No results found for: A1C   Lab Results   Component Value Date    CHOL 238 (H) 01/09/2022    TRIG 851 (H) 01/09/2022    HDL 40 (L) 01/09/2022    LDL 131 (H) 01/09/2022    VLDL 170 01/09/2022    NONHDLCHOL 198 01/09/2022      Wt Readings from Last 5 Encounters:   03/26/22 124.7 kg (275 lb)   02/18/22 133.4 kg (294 lb)   02/18/22 127.5 kg (281 lb)   01/09/22 127.8 kg (281 lb 12.8 oz)   01/04/22 130 kg (286 lb 9.6 oz)     BP Readings from Last 3 Encounters:   03/26/22 (!) 142/77   02/18/22 (!) 148/75   02/18/22 (!) 161/104     Vitals:    03/26/22 1401   BP: (!) 142/77   BP Source: Arm, Right Upper   Pulse: 97   Resp: 18   PainSc: Zero   Weight: 124.7 kg (275 lb)   Height: 165.1 cm (5' 5)      Body mass index is 45.76 kg/m?Marland Kitchen       Assessment and Plan:  Obesity-   Initial weight & BMI:    Wt Readings from Last 1 Encounters:   03/26/22 124.7 kg (275 lb)   Body mass index is 45.76 kg/m?.  03/26/22    Treatment:  Plan/ level of monitoring: Nutrition Counseling, Pharmacotherapy, and Refer to Bariatric Surgery  Nutrition Goals - Decrease portion sizes and avoid sweet beverages and fried foods  Activity Goals - try to start walking 15 min daily    Medication Recommendations- Switch to [facility] 7.5mg  and titrate as tolerated  Discussion of safety/side effects     Obesity Management goals:    Further obesity management recommendations:   Annual screen for Kidd assessment of Type II diabetes, HTN, Hyperlipidemia, Depression, OSA, NAFLD, Vitamin D deficiency and renal disease.   Given the association of obesity with cancer, all age appropriate cancer screenings should be UTD  Medication Review: If possible medication that promote weight gain should be avoided and medication that are weight-neutral or promote weight loss should be considered.  Comorbid Conditions:  T2DM - poor control. Start Mounjaro., Continue metformin  Metabolic Syndrome with HPL and HTN  HFpEF  OSA    Total Time Today was 60 minutes in the following activities: Preparing to see the patient, Obtaining and/or reviewing separately obtained history, Performing Kidd medically appropriate examination and/or evaluation, Counseling and educating the patient/family/caregiver, Ordering medications, tests, or procedures, Referring and communication with other health care professionals (when not separately reported), and Documenting clinical information in the electronic or other health record      Future Appointments   Date Time Provider Department Center   04/03/2022  8:00 AM Arnspiger, Fanny Bien, MD [id number] Surgery   04/04/2022  7:50 AM [facility] ROOM 4 [id number] Surgery   04/04/2022  3:00 PM Oletta Lamas, MD [facility]   04/23/2022  1:30 PM Alain Marion, PHARMD [facility] FM

## 2022-03-26 NOTE — Progress Notes
Due to language barrier, an interpreter was present during the history-taking and subsequent discussion (and for part of the physical exam) with this patient.    Interpreter mode: Telephonic  Interpreter/ ID Number: [id number] Lenard Galloway

## 2022-03-27 ENCOUNTER — Encounter
Admit: 2022-03-27 | Discharge: 2022-03-27 | Payer: No Typology Code available for payment source | Primary: Student in an Organized Health Care Education/Training Program

## 2022-03-27 DIAGNOSIS — E1151 Type 2 diabetes mellitus with diabetic peripheral angiopathy without gangrene: Secondary | ICD-10-CM

## 2022-03-27 DIAGNOSIS — I10 Essential (primary) hypertension: Secondary | ICD-10-CM

## 2022-03-27 DIAGNOSIS — G4733 Obstructive sleep apnea (adult) (pediatric): Secondary | ICD-10-CM

## 2022-03-27 DIAGNOSIS — Z6841 Body Mass Index (BMI) 40.0 and over, adult: Secondary | ICD-10-CM

## 2022-03-27 DIAGNOSIS — E7800 Pure hypercholesterolemia, unspecified: Secondary | ICD-10-CM

## 2022-03-27 DIAGNOSIS — I5032 Chronic diastolic (congestive) heart failure: Secondary | ICD-10-CM

## 2022-03-27 MED FILL — ROSUVASTATIN 20 MG PO TAB: 20 mg | ORAL | 90 days supply | Qty: 90 | Fill #1 | Status: CP

## 2022-03-27 NOTE — Progress Notes
Pharmacy Benefits Investigation        The insurance requires a prior authorization for the medication. The prior authorization was submitted via CoverMyMeds. Will follow up in 2 business days.    PA number: [place]  Specialty Pharmacy Patient Advocate

## 2022-03-27 NOTE — Progress Notes
Pharmacy Benefits Investigation    Medication name: Danny Kidd    The prior authorization was denied by insurance. The insurance provided the following alternative medications: Truclity must be tried for 3 months before [name] will be considered for approval..    If an alternative therapy is not appropriate and the prescribed medication and dose are necessary, the insurance will require the decision be appealed. This will require a letter for medical necessity. The clinic was notified. To proceed with an appeal, completed appeal materials should be returned to the specialty pharmacy team. Will follow up in 7 business days if no response. Please contact the specialty pharmacy with any questions regarding next steps.          Dietrich Pates  Specialty Pharmacy Patient Advocate

## 2022-03-28 ENCOUNTER — Encounter
Admit: 2022-03-28 | Discharge: 2022-03-28 | Payer: No Typology Code available for payment source | Primary: Student in an Organized Health Care Education/Training Program

## 2022-03-28 MED FILL — OZEMPIC 1 MG/DOSE (4 MG/3 ML) SC PNIJ: 1 mg/dose (4 mg/3 mL) | SUBCUTANEOUS | 28 days supply | Qty: 3 | Fill #2 | Status: AC

## 2022-03-31 ENCOUNTER — Encounter
Admit: 2022-03-31 | Discharge: 2022-03-31 | Payer: No Typology Code available for payment source | Primary: Student in an Organized Health Care Education/Training Program

## 2022-04-03 ENCOUNTER — Encounter
Admit: 2022-04-03 | Discharge: 2022-04-03 | Payer: No Typology Code available for payment source | Primary: Student in an Organized Health Care Education/Training Program

## 2022-04-03 ENCOUNTER — Ambulatory Visit
Admit: 2022-04-03 | Discharge: 2022-04-04 | Payer: No Typology Code available for payment source | Primary: Student in an Organized Health Care Education/Training Program

## 2022-04-03 DIAGNOSIS — D179 Benign lipomatous neoplasm, unspecified: Secondary | ICD-10-CM

## 2022-04-03 DIAGNOSIS — E119 Type 2 diabetes mellitus without complications: Secondary | ICD-10-CM

## 2022-04-03 DIAGNOSIS — I872 Venous insufficiency (chronic) (peripheral): Secondary | ICD-10-CM

## 2022-04-03 DIAGNOSIS — I8390 Asymptomatic varicose veins of unspecified lower extremity: Secondary | ICD-10-CM

## 2022-04-03 DIAGNOSIS — I10 Essential (primary) hypertension: Secondary | ICD-10-CM

## 2022-04-03 MED ORDER — POLIDOCANOL 1 % (20 MG/2 ML) IV SOLN
1 mL | Freq: Once | INTRAMUSCULAR | 0 refills | Status: CP
Start: 2022-04-03 — End: ?
  Administered 2022-04-03: 15:00:00 1 mL via INTRAMUSCULAR

## 2022-04-03 NOTE — Patient Instructions
[facility]  [address]  [address], [address]  [place], [place] [address]  [phone]  Fax [phone]      Instructions following Vein Procedure      You may remove the compression wrap/stocking and bandages in 48 hours and start using your compression stocking.      You may shower after removing the bandages in 48 hours    Wear your compression stocking daily after removing ACE bandages    If you are being followed by a wound care center, please continue your normal wound care regimen after removal of you ACE bandages.    Please avoid hot tubs, baths, pools and lakes for one week.    Please avoid long travel (i.e. plane or car) for one week.     Activity:  Walking is OK and encouraged starting the same day as the procedure.  No lower body work out for one week (i.e. running/ weight training)    You are strongly encouraged to take an anti-inflammatory such as Naprosyn (Aleve) twice daily, or Ibuprofen (Motrin, Advil) 400-600 mg three time daily, for at least the first 3 days following your procedure.  Some discomfort post procedure is normal, however if you have prolonged pain that is interfering with your daily activities, please contact our office.     Your follow up appointment has been scheduled with your doctor.  Please call to reschedule if your given time does not work        Please call if you have any questions.    Bradly Bienenstock, RN, BSN was your nurse today    Corky Crafts, MD was your doctor today.

## 2022-04-03 NOTE — Procedures
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    Procedure start time: 8:33am    Procedure: The patient was placed in the supine position on the operating table, the leg prepped with Chloroprep and sterile drapes applied.    A time out was performed with provider, medical assistant, nurse, and patient verifying correct patient, correct procedure and correct site at 8:33am.    The perforator vein(s) were mapped and marked with duplex ultrasound.  The patient was placed in steep trendelenburg with the leg elevated.  Foamed sclerotherapy was prepared using the Tesari method with 1.0 cc of 1% polidocanol mixed with 2.0 cc of medical grade CO2.  A 27-gauge butterfly needle was directed into the perforator vein(s) anterior to the fascia and injected under ultrasound visualization.  A total volume; 3 cc of foamed 1% was injected.  The leg was then elevated 30 degrees for 15 minutes and pressure held for 5 minutes.  A sterile compression dressing was applied.  No immediate procedural complications noted.  The patient tolerated the procedure well.  Post procedure instructions were given.    Procedure end time: 8:45am    Follow Up: Post op ultrasound scheduled for 04/04/22      Corky Crafts, MD                         Date: 04/03/2022

## 2022-04-04 ENCOUNTER — Ambulatory Visit
Admit: 2022-04-04 | Discharge: 2022-04-04 | Payer: No Typology Code available for payment source | Primary: Student in an Organized Health Care Education/Training Program

## 2022-04-04 ENCOUNTER — Encounter
Admit: 2022-04-04 | Discharge: 2022-04-04 | Payer: No Typology Code available for payment source | Primary: Student in an Organized Health Care Education/Training Program

## 2022-04-04 DIAGNOSIS — I872 Venous insufficiency (chronic) (peripheral): Secondary | ICD-10-CM

## 2022-04-07 ENCOUNTER — Encounter
Admit: 2022-04-07 | Discharge: 2022-04-07 | Payer: No Typology Code available for payment source | Primary: Student in an Organized Health Care Education/Training Program

## 2022-04-09 ENCOUNTER — Encounter
Admit: 2022-04-09 | Discharge: 2022-04-09 | Payer: No Typology Code available for payment source | Primary: Student in an Organized Health Care Education/Training Program

## 2022-04-10 ENCOUNTER — Encounter
Admit: 2022-04-10 | Discharge: 2022-04-10 | Payer: No Typology Code available for payment source | Primary: Student in an Organized Health Care Education/Training Program

## 2022-04-10 NOTE — Telephone Encounter
PA submitted through covermymeds.com for Mounjaro 7.5 mg    PA Key: [facility]    Submitted Diagnosis: T2DM    Determination:DENIED    Patient and pharmacy notified.    Ernie Hew, MA    Appeal letter faxed to insurance to request reconsideration on [facility]. Will wait for response from insurance before reaching out to patient about signing up for patient assistance program. Ernie Hew, [place]

## 2022-04-23 ENCOUNTER — Encounter
Admit: 2022-04-23 | Discharge: 2022-04-23 | Payer: No Typology Code available for payment source | Primary: Student in an Organized Health Care Education/Training Program

## 2022-04-24 ENCOUNTER — Encounter
Admit: 2022-04-24 | Discharge: 2022-04-24 | Payer: No Typology Code available for payment source | Primary: Student in an Organized Health Care Education/Training Program

## 2022-04-24 NOTE — Telephone Encounter
Attempted to contact Celedonio Savage to schedule an appointment with the primary care pharmacist per their primary care provider's request. Left voicemail asking patient to contact us at [phone] to set up an appointment.    Janeece Riggers  Primary Care Pharmacy Patient Advocate  [phone]

## 2022-04-28 ENCOUNTER — Encounter
Admit: 2022-04-28 | Discharge: 2022-04-28 | Payer: No Typology Code available for payment source | Primary: Student in an Organized Health Care Education/Training Program

## 2022-05-06 ENCOUNTER — Encounter
Admit: 2022-05-06 | Discharge: 2022-05-06 | Payer: No Typology Code available for payment source | Primary: Student in an Organized Health Care Education/Training Program

## 2022-05-08 ENCOUNTER — Encounter
Admit: 2022-05-08 | Discharge: 2022-05-08 | Payer: No Typology Code available for payment source | Primary: Student in an Organized Health Care Education/Training Program

## 2022-05-09 ENCOUNTER — Encounter
Admit: 2022-05-09 | Discharge: 2022-05-09 | Payer: No Typology Code available for payment source | Primary: Student in an Organized Health Care Education/Training Program

## 2022-05-14 ENCOUNTER — Encounter
Admit: 2022-05-14 | Discharge: 2022-05-14 | Payer: No Typology Code available for payment source | Primary: Student in an Organized Health Care Education/Training Program

## 2022-05-15 ENCOUNTER — Encounter
Admit: 2022-05-15 | Discharge: 2022-05-15 | Payer: No Typology Code available for payment source | Primary: Student in an Organized Health Care Education/Training Program

## 2022-05-15 NOTE — Telephone Encounter
Attempted to contact Danny Kidd to reschedule an appointment with the primary care pharmacist per their primary care provider's request. Left voicemail and sent MyChart message  asking patient to contact us at [phone] to set up an appointment.    [place] Patient Advocate  [phone]

## 2022-05-22 ENCOUNTER — Encounter
Admit: 2022-05-22 | Discharge: 2022-05-22 | Payer: No Typology Code available for payment source | Primary: Student in an Organized Health Care Education/Training Program

## 2022-05-22 NOTE — Telephone Encounter
Have been attempting to reach Danny Kidd to schedule an appointment with the primary care pharmacist per their primary care provider's request. Attempted to contact patient 3 times, and have asked the patient to call [phone] to schedule an appointment.     The PCP has been notified that the patient will not be scheduled for a pharmacist visit. Should the patient require pharmacist assistance in the future, another referral should be placed.    [place] Technician Coordinator   [phone]

## 2022-05-23 ENCOUNTER — Encounter
Admit: 2022-05-23 | Discharge: 2022-05-23 | Payer: No Typology Code available for payment source | Primary: Student in an Organized Health Care Education/Training Program

## 2022-05-23 DIAGNOSIS — R590 Localized enlarged lymph nodes: Secondary | ICD-10-CM

## 2022-05-23 NOTE — Progress Notes
Diagnoses and all orders for this visit:    Mediastinal lymphadenopathy  -     CT CHEST W CONTRAST; Future; Expected date: 05/23/2022      Verita Schneiders, MD  (Pronouns They/She)  [facility]

## 2022-05-25 ENCOUNTER — Encounter
Admit: 2022-05-25 | Discharge: 2022-05-25 | Payer: No Typology Code available for payment source | Primary: Student in an Organized Health Care Education/Training Program

## 2022-05-26 ENCOUNTER — Encounter
Admit: 2022-05-26 | Discharge: 2022-05-26 | Payer: No Typology Code available for payment source | Primary: Student in an Organized Health Care Education/Training Program

## 2022-05-26 NOTE — Telephone Encounter
Interpreter ID: [id number]    Phone call to pt. RN provided pt with CT (0-1222) phone number to schedule. No further questions at this time. RN transferred pt with interpreter.

## 2022-05-28 ENCOUNTER — Encounter
Admit: 2022-05-28 | Discharge: 2022-05-28 | Payer: No Typology Code available for payment source | Primary: Student in an Organized Health Care Education/Training Program

## 2022-05-28 ENCOUNTER — Ambulatory Visit
Admit: 2022-05-28 | Discharge: 2022-05-28 | Payer: No Typology Code available for payment source | Primary: Student in an Organized Health Care Education/Training Program

## 2022-05-28 DIAGNOSIS — R590 Localized enlarged lymph nodes: Secondary | ICD-10-CM

## 2022-05-28 LAB — POC CREATININE, RAD: CREATININE, POC: 1.1 mg/dL (ref 0.4–1.24)

## 2022-05-28 MED ORDER — SODIUM CHLORIDE 0.9 % IJ SOLN
50 mL | Freq: Once | INTRAVENOUS | 0 refills | Status: CP
Start: 2022-05-28 — End: ?
  Administered 2022-05-28: 21:00:00 50 mL via INTRAVENOUS

## 2022-05-28 MED ORDER — IOHEXOL 350 MG IODINE/ML IV SOLN
70 mL | Freq: Once | INTRAVENOUS | 0 refills | Status: CP
Start: 2022-05-28 — End: ?
  Administered 2022-05-28: 21:00:00 70 mL via INTRAVENOUS

## 2022-05-29 ENCOUNTER — Encounter
Admit: 2022-05-29 | Discharge: 2022-05-29 | Payer: No Typology Code available for payment source | Primary: Student in an Organized Health Care Education/Training Program

## 2022-05-29 MED FILL — MOUNJARO 2.5 MG/0.5 ML SC PNIJ: 2.5 mg/0.5 mL | SUBCUTANEOUS | 28 days supply | Qty: 2 | Fill #1 | Status: CP

## 2022-06-03 ENCOUNTER — Encounter
Admit: 2022-06-03 | Discharge: 2022-06-03 | Payer: No Typology Code available for payment source | Primary: Student in an Organized Health Care Education/Training Program

## 2022-06-03 DIAGNOSIS — R9389 Abnormal findings on diagnostic imaging of other specified body structures: Secondary | ICD-10-CM

## 2022-06-14 ENCOUNTER — Encounter
Admit: 2022-06-14 | Discharge: 2022-06-14 | Payer: No Typology Code available for payment source | Primary: Student in an Organized Health Care Education/Training Program

## 2022-06-18 ENCOUNTER — Encounter
Admit: 2022-06-18 | Discharge: 2022-06-18 | Payer: No Typology Code available for payment source | Primary: Student in an Organized Health Care Education/Training Program

## 2022-06-24 ENCOUNTER — Encounter
Admit: 2022-06-24 | Discharge: 2022-06-24 | Payer: No Typology Code available for payment source | Primary: Student in an Organized Health Care Education/Training Program

## 2022-06-25 ENCOUNTER — Encounter
Admit: 2022-06-25 | Discharge: 2022-06-25 | Payer: No Typology Code available for payment source | Primary: Student in an Organized Health Care Education/Training Program

## 2022-06-30 ENCOUNTER — Encounter
Admit: 2022-06-30 | Discharge: 2022-06-30 | Payer: No Typology Code available for payment source | Primary: Student in an Organized Health Care Education/Training Program

## 2022-07-03 ENCOUNTER — Encounter
Admit: 2022-07-03 | Discharge: 2022-07-03 | Payer: No Typology Code available for payment source | Primary: Student in an Organized Health Care Education/Training Program

## 2022-07-03 MED ORDER — MOUNJARO 2.5 MG/0.5 ML SC PNIJ
2.5 mg | SUBCUTANEOUS | 0 refills
Start: 2022-07-03 — End: ?

## 2022-07-03 MED ORDER — MOUNJARO 2.5 MG/0.5 ML SC PNIJ
2.5 mg | SUBCUTANEOUS | 0 refills | Status: CN
Start: 2022-07-03 — End: ?

## 2022-07-16 ENCOUNTER — Encounter
Admit: 2022-07-16 | Discharge: 2022-07-16 | Payer: No Typology Code available for payment source | Primary: Student in an Organized Health Care Education/Training Program

## 2022-07-16 MED ORDER — MOUNJARO 2.5 MG/0.5 ML SC PNIJ
2.5 mg | SUBCUTANEOUS | 0 refills | Status: AC
Start: 2022-07-16 — End: ?

## 2022-07-16 NOTE — Telephone Encounter
This RN called and spoke with pt via language line to notify that mounjaro 2.5mg  was sent to preferred pharmacy. Pt verbalized understanding with no further questions/concerns.    Elmer Bales, RN

## 2022-07-16 NOTE — Telephone Encounter
Received two voicemails with no message on VM. This RN utilized language line to call pt back and ensure nothing was needed. Pt states that he is out of mounjaro x 1 month and yesterday BG was increased to 226. Before the mounjaro, sugar level was at 200, after second injection it was 110. Pt has been nauseous with headache, weakness in legs, frequent urination disrupting sleep, polydipsia with BGs> 200. Pt is not taking ozempic currently and had no side effects/symptoms when on mounjaro consistently. Pt states he requested it from pharmacy but he was told a new RX is needed. This RN notified pt I would route to Dr. Modena Nunnery to ensure 2.5mg  dosage correct and reminded pt of TH appt with Dr. Modena Nunnery tomorrow. Pt verbalized understanding, verified correct pharmacy, and had no further questions/concerns.    Elmer Bales, RN

## 2022-07-17 ENCOUNTER — Encounter
Admit: 2022-07-17 | Discharge: 2022-07-17 | Payer: No Typology Code available for payment source | Primary: Student in an Organized Health Care Education/Training Program

## 2022-07-17 DIAGNOSIS — E7800 Pure hypercholesterolemia, unspecified: Secondary | ICD-10-CM

## 2022-07-17 DIAGNOSIS — E1151 Type 2 diabetes mellitus with diabetic peripheral angiopathy without gangrene: Secondary | ICD-10-CM

## 2022-07-17 NOTE — Progress Notes
This encounter was created in error. Please disregard. 

## 2022-08-06 ENCOUNTER — Encounter
Admit: 2022-08-06 | Discharge: 2022-08-06 | Payer: No Typology Code available for payment source | Primary: Student in an Organized Health Care Education/Training Program

## 2022-08-06 DIAGNOSIS — I10 Essential (primary) hypertension: Secondary | ICD-10-CM

## 2022-08-06 DIAGNOSIS — D179 Benign lipomatous neoplasm, unspecified: Secondary | ICD-10-CM

## 2022-08-06 DIAGNOSIS — I8390 Asymptomatic varicose veins of unspecified lower extremity: Secondary | ICD-10-CM

## 2022-08-06 DIAGNOSIS — E119 Type 2 diabetes mellitus without complications: Secondary | ICD-10-CM

## 2022-08-08 ENCOUNTER — Encounter
Admit: 2022-08-08 | Discharge: 2022-08-08 | Payer: No Typology Code available for payment source | Primary: Student in an Organized Health Care Education/Training Program

## 2022-08-08 NOTE — Telephone Encounter
-----   Message from Toula Moos, MD sent at 08/07/2022  1:39 PM CDT -----  Please ask the patient to get the lab work done that I have ordered. This should be fasting and done as soon as possible

## 2022-08-08 NOTE — Telephone Encounter
Due to language barrier, an interpreter was present during the history-taking and subsequent discussion (and for part of the physical exam) with this patient.    Interpreter mode: Telephonic  Interpreter/ ID Number: [id number] Cristal Deer      Interpreter called Joyce Gross, patient answered and interpreter let patient know lab orders placed for him to have done as soon as possible. Interpreter let pt know labs are fasting labs and not to eat or drink anything 8-10 hours before having labs drawn.     Asked if pt know's where the lab is located, he does not. Let him know there is one at the main campus on [address] or one located on [address] by our office. Pt states he will stop by lab Monday to have them drawn. Let pt know to give our office a call if anything is needed. Pt understanding. Dorothy Spark, MA

## 2022-08-12 ENCOUNTER — Ambulatory Visit
Admit: 2022-08-12 | Discharge: 2022-08-12 | Payer: No Typology Code available for payment source | Primary: Student in an Organized Health Care Education/Training Program

## 2022-08-12 ENCOUNTER — Encounter
Admit: 2022-08-12 | Discharge: 2022-08-12 | Payer: No Typology Code available for payment source | Primary: Student in an Organized Health Care Education/Training Program

## 2022-08-12 DIAGNOSIS — E7800 Pure hypercholesterolemia, unspecified: Secondary | ICD-10-CM

## 2022-08-12 DIAGNOSIS — E1151 Type 2 diabetes mellitus with diabetic peripheral angiopathy without gangrene: Secondary | ICD-10-CM

## 2022-08-12 LAB — COMPREHENSIVE METABOLIC PANEL
ALK PHOSPHATASE: 48 U/L (ref 25–110)
ALT: 30 U/L (ref 7–56)
ANION GAP: 10 (ref 3–12)
AST: 24 U/L (ref 7–40)
CO2: 29 MMOL/L (ref 21–30)
EGFR: >60 mL/min (ref >60)
GLUCOSE,PANEL: 109 mg/dL — ABNORMAL HIGH (ref 70–100)
POTASSIUM: 4.5 MMOL/L (ref 3.5–5.1)
SODIUM: 142 MMOL/L (ref 137–147)

## 2022-08-12 LAB — LIPID PROFILE
CHOLESTEROL: 183 mg/dL (ref <200)
HDL: 36 mg/dL — ABNORMAL LOW (ref >40)
LDL: 117 mg/dL — ABNORMAL HIGH (ref <100)
NON HDL CHOLESTEROL: 147 mg/dL (ref 3.5–5.0)
TRIGLYCERIDES: 224 mg/dL — ABNORMAL HIGH (ref <150)
VLDL: 45 mg/dL (ref 0.3–1.2)

## 2022-08-12 LAB — TSH WITH FREE T4 REFLEX: TSH: 1.5 uU/mL (ref 0.35–5.00)

## 2022-08-12 LAB — HEMOGLOBIN A1C: HEMOGLOBIN A1C: 6.9 % — ABNORMAL HIGH (ref 4.0–5.7)

## 2022-08-20 ENCOUNTER — Encounter
Admit: 2022-08-20 | Discharge: 2022-08-20 | Payer: No Typology Code available for payment source | Primary: Student in an Organized Health Care Education/Training Program

## 2022-08-27 ENCOUNTER — Encounter
Admit: 2022-08-27 | Discharge: 2022-08-27 | Payer: No Typology Code available for payment source | Primary: Student in an Organized Health Care Education/Training Program

## 2022-08-28 ENCOUNTER — Encounter
Admit: 2022-08-28 | Discharge: 2022-08-28 | Payer: No Typology Code available for payment source | Primary: Student in an Organized Health Care Education/Training Program

## 2022-08-28 ENCOUNTER — Ambulatory Visit
Admit: 2022-08-28 | Discharge: 2022-08-28 | Payer: No Typology Code available for payment source | Primary: Student in an Organized Health Care Education/Training Program

## 2022-08-28 DIAGNOSIS — G4733 Obstructive sleep apnea (adult) (pediatric): Secondary | ICD-10-CM

## 2022-08-28 DIAGNOSIS — I10 Essential (primary) hypertension: Secondary | ICD-10-CM

## 2022-08-28 DIAGNOSIS — E8881 Metabolic syndrome: Secondary | ICD-10-CM

## 2022-08-28 DIAGNOSIS — I8390 Asymptomatic varicose veins of unspecified lower extremity: Secondary | ICD-10-CM

## 2022-08-28 DIAGNOSIS — E119 Type 2 diabetes mellitus without complications: Secondary | ICD-10-CM

## 2022-08-28 DIAGNOSIS — D179 Benign lipomatous neoplasm, unspecified: Secondary | ICD-10-CM

## 2022-08-28 DIAGNOSIS — I5032 Chronic diastolic (congestive) heart failure: Secondary | ICD-10-CM

## 2022-08-28 DIAGNOSIS — E7800 Pure hypercholesterolemia, unspecified: Secondary | ICD-10-CM

## 2022-08-28 DIAGNOSIS — E1151 Type 2 diabetes mellitus with diabetic peripheral angiopathy without gangrene: Secondary | ICD-10-CM

## 2022-08-28 DIAGNOSIS — E1159 Type 2 diabetes mellitus with other circulatory complications: Secondary | ICD-10-CM

## 2022-08-28 MED ORDER — OZEMPIC 1 MG/DOSE (2 MG/1.5 ML) SC PNIJ
1 mg | SUBCUTANEOUS | 0 refills | Status: AC
Start: 2022-08-28 — End: ?

## 2022-08-28 MED ORDER — OZEMPIC 2 MG/DOSE (8 MG/3 ML) SC PNIJ
2 mg | SUBCUTANEOUS | 3 refills | Status: AC
Start: 2022-08-28 — End: ?

## 2022-08-28 NOTE — Progress Notes
Due to language barrier, an interpreter was present during the history-taking and subsequent discussion (and for part of the physical exam) with this patient.    Interpreter mode: Telephonic  Interpreter/ ID Number: [id number], Marcelino Freestone interpreter services to contact pt for check in for tele health visit with Dr. Christain [place]. Interpreter could not leave message, as Vm box is not set up at this time. Dorothy Spark, MA      Due to language barrier, an interpreter was present during the history-taking and subsequent discussion (and for part of the physical exam) with this patient.    Interpreter mode: Transport planner  Interpreter/ ID Number: #[id number] Caron Presume    Patient was able to get connected to video visit with provider and interpreter. Dorothy Spark, MA

## 2022-08-28 NOTE — Progress Notes
Due to language barrier, an interpreter was present during the history-taking and subsequent discussion (and for part of the physical exam) with this patient.    Interpreter mode: Telephonic  Interpreter/ ID Number: [id number], [facility]  Follow Up Visit    Date of Service: 08/28/2022  PCP: [name] A  DOB: Dec 12, 1980  MRN#: [id number]    Danny Kidd is a 42 y.o. male.        History of Present Illness  Danny Kidd is a 42 y.o. male with  has a past medical history of DM (diabetes mellitus) (HCC), Hypertension, Lipoma, and Varicose veins of lower extremity. presenting for follow up discussion of obesity and weight management.    Nutrition: Trying to decrease portion sizes and eat healthier choices. Has not started working with NC. Breakfast - avoiding carbs and simple sugars. Lunch/Dinner - mostly avoids rice. Eating salads    Activity: minimal. Notes DOE.    Medication: Mounjaro on backorder. Currently on ozempic 0.5mg  - no issues      Fasting am blood glucose <110. After breakfast <130.    He is very interested in bariatric surgery but his insurance denied this option. He would like to appeal.      Review of Systems   Constitution: Positive for weight gain.  CV: Negative for chest pain, + SOB  Gastrointestinal: Negative for abd pain, n/v/d/c    Objective:     Physical Exam  Constitutional: Well developed, well nourished, no distress   HENT: Normocephalic, atraumatic  Cardiac: RRR, no murmur  Respiratory: Normal effort, clear to auscultation, no respiratory distress  Neurologic: Alert  Psych: mood /affect normal. Behavior normal. Thought content normal. Judgement normal.    Medications-   cetirizine (ZYRTEC) 10 mg tablet Take one tablet by mouth daily as needed for Allergy symptoms.    gabapentin (NEURONTIN) 300 mg capsule Take one capsule by mouth three times daily.    MAGNESIUM CITRATE PO Take 1 tablet by mouth every evening.    metFORMIN (GLUCOPHAGE) 1,000 mg tablet Take one tablet by mouth twice daily after meals.    rosuvastatin (CRESTOR) 20 mg tablet Take one tablet by mouth daily.    sacubitriL-valsartan (ENTRESTO) 24-26 mg tablet Take one tablet by mouth twice daily. Start 3 days after you stop taking Lisinopril    semaglutide (OZEMPIC) 0.25 mg or 0.5 mg (2 mg/3 mL) injection PEN Inject one-half mg under the skin every 7 days for 28 days.    [START ON 09/06/2022] semaglutide (OZEMPIC) 1 mg/dose (2 mg/1.5 mL) pen injector Inject one mg under the skin every 7 days. Indications: type 2 diabetes mellitus    spironolactone (ALDACTONE) 25 mg tablet Take one tablet by mouth daily. Take with food.  Indications: fluid in the lungs due to chronic heart failure    tirzepatide (MOUNJARO) 2.5 mg/0.5 mL injector PEN Inject 0.5 mL under the skin every 7 days.    torsemide (DEMADEX) 20 mg tablet Take one tablet by mouth daily. Indications: fluid in the lungs due to chronic heart failure     Recent Labs:  Basic Metabolic Profile    Lab Results   Component Value Date/Time    NA 142 08/12/2022 09:05 AM    K 4.5 08/12/2022 09:05 AM    CA 9.1 08/12/2022 09:05 AM    CL 103 08/12/2022 09:05 AM    CO2 29 08/12/2022 09:05 AM    GAP 10 08/12/2022 09:05 AM    Lab  Results   Component Value Date/Time    BUN 19 08/12/2022 09:05 AM    CR 1.18 08/12/2022 09:05 AM    GLU 109 (H) 08/12/2022 09:05 AM        Hepatic Function    Lab Results   Component Value Date/Time    ALBUMIN 4.2 08/12/2022 09:05 AM    TOTPROT 6.9 08/12/2022 09:05 AM    ALKPHOS 48 08/12/2022 09:05 AM    Lab Results   Component Value Date/Time    AST 24 08/12/2022 09:05 AM    ALT 30 08/12/2022 09:05 AM    TOTBILI 0.7 08/12/2022 09:05 AM        TSH   Lab Results   Component Value Date/Time    TSH 1.55 08/12/2022 09:05 AM      Lab Draw:  Lab Results   Component Value Date/Time    HGBA1C 6.9 (H) 08/12/2022 09:05 AM    HGBA1C 7.6 (H) 01/09/2022 04:35 PM    HGBA1C 7.1 (H) 01/01/2022 08:14 PM     POC:  No results found for: A1C   Lab Results   Component Value Date    CHOL 183 08/12/2022    TRIG 224 (H) 08/12/2022    HDL 36 (L) 08/12/2022    LDL 117 (H) 08/12/2022    VLDL 45 08/12/2022    NONHDLCHOL 147 08/12/2022      Wt Readings from Last 5 Encounters:   08/06/22 127.6 kg (281 lb 6.4 oz)   03/26/22 124.7 kg (275 lb)   02/18/22 133.4 kg (294 lb)   02/18/22 127.5 kg (281 lb)   01/09/22 127.8 kg (281 lb 12.8 oz)     BP Readings from Last 3 Encounters:   08/06/22 (!) 141/79   03/26/22 (!) 142/77   02/18/22 (!) 148/75      Telehealth Patient Reported Vitals       Row Name 08/28/22 1416                Weight: 127.5 kg (281 lb)        Height: 165.1 cm (5' 5)        Pain Score: Zero                            Assessment and Plan:  Obesity-   Initial weight & BMI: 275, 45 03/2022  Today's weight & BMI:   Wt Readings from Last 1 Encounters:   08/06/22 127.6 kg (281 lb 6.4 oz)   There is no height or weight on file to calculate BMI.  Total weight loss to date:  +6 #,  0 %TBWL     Treatment:  Plan/ level of monitoring: Nutrition Counseling and Pharmacotherapy. Will also try to appeal bariatric surgery  Current Medication - Increase Ozempic as tolerated  Failed medication - none  Discussion of safety/side effects     Obesity Management goals:    Further obesity management recommendations:   Annual screen for a assessment of Type II diabetes, HTN, Hyperlipidemia, Depression, OSA, NAFLD, Vitamin D deficiency and renal disease.   Given the association of obesity with cancer, all age appropriate cancer screenings should be UTD  Medication Review: If possible medication that promote weight gain should be avoided and medication that are weight-neutral or promote weight loss should be considered.     Comorbid Conditions:  T2DM - continue Ozempic., Continue metformin.  Metabolic Syndrome with HPL and HTN - improved  HFpEF -  never had post-hospitalization fu. Needs to establish with cardiology. Referral placed  OSA    Total Time Today was 30 minutes in the following activities: Preparing to see the patient, Obtaining and/or reviewing separately obtained history, Performing a medically appropriate examination and/or evaluation, Counseling and educating the patient/family/caregiver, Ordering medications, tests, or procedures, Referring and communication with other health care professionals (when not separately reported), and Documenting clinical information in the electronic or other health record      No future appointments.

## 2022-09-12 ENCOUNTER — Encounter
Admit: 2022-09-12 | Discharge: 2022-09-12 | Payer: No Typology Code available for payment source | Primary: Student in an Organized Health Care Education/Training Program

## 2022-09-22 ENCOUNTER — Encounter
Admit: 2022-09-22 | Discharge: 2022-09-22 | Payer: No Typology Code available for payment source | Primary: Student in an Organized Health Care Education/Training Program

## 2022-10-13 ENCOUNTER — Encounter: Admit: 2022-10-13 | Discharge: 2022-10-13 | Payer: No Typology Code available for payment source

## 2022-11-06 ENCOUNTER — Encounter: Admit: 2022-11-06 | Discharge: 2022-11-06 | Payer: No Typology Code available for payment source

## 2022-11-06 ENCOUNTER — Ambulatory Visit: Admit: 2022-11-06 | Discharge: 2022-11-06 | Payer: No Typology Code available for payment source

## 2022-11-06 DIAGNOSIS — E041 Nontoxic single thyroid nodule: Secondary | ICD-10-CM

## 2022-11-06 DIAGNOSIS — R9389 Abnormal findings on diagnostic imaging of other specified body structures: Secondary | ICD-10-CM

## 2022-11-11 ENCOUNTER — Encounter: Admit: 2022-11-11 | Discharge: 2022-11-11 | Payer: No Typology Code available for payment source

## 2022-11-11 DIAGNOSIS — I8390 Asymptomatic varicose veins of unspecified lower extremity: Secondary | ICD-10-CM

## 2022-11-11 DIAGNOSIS — D179 Benign lipomatous neoplasm, unspecified: Secondary | ICD-10-CM

## 2022-11-11 DIAGNOSIS — I10 Essential (primary) hypertension: Secondary | ICD-10-CM

## 2022-11-11 DIAGNOSIS — E119 Type 2 diabetes mellitus without complications: Secondary | ICD-10-CM

## 2022-11-11 NOTE — Patient Education
Dear Emeline Gins,    Thank you for choosing [facility] Interventional Radiology for your procedure. Your appointment information is listed below:    Appointment Date:  11/13/22  Arrival Time for Check-in:  8:00 AM  Appointment Time:  9:00 AM    Location:   [employer]: [address] [place], [place]  [address]  Parking: P5 Parking Garage  Check-in at Admissions Desk in [place]          INTERVENTIONAL RADIOLOGY  PRE-PROCEDURE INSTRUCTIONS LOCAL    You are scheduled for a procedure in Interventional Radiology.  Please follow these instructions and any direction from your Primary Care/Managing Physician.  If you have questions about your procedure or need to reschedule please call [phone].      Medication Instructions:  Continue scheduled medication.       Diet Instructions: Maintain regular diet with no restrictions.     Day of Exam Instructions:  Bathe or shower with an antibacterial soap prior to your appointment.  Bring a list of your current medications and the dosages.  Wear comfortable clothing and leave valuables at home.  Arrive (1) hour prior to your appointment.  This time will be spent registering, interviewing, assessing, educating, and preparing you for the test.  You will be with [place] anywhere from 30 minutes to 2 hours after your exam depending on your procedure.  Depending on the procedure and as instructed by the nurse a responsible adult may be required to drive you home (no Benedetto Goad, taxis or buses are allowed). If a driver is required and you do not have one  we will be unable to perform your procedure.   The nurse will give you discharge instructions about your care and activities after the procedure.

## 2022-11-11 NOTE — Progress Notes
Interventional Radiology Outpatient Scheduling Checklist      1.  Name of Procedure(s):   Thyroid Biopsy    NOTE:  Spanish speaking patient; Interpreter requested.       2.  Date of Procedure:   11/13/22      3.  Arrival Time:   0800      4.  Procedure Time:  0900        5.  Correct Procedural Room Assignment:  CA Room 2      6.  Blood Thinners Triaged and instructed per protocol: Y/N/NA:  NA  Confirmed accurate instructions sent to patient: Y/N:  NA       7.  Procedure Order Verified: Y/N:  Yes      8.  Patient instructed to have a driver: Y/N/NA:  NA      9.  Patient instructed on NPO status: Y/N/NA:  NA  Confirmed accurate instructions sent to patient: Y/N:  Yes    10.  Specimen needed: Y/N/NA:  Yes   Verified Order placed: Y/N:  Yes    11.  Allergies Verified:  Y/N:  Yes    12.  Has the patient ever had a procedure with contrast (heart cath, CT scan, IVP, etc)?  NA    13. Has the patient ever had an adverse reaction to iodine or iodinated contrast, including but not limited to trouble breathing, swelling around the face or throat, itching, or rash? No  Does the Procedure Require contrast: Y/N:  NO   If so, was the IR- Contrast Allergy Pre-Procedure Medication protocol ordered: Y/NA:  NA    14. Has the allergy list been updated? No    15.  Does the patient have labs according to IR Pre-procedure Laboratory Parameter policy: Y/N/NA:  NA  If No, was the patient instructed to obtain labs prior to procedure: Y/N/NA:  NA     16.  Will the patient need to be admitted or have a possible admission: Y/N:  No  If yes, confirmed accurate instructions sent to patient: Y/N/NA:  NA     17.  Patient States Understanding: Y/N:  Yes    18.  History of OSA:  Y/N:  Yes. However, local procedure.  If yes, confirm request to bring CPAP sent to patient: Y/N/NA:  NA    19. Does the patient have an insulin pump or continuous glucose monitor? Y/N: NO   If yes, was the patient instructed that this will need to be removed for this procedure and to bring supplies to reapply once the procedure is complete? Y/N    20. Patient was sent electronic procedure instructions: Y/N:  No; pt refused my chart; scheduled w/assist of Spanish Interpreter Karina ID [phone]    21.  Is patient scheduled with sedation? Y/N: NO         Taking GLP-1 agonist (Ex: Weekly: Bydureon, Mounjaro, Ozempic, Trulicity, Breaks, Zepbound; Daily: Adlyxine, Rybelsus, Saxenda, Victoza) Y/N: NA         Instruct to hold for a week? Y/N/NA: NA         Instruct to hold day of procedure? Y/N/NA: NA

## 2022-11-13 ENCOUNTER — Ambulatory Visit: Admit: 2022-11-13 | Discharge: 2022-11-13 | Payer: No Typology Code available for payment source

## 2022-11-13 ENCOUNTER — Encounter: Admit: 2022-11-13 | Discharge: 2022-11-13 | Payer: No Typology Code available for payment source

## 2022-11-13 DIAGNOSIS — D179 Benign lipomatous neoplasm, unspecified: Secondary | ICD-10-CM

## 2022-11-13 DIAGNOSIS — I10 Essential (primary) hypertension: Secondary | ICD-10-CM

## 2022-11-13 DIAGNOSIS — E041 Nontoxic single thyroid nodule: Secondary | ICD-10-CM

## 2022-11-13 DIAGNOSIS — E119 Type 2 diabetes mellitus without complications: Secondary | ICD-10-CM

## 2022-11-13 DIAGNOSIS — I8390 Asymptomatic varicose veins of unspecified lower extremity: Secondary | ICD-10-CM

## 2022-11-13 NOTE — Unmapped
Immediate Post Procedure Note    Date:  11/13/2022                                         Attending Physician:   Dr. Harolyn Rutherford  Performing Provider:  Ardyth Man, MD    Consent:  Consent obtained from patient.  Time out performed: Consent obtained, correct patient verified, correct procedure verified, correct site verified, patient marked as necessary.  Pre/Post Procedure Diagnosis:  Left thyroid nodule  Indications:  Diagnostic evaluation      Procedure(s):  US guided biopsy  Findings:  Two FNA passes of LEFT inferior/deep thyroid nodule     Estimated Blood Loss:  None/Negligible  Specimen(s) Removed/Disposition:  Yes, sent to pathology and Cytology present  Complications: None  Patient Tolerated Procedure: Well  Post-Procedure Condition:  stable    Ardyth Man, MD

## 2022-11-13 NOTE — Progress Notes
Pt arrived to IR pre/post for procedure work up.     Mobility status: Ambulatory  Procedure reviewed using Spanish interpreter and pt verbalized understanding.   Pt cart locked in low position, call light within reach.   See doc flowsheets for further details.    Will continue to monitor patient.

## 2022-11-13 NOTE — Progress Notes
Pt recovery complete.     Ambulatory status: Ambulatory.   Vitals stable. Dressing is clean, dry, and intact.  Pain level returned to baseline.  Discharge instructions reviewed with: patient, by Ricki Rodriguez RN in Spanish.  AVS provided to patient.    Pt discharged to main lobby.

## 2022-11-13 NOTE — H&P (View-Only)
IR Pre-Procedure History and Physical/Sedation Plan    Procedure Date: 11/13/2022     Planned Procedure(s):  Thyroid nodule FNA    Indication:  Workup  __________________________________________________________________    Chief Complaint:  Left thyroid nodule    History of Present Illness: Danny Kidd is a 42 y.o. male with a history as listed below who presents today for procedure.    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 Conley Simmonds MICROPHLEBECTOMY Right 06/20/2021    RMP Dr. Junita Push    EXCISION OF LEFT ABDOMINAL WALL MASS Left 08/19/2021    Performed by Dellia Cloud, DO at [facility] OR    VARICOSE VEIN SURGERY Right 04/03/2022    percutaneous ablation of the right perforator veins- Dr. Junita Push      Social History     Tobacco Use    Smoking status: Some Days     Current packs/day: 0.00     Types: Cigarettes     Last attempt to quit: 12/19/2021     Years since quitting: 0.9     Passive exposure: Current    Smokeless tobacco: Never    Tobacco comments: 2-3 cigarettes on Sundays with family     Previously smoked for 8 years half pack a day (~4 pack years)   Substance Use Topics    Alcohol use: Not Currently     Comment: About a drink a week      No family history on file.   Medications Prior to Admission   Medication Sig Dispense Refill Last Dose    cetirizine (ZYRTEC) 10 mg tablet Take one tablet by mouth daily as needed for Allergy symptoms.       gabapentin (NEURONTIN) 300 mg capsule Take one capsule by mouth three times daily. 90 capsule 1     MAGNESIUM CITRATE PO Take 1 tablet by mouth every evening.       metFORMIN (GLUCOPHAGE) 1,000 mg tablet Take one tablet by mouth twice daily after meals. 180 tablet 3 11/13/2022    rosuvastatin (CRESTOR) 20 mg tablet Take one tablet by mouth daily. 90 tablet 1     sacubitriL-valsartan (ENTRESTO) 24-26 mg tablet Take one tablet by mouth twice daily. Start 3 days after you stop taking Lisinopril 180 tablet 1 11/13/2022    tirzepatide (MOUNJARO) 2.5 mg/0.5 mL injector PEN Inject 0.5 mL under the skin  every 7 days. 2 mL 0      No Known Allergies    Review of Systems  A comprehensive review of systems was negative.    Physical Exam:  Vital Signs: Last Filed In 24 Hours Vital Signs: 24 Hour Range   BP: 161/108 (07/25 0830)  Pulse: 95 (07/25 0830)  SpO2: 94 % (07/25 0830) BP: (161)/(108)   Pulse:  [95]   SpO2:  [94 %]           General appearance: Alert and no distress noted.  Neurologic: Grossly normal.  Lungs: Non labored at rest  Heart: Regular rate and rhythm  Abdomen: Non-distended    Pre-procedure anxiolysis plan: N/A  Sedation/Medication Plan: Local anesthetic  Personal history of sedation complications: Denies adverse event.   Family history of sedation complications: Denies adverse event.   Medications for Reversal: NA  Discussion/Reviews:  Physician has discussed risks and alternatives of this type of sedation and above planned procedures with patient    NPO Status: NA  Airway:  NA  Head and Neck: NA  Mouth: NA Anesthesia Classification:  ASA III (A patient with a severe systemic disease that limits activity, but is not incapacitating)  Pregnancy Status: N/A    Lab/Radiology/Other Diagnostic Tests:  Labs:  Pertinent labs reviewed           Wesley Blas, APRN-NP  Pager [phone]

## 2022-11-14 ENCOUNTER — Encounter: Admit: 2022-11-14 | Discharge: 2022-11-14 | Payer: No Typology Code available for payment source

## 2022-11-14 DIAGNOSIS — E041 Nontoxic single thyroid nodule: Secondary | ICD-10-CM

## 2022-11-14 NOTE — Telephone Encounter
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  I initiated a phone call to Danny Kidd with [employer] [place] [id number].  I introduced myself to Danny Kidd with the interpreter.  I informed Danny Kidd of the results of his biopsy and that I recommend a quick referral to our head and neck cancer specialists here at [facility].  Patient voiced understanding of his biopsy and inquired regarding insurance coverage as well as prognosis of his condition.  I informed him that I am not an expert in thyroid carcinoma, but did discuss with him that papillary thyroid carcinoma tends to have a better prognosis than other thyroid cancers.  Patient did not have any further questions regarding his results.  He thanked me for the phone call.  I informed him to give the clinic a call if he had any further questions.    Teena Irani. Manson Passey, M.D. - PGY-2  Department of [facility] and [facility]

## 2022-11-17 ENCOUNTER — Encounter: Admit: 2022-11-17 | Discharge: 2022-11-17 | Payer: No Typology Code available for payment source

## 2022-11-17 NOTE — Progress Notes
Appointment Notes:  PTC  Future Appointments   Date Time Provider Department Center   11/26/2022  2:20 PM Toula Moos, MD [facility] IM   12/08/2022  9:00 AM Bur, Verdie Mosher, MD [facility]NT       Referring Provider:  Cindie Crumbly    Location of Films: IN HOUSE     Location of Pathology Tissue: IN HOUSE     Contact Summary:    42 y/o spanish speaking male recently diagnosed with PTC. The left thyroid nodule was seen on CT Chest 05/28/22. subsequently, pt was sent for US thyroid 11/06/22 which showed solitary 2.6 cm TR 5 nodule in the inferior aspect of the left thyroid lobe. Pt underwent FNA of the left thyroid nodule 11/13/22, path showed PTC.     Pt denies symptoms. denies family hx of thyroid disease/ca.    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  Past Medical History:   Diagnosis Date    DM (diabetes mellitus) (HCC)     Hypertension     Lipoma     left mid-flank region    Varicose veins of lower extremity      Surgical History:   Procedure Laterality Date    HX ENDOVENOUS ABLATION OF THE GREAT SAPHENOUS VEIN Right 06/20/2021    Dr. Conley Simmonds MICROPHLEBECTOMY Right 06/20/2021    RMP Dr. Junita Push    EXCISION OF LEFT ABDOMINAL WALL MASS Left 08/19/2021    Performed by Dellia Cloud, DO at [facility] OR    VARICOSE VEIN SURGERY Right 04/03/2022    percutaneous ablation of the right perforator veins- Dr. 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 pt is scheduled with provider who can speak spanish)      Oncofertility Age 5 and under:    Onc Fertility Assessment: Discussion not appropriate at this time    Onc Fertility Intervention: Provided information about available services      Patient Education  -provided appt info and appt guide sent to pt via mail  - Informed pt that the appt is a consult visit with surgeon to discuss the diagnosis and recommendations.     Topics Discussed   general information about Papillary Thyroid Carcinoma

## 2022-11-26 ENCOUNTER — Encounter: Admit: 2022-11-26 | Discharge: 2022-11-26 | Payer: No Typology Code available for payment source

## 2022-12-03 ENCOUNTER — Encounter: Admit: 2022-12-03 | Discharge: 2022-12-03 | Payer: No Typology Code available for payment source

## 2022-12-08 ENCOUNTER — Encounter: Admit: 2022-12-08 | Discharge: 2022-12-08 | Payer: No Typology Code available for payment source

## 2022-12-08 ENCOUNTER — Ambulatory Visit: Admit: 2022-12-08 | Discharge: 2022-12-08 | Payer: No Typology Code available for payment source

## 2022-12-08 ENCOUNTER — Ambulatory Visit: Admit: 2022-12-08 | Discharge: 2022-12-09 | Payer: No Typology Code available for payment source

## 2022-12-08 DIAGNOSIS — D179 Benign lipomatous neoplasm, unspecified: Secondary | ICD-10-CM

## 2022-12-08 DIAGNOSIS — I10 Essential (primary) hypertension: Secondary | ICD-10-CM

## 2022-12-08 DIAGNOSIS — I8390 Asymptomatic varicose veins of unspecified lower extremity: Secondary | ICD-10-CM

## 2022-12-08 DIAGNOSIS — E119 Type 2 diabetes mellitus without complications: Secondary | ICD-10-CM

## 2022-12-08 DIAGNOSIS — C73 Malignant neoplasm of thyroid gland: Secondary | ICD-10-CM

## 2022-12-08 MED ORDER — ENTRESTO 24-26 MG PO TAB
1 | ORAL_TABLET | Freq: Two times a day (BID) | ORAL | 1 refills | 30.00000 days | Status: AC
Start: 2022-12-08 — End: ?
  Filled 2022-12-27: qty 60, 30d supply, fill #1

## 2022-12-08 NOTE — Patient Instructions
General Instructions for Surgery Patients    You surgery is scheduled for 12/24/2022 with Dr. Lavinia Sharps    Your nurse coordinator is Victorino Dike and Rande Brunt Information:  Main Office: [phone], hours Monday thru Friday 8:00am-4:30pm option #1.      GENERAL INSTRUCTIONS:       Where am I having Surgery?      Surgery is scheduled at [facility]. The address is [address], [place], [place] [address]. [facility] is on the main hospital campus at the [employer] [address] [employer].    Park in the P5 parking garage off [employer].   Psychologist, sport and exercise the [employer] [facility] through the 1st floor main entrance.   Check in with admitting on 1st floor.   (If you are pre-registered you will go directly to either 2nd floor if going to IR, or 3rd floor to surgery).   Check in with surgical reception center (Waiting Room Attendant) after being registered.  You will be escorted by staff up to OR.  Family will need to expect to wait on 1st level in common areas except during certain points in the visit when they can accompany you.     YOU WILL BE CONTACT THE BUSINESS DAY BEFORE SURGERY, 12/23/22 , AND NOTIFIED OF YOUR ARRIVAL TIME FOR SURGERY THE NEXT DAY. You will be called between 2:30 and 4:00      Preparing for Surgery      ? You will get a call from a member of our preregistration team to complete your registration.   If you have any insurance changes prior to your surgery please call [phone] to provide   the updated insurance information.  ? Hold Vitamin E, Vitamin A, Fish oil, multivitamin, and herbal supplements for 14     days prior to surgery.  Hold Ibuprofen (Advil, Motrin), Naproxen (Aleve), and any     other Non-Steroidal Anti-inflammatory medications for 7 days prior to surgery.  If     you take aspirin or any blood thinners, please contact the prescribing physician     for recommendation regarding holding these medications prior to surgery.      Do not stop taking any heart medications or aspirin without seeking the     Approval of your Cardiologist.    ? Refrain from smoking two weeks before and two weeks after surgery.  Nicotine     and tobacco smoke delays healing and can result in scarring. This is the perfect     time to give up the habit.    ? Do not eat or drink anything, including water, after midnight the night before your     surgery.    ? Arrange for someone to take you home from the hospital.      The Day of Surgery      ? Do not eat or drink anything, including water, after midnight the morning of     surgery.      Essential medication may be taken with a sip of water.    ? Wear loose-fitting clothes that fasten in front or back.  Avoid clothing that pulls     over your head.    ? Leave all valuables at home; do not wear jewelry.    ? Do not wear any facial or eye make-up.  Avoid nail polish.    ? You may wear glasses but do not wear contact lenses.   ?  If you wear dentures, keep them in.    ? Bring ID and insurance card with you.       If you are concerned about anything you consider significant, call us at [phone]--during business hours, ask for your nurse, Sam, or the on-call nurse.   After hours call [phone] ask to speak to the ENT doctor on call.      Please call our office if the following should develop after surgery:  a) Cold or sinus infection  b) Foul smelling or excessive drainage from the incision site  c) Temperature greater than 101  d) Excessive swelling or bleeding  e) Persistent nausea or vomiting

## 2022-12-08 NOTE — Progress Notes
Chief Complaint   Patient presents with    New Patient     New, PTC, O2. IB           History of Present Illness:   Danny Kidd is a 42 y.o. year old male evaluated on 12/08/2022, in the Otolaryngology-Head and Neck Surgery Clinic at the [facility]. The patient was referred by Dr. Manson Kidd for evaluation of 42 y/o spanish speaking male recently diagnosed with PTC. The left thyroid nodule was seen on CT Chest 05/28/22. subsequently, pt was sent for US thyroid 11/06/22 which showed solitary 2.6 cm TR 5 nodule in the inferior aspect of the left thyroid lobe. Pt underwent FNA of the left thyroid nodule 11/13/22, path showed PTC.      Pt denies symptoms. denies family hx of thyroid disease/ca. No voice changes         Cancer treatment summary:   Cancer Staging   Malignant neoplasm of thyroid gland [facility])  Staging form: Thyroid - Differentiated, AJCC 8th Edition  - Clinical stage from 12/08/2022: Stage I (cT2, cN0, cM0, Age at diagnosis: < 55 years) - Signed by Danny Bruce, MD on 12/08/2022             Past Medical/Surgical History  He  has a past medical history of DM (diabetes mellitus) (HCC), Hypertension, Lipoma, and Varicose veins of lower extremity.   Surgical History:   Procedure Laterality Date    HX ENDOVENOUS ABLATION OF THE GREAT SAPHENOUS VEIN Right 06/20/2021    Dr. Conley Kidd MICROPHLEBECTOMY Right 06/20/2021    RMP Dr. Junita Kidd    EXCISION OF LEFT ABDOMINAL WALL MASS Left 08/19/2021    Performed by Danny Cloud, DO at [facility] OR    VARICOSE VEIN SURGERY Right 04/03/2022    percutaneous ablation of the right perforator veins- Dr. Junita Kidd     Past surgical history reviewed and is otherwise noncontributory.       Past Family/Social History  No family history on file.  Family history reviewed and is otherwise noncontributory.   He  reports that he has been smoking cigarettes. He has been exposed to tobacco smoke. He has never used smokeless tobacco. He reports that he does not currently use alcohol. He reports that he does not use drugs.    Medications/Allergies/Immunizations  His current medication(s) include:   Current Outpatient Medications   Medication Sig Dispense Refill    cetirizine (ZYRTEC) 10 mg tablet Take one tablet by mouth daily as needed for Allergy symptoms.      gabapentin (NEURONTIN) 300 mg capsule Take one capsule by mouth three times daily. 90 capsule 1    MAGNESIUM CITRATE PO Take 1 tablet by mouth every evening.      metFORMIN (GLUCOPHAGE) 1,000 mg tablet Take one tablet by mouth twice daily after meals. 180 tablet 3    rosuvastatin (CRESTOR) 20 mg tablet Take one tablet by mouth daily. 90 tablet 1    sacubitriL-valsartan (ENTRESTO) 24-26 mg tablet Take one tablet by mouth twice daily. Start 3 days after you stop taking Lisinopril 180 tablet 1    tirzepatide (MOUNJARO) 2.5 mg/0.5 mL injector PEN Inject 0.5 mL under the skin every 7 days. 2 mL 0     No current facility-administered medications for this visit.       Allergies: Patient has no known allergies.     Review of Systems   Constitutional: Negative for fever, weight loss and weight gain.  Skin: Negative for rash, itchiness, dryness  HENT: Negative for ear pain, sore throat and hoarseness.  Negative for difficulty swallowing.  Cardiovascular: Negative for chest pain and dyspnea on exertion (Can climb up 2 floors).   Respiratory: Is not experiencing shortness of breath.   Gastrointestinal: Negative for nausea and vomiting.   Neurological: Negative for headaches.   Lymph/Heme: Negative for lymphadenopathy or easy bruising  Musculoskeletal: Negative for joint or muscle pain  Psychiatric: The patient is not nervous/anxious.        All other systems are negative except for that listed in the HPI.      PHYSICAL EXAM:   Vital Signs:  BP (!) 138/96 (BP Source: Arm, Left Upper, Patient Position: Sitting)  - Pulse 91  - Temp 37 ?C (98.6 ?F) (Temporal)  - Ht 165.1 cm (5' 5)  - Wt 123.7 kg (272 lb 12.8 oz)  - SpO2 95%  - PF 88 L/min  - BMI 45.40 kg/m?      General:  Well-developed, well-nourished  Communication and Voice:  Clear pitch and clarity  Hearing: Hearing adequate for verbal communication bilaterally   Inspection:  Normocephalic and atraumatic without mass or lesion  Palpation:  Facial skeleton intact without bony stepoffs  Parotid Glands:  No mass or tenderness  Facial Strength:  Facial motility symmetric and full bilaterally  Pinna:  External ear intact and fully developed  External canal:  Canal is patent with intact skin  Tympanic Membrane:  Clear and mobile  External nose:  No scar or anatomic deformity  Internal Nose:  Septum intact and midline.  No edema, polyp, or rhinorrhea.  TMJ:  No pain to palpation with full mobility  Oral cavity, Lips, Teeth, and Gums:  Mucosa and teeth intact and viable, No lesions, masses or ulcers  Oropharynx: No erythema or exudate, no masses or ulcerations, non-obstructive tonsils  Nasopharynx:  No mass or lesion with intact mucosa  Hypopharynx:  Not well visualized secondary to gagging  Larynx:  Not well visualized secondary to gagging  Neck, Trachea, Lymphatics:  Midline trachea without mass or lesion, no lymphadenopathy, obese  Thyroid:  No palpable mass or nodularity due to habitus  Eyes: No nystagmus with equal extraocular motion bilaterally  Neuro/Psych/Balance: Patient oriented and appropriate in interaction;  Appropriate mood and affect;  Gait is intact with no imbalance; Cranial nerves I-XII are intact  Respiratory effort:  Equal inspiration and expiration without stridor  Peripheral Vascular:  Warm extremities with equal pulses     PATHOLOGY REVIEW:      RADIOLOGIC REVIEW:         IMPRESSION:   My impression is 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     PLAN:      After review of the various options including the risks, benefits and alternatives, [name] has opted to proceed with surgery as part of his overall management which I believe is reasonable for his condition.      This will include left thyroid lobectomy versus total thyroidectomy. I discussed risks including recurrent laryngeal nerve injury causing hoarseness, dysphagia. We discussed possible need for total thyroidectomy depending on intraoperative findings.     We will hence proceed with scheduling surgery in the near future as well as order appropriate preoperative testing and evaluation prior to surgery. I believe that Mr. Mota has a good understanding of the issues involved and I answered all of his questions.

## 2022-12-09 DIAGNOSIS — E041 Nontoxic single thyroid nodule: Secondary | ICD-10-CM

## 2022-12-11 ENCOUNTER — Encounter: Admit: 2022-12-11 | Discharge: 2022-12-11 | Payer: No Typology Code available for payment source

## 2022-12-11 DIAGNOSIS — E1151 Type 2 diabetes mellitus with diabetic peripheral angiopathy without gangrene: Secondary | ICD-10-CM

## 2022-12-11 MED ORDER — METFORMIN 1,000 MG PO TAB
1000 mg | ORAL_TABLET | Freq: Two times a day (BID) | ORAL | 3 refills
Start: 2022-12-11 — End: ?

## 2022-12-24 ENCOUNTER — Encounter: Admit: 2022-12-24 | Discharge: 2022-12-24 | Payer: No Typology Code available for payment source

## 2022-12-24 ENCOUNTER — Ambulatory Visit: Admit: 2022-12-24 | Discharge: 2022-12-24 | Payer: No Typology Code available for payment source

## 2022-12-24 DIAGNOSIS — E119 Type 2 diabetes mellitus without complications: Secondary | ICD-10-CM

## 2022-12-24 DIAGNOSIS — I8390 Asymptomatic varicose veins of unspecified lower extremity: Secondary | ICD-10-CM

## 2022-12-24 DIAGNOSIS — I10 Essential (primary) hypertension: Secondary | ICD-10-CM

## 2022-12-24 DIAGNOSIS — D179 Benign lipomatous neoplasm, unspecified: Secondary | ICD-10-CM

## 2022-12-24 MED ORDER — GLYCOPYRROLATE 0.2 MG/ML IJ SOLN
INTRAVENOUS | 0 refills | Status: DC
Start: 2022-12-24 — End: 2022-12-24

## 2022-12-24 MED ORDER — PHENYLEPHRINE HCL IN 0.9% NACL 1 MG/10 ML (100 MCG/ML) IV SYRG
INTRAVENOUS | 0 refills | Status: DC
Start: 2022-12-24 — End: 2022-12-24

## 2022-12-24 MED ORDER — REMIFENTANYL 1000MCG IN NS 20ML (OR)
INTRAVENOUS | 0 refills | Status: DC
Start: 2022-12-24 — End: 2022-12-24
  Administered 2022-12-24 (×2): .05 ug/kg/min via INTRAVENOUS

## 2022-12-24 MED ORDER — SODIUM CHLORIDE 0.9 % IV SOLP
INTRAVENOUS | 0 refills | Status: DC
Start: 2022-12-24 — End: 2022-12-24

## 2022-12-24 MED ORDER — LIDOCAINE (PF) 200 MG/10 ML (2 %) IJ SYRG
INTRAVENOUS | 0 refills | Status: DC
Start: 2022-12-24 — End: 2022-12-24

## 2022-12-24 MED ORDER — DEXMEDETOMIDINE IN 0.9 % NACL 20 MCG/5 ML (4 MCG/ML) IV SYRG
INTRAVENOUS | 0 refills | Status: DC
Start: 2022-12-24 — End: 2022-12-24

## 2022-12-24 MED ORDER — ONDANSETRON HCL (PF) 4 MG/2 ML IJ SOLN
INTRAVENOUS | 0 refills | Status: DC
Start: 2022-12-24 — End: 2022-12-24

## 2022-12-24 MED ORDER — CEFAZOLIN 3 GRAM IV SOLR
INTRAVENOUS | 0 refills | Status: DC
Start: 2022-12-24 — End: 2022-12-24

## 2022-12-24 MED ORDER — PHENYLEPHRINE 40 MCG/ML IN NS IV DRIP (STD CONC)
INTRAVENOUS | 0 refills | Status: DC
Start: 2022-12-24 — End: 2022-12-24
  Administered 2022-12-24 (×2): .3 ug/kg/min via INTRAVENOUS

## 2022-12-24 MED ORDER — PROPOFOL INJ 10 MG/ML IV VIAL
INTRAVENOUS | 0 refills | Status: DC
Start: 2022-12-24 — End: 2022-12-24

## 2022-12-24 MED ORDER — SUCCINYLCHOLINE CHLORIDE 20 MG/ML IJ SOLN
INTRAVENOUS | 0 refills | Status: DC
Start: 2022-12-24 — End: 2022-12-24

## 2022-12-24 MED ORDER — PROPOFOL 10 MG/ML IV EMUL 100 ML (INFUSION)(AM)(OR)
INTRAVENOUS | 0 refills | Status: DC
Start: 2022-12-24 — End: 2022-12-24
  Administered 2022-12-24: 21:00:00 150 ug/kg/min via INTRAVENOUS

## 2022-12-24 MED ORDER — FENTANYL CITRATE (PF) 50 MCG/ML IJ SOLN
INTRAVENOUS | 0 refills | Status: DC
Start: 2022-12-24 — End: 2022-12-24

## 2022-12-24 MED ORDER — ARTIFICIAL TEARS (PF) SINGLE DOSE DROPS GROUP
OPHTHALMIC | 0 refills | Status: DC
Start: 2022-12-24 — End: 2022-12-24

## 2022-12-24 MED ADMIN — SODIUM CHLORIDE 0.9 % IV SOLP [27838]: 1000 mL | INTRAVENOUS | @ 19:00:00 | Stop: 2022-12-24 | NDC 00338004904

## 2022-12-24 MED ADMIN — HYDROMORPHONE (PF) 2 MG/ML IJ SYRG [163476]: 0.5 mg | INTRAVENOUS | Stop: 2022-12-25 | NDC 00409131203

## 2022-12-25 ENCOUNTER — Encounter: Admit: 2022-12-25 | Discharge: 2022-12-25 | Payer: No Typology Code available for payment source

## 2022-12-25 ENCOUNTER — Inpatient Hospital Stay: Admit: 2022-12-25 | Discharge: 2022-12-25 | Payer: No Typology Code available for payment source

## 2022-12-25 MED ADMIN — SACUBITRIL-VALSARTAN 24-26 MG PO TAB [325930]: 1 | ORAL | @ 04:00:00 | NDC 00078065920

## 2022-12-25 MED ADMIN — METFORMIN 500 MG PO TAB [10544]: 1000 mg | ORAL | @ 14:00:00 | NDC 70010006301

## 2022-12-25 MED ADMIN — ACETAMINOPHEN 325 MG PO TAB [101]: 650 mg | ORAL | @ 05:00:00 | Stop: 2022-12-25 | NDC 00904677361

## 2022-12-25 MED ADMIN — PHENOL 1.4 % MM SPRA [82893]: 2 | OROMUCOSAL | NDC 00536122858

## 2022-12-25 MED ADMIN — SACUBITRIL-VALSARTAN 24-26 MG PO TAB [325930]: 1 | ORAL | @ 14:00:00 | NDC 00078065920

## 2022-12-25 MED ADMIN — OXYCODONE 5 MG PO TAB [10814]: 5 mg | ORAL | @ 04:00:00 | NDC 00406055223

## 2022-12-25 MED ADMIN — ACETAMINOPHEN 160 MG/5 ML PO SOLN [100]: 650 mg | ORAL | @ 09:00:00 | NDC 00121197121

## 2022-12-25 MED ADMIN — ACETAMINOPHEN 160 MG/5 ML PO SOLN [100]: 650 mg | ORAL | @ 18:00:00 | NDC 00121197121

## 2022-12-25 MED ADMIN — OXYCODONE 5 MG PO TAB [10814]: 10 mg | ORAL | Stop: 2022-12-25 | NDC 00406055223

## 2022-12-25 MED ADMIN — METHOCARBAMOL 100 MG/ML IJ SOLN [4970]: 750 mg | INTRAVENOUS | @ 01:00:00 | Stop: 2022-12-25 | NDC 71288071611

## 2022-12-25 MED ADMIN — ACETAMINOPHEN 160 MG/5 ML PO SOLN [100]: 650 mg | ORAL | @ 22:00:00 | NDC 00121197121

## 2022-12-25 MED ADMIN — METFORMIN 500 MG PO TAB [10544]: 1000 mg | ORAL | @ 23:00:00 | NDC 70010006301

## 2022-12-25 MED ADMIN — HEPARIN, PORCINE (PF) 5,000 UNIT/0.5 ML IJ SYRG [95535]: 5000 [IU] | SUBCUTANEOUS | @ 22:00:00 | NDC 00409131611

## 2022-12-25 MED ADMIN — ROSUVASTATIN 10 MG PO TAB [88503]: 20 mg | ORAL | @ 14:00:00 | NDC 00904677961

## 2022-12-25 MED ADMIN — MAGNESIUM HYDROXIDE 400 MG/5 ML PO SUSP [79944]: 30 mL | NASOGASTRIC | @ 03:00:00 | NDC 00121043130

## 2022-12-25 MED ADMIN — ACETAMINOPHEN 160 MG/5 ML PO SOLN [100]: 650 mg | ORAL | @ 14:00:00 | NDC 00121197121

## 2022-12-25 MED ADMIN — HYDROMORPHONE (PF) 2 MG/ML IJ SYRG [163476]: 0.5 mg | INTRAVENOUS | Stop: 2022-12-25 | NDC 00409131203

## 2022-12-25 MED ADMIN — FUROSEMIDE 10 MG/ML IJ SOLN [3291]: 30 mg | INTRAVENOUS | @ 22:00:00 | Stop: 2022-12-25 | NDC 71288020304

## 2022-12-25 MED ADMIN — FUROSEMIDE 10 MG/ML IJ SOLN [3291]: 10 mg | INTRAVENOUS | @ 18:00:00 | Stop: 2022-12-25 | NDC 63323028001

## 2022-12-25 MED FILL — OXYCODONE 5 MG PO TAB: 5 mg | ORAL | 3 days supply | Qty: 12 | Fill #1 | Status: AC

## 2022-12-26 ENCOUNTER — Encounter: Admit: 2022-12-26 | Discharge: 2022-12-26 | Payer: No Typology Code available for payment source

## 2022-12-26 ENCOUNTER — Inpatient Hospital Stay: Admit: 2022-12-26 | Discharge: 2022-12-26 | Payer: No Typology Code available for payment source

## 2022-12-26 DIAGNOSIS — I10 Essential (primary) hypertension: Secondary | ICD-10-CM

## 2022-12-26 DIAGNOSIS — E119 Type 2 diabetes mellitus without complications: Secondary | ICD-10-CM

## 2022-12-26 DIAGNOSIS — I8390 Asymptomatic varicose veins of unspecified lower extremity: Secondary | ICD-10-CM

## 2022-12-26 DIAGNOSIS — D179 Benign lipomatous neoplasm, unspecified: Secondary | ICD-10-CM

## 2022-12-26 MED ADMIN — ACETAMINOPHEN 160 MG/5 ML PO SOLN [100]: 650 mg | ORAL | @ 12:00:00 | NDC 00121197121

## 2022-12-26 MED ADMIN — HEPARIN, PORCINE (PF) 5,000 UNIT/0.5 ML IJ SYRG [95535]: 5000 [IU] | SUBCUTANEOUS | @ 12:00:00 | NDC 00409131611

## 2022-12-26 MED ADMIN — ROSUVASTATIN 10 MG PO TAB [88503]: 20 mg | ORAL | @ 14:00:00 | NDC 00904677961

## 2022-12-26 MED ADMIN — HEPARIN, PORCINE (PF) 5,000 UNIT/0.5 ML IJ SYRG [95535]: 5000 [IU] | SUBCUTANEOUS | @ 03:00:00 | NDC 00409131611

## 2022-12-26 MED ADMIN — ACETAMINOPHEN 160 MG/5 ML PO SOLN [100]: 650 mg | ORAL | @ 03:00:00 | NDC 00121197121

## 2022-12-26 MED ADMIN — POTASSIUM CHLORIDE 20 MEQ PO TBTQ [35943]: 40 meq | ORAL | @ 23:00:00 | Stop: 2022-12-26 | NDC 00832532510

## 2022-12-26 MED ADMIN — ACETAMINOPHEN 160 MG/5 ML PO SOLN [100]: 650 mg | ORAL | @ 22:00:00 | NDC 00121197121

## 2022-12-26 MED ADMIN — SACUBITRIL-VALSARTAN 24-26 MG PO TAB [325930]: 1 | ORAL | @ 03:00:00 | NDC 00078065920

## 2022-12-26 MED ADMIN — HEPARIN, PORCINE (PF) 5,000 UNIT/0.5 ML IJ SYRG [95535]: 5000 [IU] | SUBCUTANEOUS | @ 18:00:00 | NDC 00409131611

## 2022-12-26 MED ADMIN — SODIUM CHLORIDE 0.9 % IJ SOLN [7319]: 10 mL | INTRAVENOUS | @ 21:00:00 | Stop: 2022-12-26 | NDC 00409488820

## 2022-12-26 MED ADMIN — SACUBITRIL-VALSARTAN 24-26 MG PO TAB [325930]: 1 | ORAL | @ 15:00:00 | NDC 00078065920

## 2022-12-26 MED ADMIN — PERFLUTREN LIPID MICROSPHERES 1.1 MG/ML IV SUSP [79178]: 2 mL | INTRAVENOUS | @ 21:00:00 | Stop: 2022-12-26 | NDC 11994001116

## 2022-12-26 MED ADMIN — METHOCARBAMOL 750 MG PO TAB [4972]: 750 mg | ORAL | @ 14:00:00 | NDC 70010077005

## 2022-12-26 MED ADMIN — METFORMIN 500 MG PO TAB [10544]: 1000 mg | ORAL | @ 14:00:00 | NDC 70010006301

## 2022-12-26 MED ADMIN — ACETAMINOPHEN 160 MG/5 ML PO SOLN [100]: 650 mg | ORAL | @ 18:00:00 | NDC 00121197121

## 2022-12-26 MED ADMIN — METFORMIN 500 MG PO TAB [10544]: 1000 mg | ORAL | @ 22:00:00 | NDC 70010006301

## 2022-12-26 MED ADMIN — FUROSEMIDE 10 MG/ML IJ SOLN [3291]: 40 mg | INTRAVENOUS | @ 22:00:00 | Stop: 2022-12-26 | NDC 71288020304

## 2022-12-27 ENCOUNTER — Encounter: Admit: 2022-12-27 | Discharge: 2022-12-27 | Payer: No Typology Code available for payment source

## 2022-12-27 MED ADMIN — HEPARIN, PORCINE (PF) 5,000 UNIT/0.5 ML IJ SYRG [95535]: 5000 [IU] | SUBCUTANEOUS | @ 03:00:00 | NDC 00409131611

## 2022-12-27 MED ADMIN — SACUBITRIL-VALSARTAN 24-26 MG PO TAB [325930]: 1 | ORAL | @ 02:00:00 | NDC 00078065920

## 2022-12-27 MED ADMIN — METFORMIN 500 MG PO TAB [10544]: 1000 mg | ORAL | @ 14:00:00 | Stop: 2022-12-28 | NDC 70010006301

## 2022-12-27 MED ADMIN — SACUBITRIL-VALSARTAN 24-26 MG PO TAB [325930]: 1 | ORAL | @ 16:00:00 | Stop: 2022-12-27 | NDC 00078065920

## 2022-12-27 MED ADMIN — OXYCODONE 5 MG PO TAB [10814]: 5 mg | ORAL | @ 04:00:00 | NDC 00406055223

## 2022-12-27 MED ADMIN — ACETAMINOPHEN 160 MG/5 ML PO SOLN [100]: 650 mg | ORAL | @ 21:00:00 | Stop: 2022-12-28 | NDC 00121197121

## 2022-12-27 MED ADMIN — OXYCODONE 5 MG PO TAB [10814]: 5 mg | ORAL | @ 16:00:00 | Stop: 2022-12-28 | NDC 00406055223

## 2022-12-27 MED ADMIN — TORSEMIDE 20 MG PO TAB [18293]: 20 mg | ORAL | @ 14:00:00 | Stop: 2022-12-28 | NDC 68084053911

## 2022-12-27 MED ADMIN — HEPARIN, PORCINE (PF) 5,000 UNIT/0.5 ML IJ SYRG [95535]: 5000 [IU] | SUBCUTANEOUS | @ 19:00:00 | Stop: 2022-12-28 | NDC 00409131611

## 2022-12-27 MED ADMIN — HEPARIN, PORCINE (PF) 5,000 UNIT/0.5 ML IJ SYRG [95535]: 5000 [IU] | SUBCUTANEOUS | @ 10:00:00 | Stop: 2022-12-28 | NDC 00409131611

## 2022-12-27 MED ADMIN — ACETAMINOPHEN 160 MG/5 ML PO SOLN [100]: 650 mg | ORAL | @ 17:00:00 | Stop: 2022-12-28 | NDC 00121197121

## 2022-12-27 MED ADMIN — ACETAMINOPHEN 160 MG/5 ML PO SOLN [100]: 650 mg | ORAL | @ 14:00:00 | Stop: 2022-12-28 | NDC 00121197121

## 2022-12-27 MED ADMIN — SPIRONOLACTONE 25 MG PO TAB [7437]: 25 mg | ORAL | @ 02:00:00 | NDC 00904692761

## 2022-12-27 MED ADMIN — ACETAMINOPHEN 160 MG/5 ML PO SOLN [100]: 650 mg | ORAL | @ 02:00:00 | NDC 00121197121

## 2022-12-27 MED ADMIN — ACETAMINOPHEN 160 MG/5 ML PO SOLN [100]: 650 mg | ORAL | @ 04:00:00 | NDC 00121197121

## 2022-12-27 MED ADMIN — METHOCARBAMOL 750 MG PO TAB [4972]: 750 mg | ORAL | @ 02:00:00 | NDC 70010077005

## 2022-12-27 MED ADMIN — ROSUVASTATIN 10 MG PO TAB [88503]: 20 mg | ORAL | @ 14:00:00 | Stop: 2022-12-28 | NDC 00904677961

## 2022-12-27 MED ADMIN — METHOCARBAMOL 750 MG PO TAB [4972]: 750 mg | ORAL | @ 14:00:00 | Stop: 2022-12-28 | NDC 70010077005

## 2022-12-27 MED ADMIN — ACETAMINOPHEN 160 MG/5 ML PO SOLN [100]: 650 mg | ORAL | @ 10:00:00 | Stop: 2022-12-28 | NDC 00121197121

## 2022-12-27 MED ADMIN — MAGNESIUM HYDROXIDE 400 MG/5 ML PO SUSP [79944]: 30 mL | NASOGASTRIC | @ 02:00:00 | NDC 00121043130

## 2022-12-27 MED ADMIN — SPIRONOLACTONE 25 MG PO TAB [7437]: 25 mg | ORAL | @ 14:00:00 | Stop: 2022-12-28 | NDC 00904692761

## 2022-12-27 MED ADMIN — SACUBITRIL-VALSARTAN 24-26 MG PO TAB [325930]: 1 | ORAL | @ 14:00:00 | Stop: 2022-12-27 | NDC 00078065920

## 2022-12-27 MED FILL — TORSEMIDE 20 MG PO TAB: 20 mg | ORAL | 1 days supply | Qty: 1 | Fill #1 | Status: CP

## 2022-12-27 MED FILL — SPIRONOLACTONE 25 MG PO TAB: 25 mg | ORAL | 30 days supply | Qty: 30 | Fill #1 | Status: CP

## 2022-12-27 MED FILL — METHOCARBAMOL 750 MG PO TAB: 750 mg | ORAL | 10 days supply | Qty: 20 | Fill #1 | Status: CP

## 2022-12-27 MED FILL — OXYCODONE 5 MG PO TAB: 5 mg | ORAL | 3 days supply | Qty: 12 | Fill #1 | Status: CP

## 2022-12-27 MED FILL — TORSEMIDE 20 MG PO TAB: 20 mg | ORAL | 29 days supply | Qty: 29 | Fill #2 | Status: CP

## 2022-12-28 ENCOUNTER — Encounter: Admit: 2022-12-28 | Discharge: 2022-12-28 | Payer: No Typology Code available for payment source

## 2022-12-29 ENCOUNTER — Encounter: Admit: 2022-12-29 | Discharge: 2022-12-29 | Payer: No Typology Code available for payment source

## 2022-12-29 ENCOUNTER — Ambulatory Visit: Admit: 2022-12-29 | Discharge: 2022-12-29 | Payer: No Typology Code available for payment source

## 2022-12-29 DIAGNOSIS — D179 Benign lipomatous neoplasm, unspecified: Secondary | ICD-10-CM

## 2022-12-29 DIAGNOSIS — G4733 Obstructive sleep apnea (adult) (pediatric): Secondary | ICD-10-CM

## 2022-12-29 DIAGNOSIS — E119 Type 2 diabetes mellitus without complications: Secondary | ICD-10-CM

## 2022-12-29 DIAGNOSIS — I8390 Asymptomatic varicose veins of unspecified lower extremity: Secondary | ICD-10-CM

## 2022-12-29 DIAGNOSIS — I10 Essential (primary) hypertension: Secondary | ICD-10-CM

## 2022-12-29 NOTE — Telephone Encounter
Hospital Discharge Follow Up      Reached Patient: Seen in clinic 24-48 hours post discharge Pulmonary office visit 12/29/2022  Patient Date of Birth: [birth year]     Admission Information:     Hospital Name: [facility]  Admission Date: 12/24/2022    Discharge Date: 12/27/2022    Admission Diagnosis:  Thyroid cancer; Papillary thyroid carcinoma  Discharge Diagnosis: (Principal) Papillary thyroid carcinoma   12/24/2022 Surgical procedure by Dr. Fernande Boyden, MD: Thyroid lobectomy total unilateral, left  Has there been a discharge within the last 30 days? No   Hospital Services: Planned  Today's call is 1 (business) days post discharge    Medication Reconciliation    Changes to pre-hospital medications? Yes  EMPIECE a tomar:  acetaminophen SR (TYLENOL ARTHRITIS PAIN)  ENTRESTO (sacubitriL-valsartan)  Esto sustituye un medicamento parecido. Vea la lista de  medicamentos para instrucciones.  methocarbamoL (ROBAXIN)  oxyCODONE (ROXICODONE)  spironolactone (ALDACTONE)  torsemide (DEMADEX)  DEJE de tomar:  ENTRESTO 24-26 mg tablet (sacubitriL-valsartan)  Reemplazado con un medicamento parecido.  Were new prescriptions filled? N/A  Meds reviewed and reconciled? Yes    Current Outpatient Medications   Medication Instructions    acetaminophen SR (TYLENOL ARTHRITIS PAIN) 650 mg, Oral, EVERY  8 HOURS PRN    cetirizine (ZYRTEC) 10 mg, Oral, DAILY  PRN    gabapentin (NEURONTIN) 300 mg, Oral, THREE TIMES DAILY    MAGNESIUM CITRATE PO 1 tablet, Oral, EVERY EVENING    metFORMIN (GLUCOPHAGE) 1,000 mg, Oral, TWICE DAILY AFTER MEALS    methocarbamoL (ROBAXIN) 750 mg tablet Take one tablet by mouth twice daily for 10 days.    oxyCODONE (ROXICODONE) 5 mg, Oral, EVERY  6 HOURS PRN    rosuvastatin (CRESTOR) 20 mg, Oral, DAILY    sacubitriL-valsartan (ENTRESTO) 49-51 mg tablet Take one tablet by mouth twice daily.    spironolactone (ALDACTONE) 25 mg tablet Take one tablet by mouth daily for 30 days. Take with food.    tirzepatide (MOUNJARO) 2.5 mg/0.5 mL injector PEN Inject 0.5 mL under the skin every 7 days.    torsemide (DEMADEX) 20 mg tablet Take one tablet by mouth daily for 30 days.           Scheduling Follow-up Appointment   Upcoming appointments:   Future Appointments   Date Time Provider Department Center   01/01/2023  3:00 PM Albakour, Hilda Lias, APRN-NP [facility]   01/02/2023 11:00 AM Sharon Mt, PA-C [facility]     Does the patient require 7 day follow up appointment? No  Hospital Follow-Up scheduled with PCP? No, none  When was patient?s last PCP visit: Visit date not found   PCP primary location: [facility]  Specialist appointment scheduled? Yes, with Pulmonary 12/29/2022    MyChart message sent? Active in MyChart. No message sent.   Artera text sent? No    Donnelly Stager, RN

## 2022-12-29 NOTE — Progress Notes
Pharmacy Benefits Investigation    Medication name: ENTRESTO 49-51 MG PO TAB    The insurance requires a prior authorization for the medication. The prior authorization was submitted via CoverMyMeds.    PA number: [id number]    Maree Erie  Specialty Pharmacy Patient Advocate

## 2022-12-29 NOTE — Patient Instructions
Change the mask every 3-6 months  Change the hose once a year  Change the filters once a month  Wash the mask, hose and humidifier every week with soap water or vinegar with water. Most insurances are requiring patients to meet 'CPAP compliance guidelines' which require 4 hours of use a night.  This will require you to use your CPAP at least 4 hours a night over a period of 30 days.  You have 90 days from the date you receive your machine to meet these guidelines.  If this is the case you will also be required to have a follow-up appt within this time frame as well. If you do not meet compliance, your insurance may discontinue payment for your CPAP.    Most insurance plans allow a 30 day window from date of setup to try different masks.    For questions or issues, call my nurse at [phone] If you need to schedule an appointment, you can call [phone]. A new CPAP or BiPAP machine has been ordered for you.  We will send the order to a medical supply company (aka DME or Durable Medical Equipment).  Once they received the order, they will run it through your insurance company for authorization.  They will contact you once this is completed, usually 7-10 days after the order is placed.  They will go over all financial information with you and schedule an appointment to get you set up with your equipment.      Please contact Maralyn Sago RN for any questions or concerns at [phone]

## 2022-12-29 NOTE — Telephone Encounter
Noted. Will f/u as needed.

## 2022-12-30 ENCOUNTER — Encounter: Admit: 2022-12-30 | Discharge: 2022-12-30 | Payer: No Typology Code available for payment source

## 2022-12-30 DIAGNOSIS — G4733 Obstructive sleep apnea (adult) (pediatric): Secondary | ICD-10-CM

## 2022-12-30 NOTE — Progress Notes
Pharmacy Benefits Investigation    Medication name: ENTRESTO 49-51 MG PO TAB    The prior authorization was approved for [employer] (PA number [id number]) through 12/29/2023.        A copay card was obtained and will provide the patient with an undefined amount of funding.    A trial card was obtained and will provide the patient with 30 days of medication.    After assistance, the final out of pocket cost is $10.    Maree Erie  Specialty Pharmacy Patient Advocate

## 2022-12-31 ENCOUNTER — Encounter: Admit: 2022-12-31 | Discharge: 2022-12-31 | Payer: No Typology Code available for payment source

## 2022-12-31 NOTE — Telephone Encounter
Patient sent MyChart Message: Mucho dolor en la pierna izquierda y coloraci?n oscura en la zona.    12/31/2022 11:37 AM  With the assistance of Spanish Interpreter [phone]), called and spoke with patient about his request to schedule appointment with Dr. Junita Push.  Per [employer] [phone]), patient stated that his he is having left leg pain with dark spots.  Appointment request has been sent to the nursing team for review and instruction.  Patient was informed of scheduling protocol and voiced understanding.  SMO

## 2023-01-01 ENCOUNTER — Encounter: Admit: 2023-01-01 | Discharge: 2023-01-01 | Payer: No Typology Code available for payment source

## 2023-01-01 NOTE — Telephone Encounter
Order for AutoPAP 8-15cm H2O placed as discussed per office note on 12/29/22    DME: provider plus  Will continue to follow up.

## 2023-01-02 ENCOUNTER — Encounter
Admit: 2023-01-02 | Discharge: 2023-01-02 | Payer: No Typology Code available for payment source | Primary: Student in an Organized Health Care Education/Training Program

## 2023-01-02 ENCOUNTER — Ambulatory Visit
Admit: 2023-01-02 | Discharge: 2023-01-02 | Payer: No Typology Code available for payment source | Primary: Student in an Organized Health Care Education/Training Program

## 2023-01-02 DIAGNOSIS — I8390 Asymptomatic varicose veins of unspecified lower extremity: Secondary | ICD-10-CM

## 2023-01-02 DIAGNOSIS — E119 Type 2 diabetes mellitus without complications: Secondary | ICD-10-CM

## 2023-01-02 DIAGNOSIS — I10 Essential (primary) hypertension: Secondary | ICD-10-CM

## 2023-01-02 DIAGNOSIS — C73 Malignant neoplasm of thyroid gland: Secondary | ICD-10-CM

## 2023-01-02 DIAGNOSIS — D179 Benign lipomatous neoplasm, unspecified: Secondary | ICD-10-CM

## 2023-01-06 ENCOUNTER — Encounter
Admit: 2023-01-06 | Discharge: 2023-01-06 | Payer: No Typology Code available for payment source | Primary: Student in an Organized Health Care Education/Training Program

## 2023-01-13 ENCOUNTER — Encounter
Admit: 2023-01-13 | Discharge: 2023-01-13 | Payer: No Typology Code available for payment source | Primary: Student in an Organized Health Care Education/Training Program

## 2023-01-13 NOTE — Telephone Encounter
PT is calling in regards to biopsy results. Please call PT back, thank you!

## 2023-02-10 ENCOUNTER — Encounter
Admit: 2023-02-10 | Discharge: 2023-02-10 | Payer: No Typology Code available for payment source | Primary: Student in an Organized Health Care Education/Training Program

## 2023-02-10 MED ORDER — ENTRESTO 49-51 MG PO TAB
1 | ORAL_TABLET | Freq: Two times a day (BID) | ORAL | 0 refills
Start: 2023-02-10 — End: ?

## 2023-02-13 ENCOUNTER — Encounter
Admit: 2023-02-13 | Discharge: 2023-02-13 | Payer: No Typology Code available for payment source | Primary: Student in an Organized Health Care Education/Training Program

## 2023-02-18 ENCOUNTER — Encounter
Admit: 2023-02-18 | Discharge: 2023-02-18 | Payer: Medicaid - Out of State | Primary: Student in an Organized Health Care Education/Training Program

## 2023-02-18 ENCOUNTER — Observation Stay
Admit: 2023-02-18 | Discharge: 2023-02-19 | Payer: Medicaid - Out of State | Primary: Student in an Organized Health Care Education/Training Program

## 2023-02-18 ENCOUNTER — Emergency Department: Admit: 2023-02-18 | Discharge: 2023-02-18 | Payer: Medicaid - Out of State

## 2023-02-19 ENCOUNTER — Encounter
Admit: 2023-02-19 | Discharge: 2023-02-19 | Payer: Medicaid - Out of State | Primary: Student in an Organized Health Care Education/Training Program

## 2023-02-19 MED FILL — FUROSEMIDE 40 MG PO TAB: 40 mg | ORAL | 30 days supply | Qty: 30 | Fill #1 | Status: CP

## 2023-02-19 MED FILL — METOPROLOL SUCCINATE 100 MG PO TB24: 100 mg | ORAL | 30 days supply | Qty: 30 | Fill #1 | Status: CP

## 2023-02-19 MED FILL — LOSARTAN 50 MG PO TAB: 50 mg | ORAL | 30 days supply | Qty: 30 | Fill #1 | Status: CP

## 2023-02-20 ENCOUNTER — Encounter
Admit: 2023-02-20 | Discharge: 2023-02-20 | Payer: Medicaid - Out of State | Primary: Student in an Organized Health Care Education/Training Program

## 2023-02-20 NOTE — Telephone Encounter
Noted. Will f/u as needed.

## 2023-02-20 NOTE — Telephone Encounter
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 No   Hospital Services: Unplanned  Today's call is 1 (business) days post discharge    Discharge Instruction Review   Did patient receive and understand discharge instructions? Yes    Home Health ordered? No  Caregiver assistance in the home? No   Are there concerns regarding the patient's ADL'S? No  Is patient a fall risk? No    Special diet? Yes, low sodium, low sugar, low carbohydrates  Discharge issues and concerns: Comments: None      Medication Reconciliation    Changes to pre-hospital medications? Yes  EMPIECE a tomar:  furosemide (LASIX)  losartan (COZAAR)  metoprolol succinate XL (TOPROL XL)  Empiece a tomar el: noviembre 1, 2024  DEJE de tomar:  ENTRESTO 49-51 mg tablet (sacubitriL-valsartan)  MAGNESIUM CITRATE PO  MOUNJARO 2.5 mg/0.5 mL injector PEN (tirzepatide)  Were new prescriptions filled? Yes  Meds reviewed and reconciled? Yes    Current Outpatient Medications Medication Instructions    acetaminophen SR (TYLENOL ARTHRITIS PAIN) 650 mg, Oral, EVERY  8 HOURS PRN    cetirizine (ZYRTEC) 10 mg, Oral, DAILY  PRN    furosemide (LASIX) 40 mg tablet Take one tablet by mouth every morning.    gabapentin (NEURONTIN) 300 mg, Oral, THREE TIMES DAILY    losartan (COZAAR) 50 mg tablet Take one tablet by mouth daily.    metFORMIN (GLUCOPHAGE) 1,000 mg, Oral, TWICE DAILY AFTER MEALS    metoprolol succinate XL (TOPROL XL) 100 mg extended release tablet Take one tablet by mouth daily.    oxyCODONE (ROXICODONE) 5 mg, Oral, EVERY  6 HOURS PRN    rosuvastatin (CRESTOR) 20 mg, Oral, DAILY       Understanding Condition   Having any current symptoms? Yes, Pain/discomfort left lower chest and side but much better than 02/19/2023 (rates pain 3 out of 10), little swelling legs/feet. Test blood sugars once every other day but hasn't tested on 02/20/2023. Takes blood pressure at home but hasn't tested on 02/20/2023. Denies chest pain, shortness of breath, headache, dizziness, fever, nausea, vomiting, cough, congestion, dysuria, burning of urination, diarrhea, constipation.     Do you have a history of Heart Failure? Yes, What time do you weigh yourself daily? Test occasionally but not every day  What was your weight today? Has not tested on 02/20/2023  Have you experienced weight gain since you were discharged from the hospital?  N/A  Have you had an increase in swelling since discharge?No    Have you had an increase in shortness of air since discharge? No  Have you had an increase in fatigue since discharge? No  Have you had an increase in abdominal bloating/tightness since discharge? No  Do you have a zone sheet? No    Patient understands when to seek additional medical care? Yes   Other items discussed: Reviewed AVS with patient           Scheduling Follow-up Appointment   Upcoming appointments:   Future Appointments   Date Time Provider Department Center   02/24/2023  9:00 AM Pearson, Shela Commons, APRN-NP [facility] C[facility] Exam   04/20/2023  9:50 AM [facility] ROOM 2 [id number] Surgery   04/28/2023  9:45 AM Arnspiger, Fanny Bien, MD [facility] Surgery   06/29/2023  9:15 AM Bertram Millard [facility] Radiology   07/03/2023 11:00 AM Bur, Verdie Mosher, MD [facility]     Does the patient require 7 day follow up appointment? Yes   Hospital Follow-Up scheduled with PCP? No, patient declined appointment states did not want to make any appointment with [facility]  When was patient?s last PCP visit: 12/10/2021   PCP primary location: [facility]  Specialist appointment scheduled? Yes, with Cardiology 02/24/2023  Is assistance with transportation needed? No   MyChart message sent? Active in MyChart. No message sent.   Artera text sent? No    Donnelly Stager, RN

## 2023-04-20 ENCOUNTER — Encounter
Admit: 2023-04-20 | Discharge: 2023-04-20 | Payer: Medicaid - Out of State | Primary: Student in an Organized Health Care Education/Training Program

## 2023-04-28 ENCOUNTER — Encounter
Admit: 2023-04-28 | Discharge: 2023-04-28 | Payer: Medicaid - Out of State | Primary: Student in an Organized Health Care Education/Training Program

## 2023-06-25 ENCOUNTER — Encounter
Admit: 2023-06-25 | Discharge: 2023-06-25 | Payer: Medicaid - Out of State | Primary: Student in an Organized Health Care Education/Training Program

## 2023-06-29 ENCOUNTER — Encounter
Admit: 2023-06-29 | Discharge: 2023-06-29 | Payer: Medicaid - Out of State | Primary: Student in an Organized Health Care Education/Training Program

## 2023-07-20 ENCOUNTER — Encounter
Admit: 2023-07-20 | Discharge: 2023-07-20 | Payer: Medicaid - Out of State | Primary: Student in an Organized Health Care Education/Training Program

## 2023-09-10 ENCOUNTER — Encounter
Admit: 2023-09-10 | Discharge: 2023-09-10 | Payer: Medicaid - Out of State | Primary: Student in an Organized Health Care Education/Training Program

## 2023-10-10 ENCOUNTER — Encounter
Admit: 2023-10-10 | Discharge: 2023-10-10 | Payer: Medicaid - Out of State | Primary: Student in an Organized Health Care Education/Training Program

## 2023-11-14 ENCOUNTER — Encounter
Admit: 2023-11-14 | Discharge: 2023-11-14 | Payer: Medicaid - Out of State | Primary: Student in an Organized Health Care Education/Training Program

## 2023-11-14 MED FILL — FUROSEMIDE 40 MG PO TAB: 40 mg | ORAL | 30 days supply | Qty: 30 | Fill #1 | Status: CP

## 2023-11-16 ENCOUNTER — Encounter
Admit: 2023-11-16 | Discharge: 2023-11-16 | Payer: Medicaid - Out of State | Primary: Student in an Organized Health Care Education/Training Program

## 2023-11-16 MED FILL — LOSARTAN 50 MG PO TAB: 50 mg | ORAL | 30 days supply | Qty: 30 | Fill #0 | Status: AC

## 2023-11-26 ENCOUNTER — Encounter: Admit: 2023-11-26 | Discharge: 2023-11-26 | Payer: Medicaid - Out of State

## 2023-12-23 ENCOUNTER — Encounter: Admit: 2023-12-23 | Discharge: 2023-12-23 | Payer: Medicaid - Out of State

## 2024-01-10 ENCOUNTER — Encounter
Admit: 2024-01-10 | Discharge: 2024-01-10 | Payer: PRIVATE HEALTH INSURANCE | Primary: Student in an Organized Health Care Education/Training Program

## 2024-01-10 ENCOUNTER — Emergency Department: Admit: 2024-01-10 | Discharge: 2024-01-10 | Payer: PRIVATE HEALTH INSURANCE

## 2024-01-10 DIAGNOSIS — E1142 Type 2 diabetes mellitus with diabetic polyneuropathy: Secondary | ICD-10-CM

## 2024-01-10 DIAGNOSIS — D72829 Elevated white blood cell count, unspecified: Secondary | ICD-10-CM

## 2024-01-10 DIAGNOSIS — K920 Hematemesis: Principal | ICD-10-CM

## 2024-01-10 DIAGNOSIS — K289 Gastrojejunal ulcer, unspecified as acute or chronic, without hemorrhage or perforation: Secondary | ICD-10-CM

## 2024-01-10 DIAGNOSIS — R000 Tachycardia, unspecified: Secondary | ICD-10-CM

## 2024-01-10 DIAGNOSIS — K921 Melena: Secondary | ICD-10-CM

## 2024-01-10 DIAGNOSIS — R0682 Tachypnea, not elsewhere classified: Secondary | ICD-10-CM

## 2024-01-10 DIAGNOSIS — K922 Gastrointestinal hemorrhage, unspecified: Secondary | ICD-10-CM

## 2024-01-10 DIAGNOSIS — R55 Syncope and collapse: Secondary | ICD-10-CM

## 2024-01-10 MED ORDER — PANTOPRAZOLE 40 MG IV SOLR
80 mg | Freq: Once | INTRAVENOUS | 0 refills | Status: CP
Start: 2024-01-10 — End: ?
  Administered 2024-01-11: 01:00:00 80 mg via INTRAVENOUS

## 2024-01-10 MED ORDER — SODIUM CHLORIDE 0.9 % IJ SOLN
50 mL | Freq: Once | INTRAVENOUS | 0 refills | Status: CP
Start: 2024-01-10 — End: ?
  Administered 2024-01-11: 01:00:00 50 mL via INTRAVENOUS

## 2024-01-10 MED ORDER — ACETAMINOPHEN 325 MG PO TAB
650 mg | ORAL | 0 refills | Status: DC | PRN
Start: 2024-01-10 — End: 2024-01-19
  Administered 2024-01-11 – 2024-01-17 (×5): 650 mg via ORAL

## 2024-01-10 MED ORDER — FENTANYL CITRATE (PF) 50 MCG/ML IJ SOLN
75 ug | Freq: Once | INTRAVENOUS | 0 refills | Status: CP
Start: 2024-01-10 — End: ?
  Administered 2024-01-11: 01:00:00 75 ug via INTRAVENOUS

## 2024-01-10 MED ORDER — ONDANSETRON HCL (PF) 4 MG/2 ML IJ SOLN
4 mg | Freq: Once | INTRAVENOUS | 0 refills | Status: CP
Start: 2024-01-10 — End: ?
  Administered 2024-01-11: 01:00:00 4 mg via INTRAVENOUS

## 2024-01-10 MED ORDER — FAMOTIDINE (PF) 20 MG/2 ML IV SOLN
20 mg | Freq: Once | INTRAVENOUS | 0 refills | Status: CP
Start: 2024-01-10 — End: ?
  Administered 2024-01-11: 01:00:00 20 mg via INTRAVENOUS

## 2024-01-10 MED ORDER — IOHEXOL 350 MG IODINE/ML IV SOLN
100 mL | Freq: Once | INTRAVENOUS | 0 refills | Status: CP
Start: 2024-01-10 — End: ?
  Administered 2024-01-11: 01:00:00 100 mL via INTRAVENOUS

## 2024-01-10 MED ORDER — LIDOCAINE 5 % TP PTMD
1 | Freq: Every day | TOPICAL | 0 refills | Status: DC
Start: 2024-01-10 — End: 2024-01-19
  Administered 2024-01-11 – 2024-01-19 (×7): 1 via TOPICAL

## 2024-01-10 NOTE — ED Notes
 Pt presents to the ED for GI bleed. He was at home when he began having severe abdominal pain followed by vomiting blood. When EMS arrived they observed patient to have vomited approx 100 cc of blood. When the patient arrived to the ED he immediately had to have a bowel movement. Pt states he's been having abdominal pain for the past few days and he was unable to have a bowel movement so he took laxatives yesterday and today.  Pt denies shortness of breath, chest pain, fevers.  Pt endorses nausea, vomiting, diarrhea and chill.  Pt is spanish speaking only. This RN used a hospital interpreter via phone to ask patient medical information.

## 2024-01-10 NOTE — Care Plan
 I was paged about this patient who came to the ED with hematemesis. Pt has history of gastric bypass in 03/2023 Apparently had few episodes of hematemesis in the ED. Currently hemodynamically stable.  Hb 10.7. baseline was 15 about a year ago.   - Waiting for the CTA results.   - IV pantoprazole  80 mg once and then 40 mg IV BID.  - Please notify the GI fellow if the repeat hemoglobin shows a significant drop or if the patient develops hemodynamic instability.

## 2024-01-10 NOTE — H&P (View-Only)
 Internal Medicine History and Physical      Patient's Name:  Danny Kidd MRN: [id number]   Today's Date:  01/10/2024  Admission Date: 01/10/2024  LOS: 0 days    Problem list  Principal Problem:    Hematemesis      History of Present Illness:     Danny Kidd is a 43 y.o. Spanish-speaking man with a pertinent PMH of Roux-en-Y gastric bypass, heart failure with preserved ejection fraction, hypertension, papillary thyroid carcinoma, and type 2 diabetes.  Patient developed sudden onset of bright red blood in his vomit with clots at 3 PM this afternoon.  He was witnessed by EMS with a significantly 100 cc of bright red blood in his vomit.  While in the ER, patient had a large, bloody bowel movement and lost consciousness.  Prior to this presentation, he has had approximately 4 days of epigastric pain described as cramping in nature.  He describes feeling like his bowels are inflammed and is having an urge to move his bowels during examination.  He denies recent fevers, chills, sick contacts, and recent travel.     Social: Patient has a 20-pack-year history.  He states he has not drink alcohol since December 2025.  He only drinks socially.  Denies any history of recreational drug use.  4 years ago when patient immigrated to the [place] .    Pertinent ER/OSH workup:   - CBC: WBC 13.9 Hb 9.7 Plt 255  - CMP: Na 141 K 4.0 Cl 108 CO2 24 BUN 30 Cr 1.13 AST 17 ALT 12 Lipase 19 ALP 37   - CXR 9/21/2: Stable mild cardiomegaly without abnormality  -CT angiogram abdomen/pelvis 01/10/2024: Prior bowel [place] gastric bypass with fat stranding between the gastrojejunostomy and anterior aspect of the pancreatic body jejunum adjacent to gastrojejunostomy anastomotic site.  Formation lies containing bowel perforation.  There is no intraperitoneal fluid collection, significant ascites, or pneumoperitoneum.     ER/OSH interventions: 2 U PRBC ordered, GI consulted       Assessment and Plan:     Upper GI bleed Acute blood loss anemia   History of Roux-en-Y Gastric Bypass (03/2023)   - Source of upper GI bleed secondary to ulcer versus localized bowel perforation.  -CT angiogram abdomen/pelvis 01/10/2024: Prior bowel [place] gastric bypass with fat stranding between the gastrojejunostomy and anterior aspect of the pancreatic body jejunum adjacent to gastrojejunostomy anastomotic site.  Formation lies containing bowel perforation.  There is no intraperitoneal fluid collection, significant ascites, or pneumoperitoneum.   - Hb 10.7 on admission --> 9.7 following bloody bowel movement   - BUN elevated at 30   - Lactic acid within normal limits at 1.1   - Soft, nondistended, nontender to palpation without evidence of  peritonitis on exam  PLAN:  > GI consulted  > EGS consulted  > Patient n.p.o. at midnight  > DVT prophylaxis held  > CBC every 6 hours  > 2 units packed red blood cells ordered  > If patient clinically deteriorates, will repeat CTA and consult IR    HFpEF, not in acute exacerbation  Hypertension  - CXR 9/21/2: Stable mild cardiomegaly without abnormality  -TTE 12/26/2022: Moderately dilated left ventricle ejection fraction of 50%.  Indeterminate left ventricular diastolic dysfunction.  40 mmHg.  CVP 10 to 20 mmHg.  - PTA diuresis: Lasix  40 mg daily  - PTA GDMT: Losartan  100 mg daily metoprolol  succinate 100 mg daily  PLAN:  > Hold PTA diuresis and GDMT in  setting of active bleeding    Type II Diabetes   - A1C: 8.5 02/19/2024  PTA: Metformin  1000 mg BID  PLAN:  > Hold PTA Metformin  in inpatient setting     Leukocytosis  - Mild elevated WBC to 13.9, possibly reactive vs. Early infection in setting of possible bowel perforation   PLAN:  > Blood cultures ordered      Nutrition: No Dietician Consult  Wound: No Wound Consult    Diet: NPO at midnight   VTE ppx: Contraindication:  Active bleeding; Gastrointestinal bleeding   CODE: Full Code    DISPO: Admit to medicine.      Patient was discussed with Dr. Missie Edsel Southward, DO  Internal Medicine PGY-1  Available on Voalte      Subjective:     Review of Systems:   Negative unless stated in HPI     Past medical history:  The patient  has a past medical history of DM (diabetes mellitus) (CMS-HCC), Hypertension, Lipoma, and Varicose veins of lower extremity.    Social History     Tobacco Use    Smoking status: Some Days     Current packs/day: 0.00     Types: Cigarettes     Last attempt to quit: 12/19/2021     Years since quitting: 2.0     Passive exposure: Current    Smokeless tobacco: Never    Tobacco comments:     2-3 cigarettes on Sundays with family     Previously smoked for 8 years half pack a day (~4 pack years)   Vaping Use    Vaping status: Never Used   Substance Use Topics    Alcohol use: Not Currently     Comment: About a drink a week    Drug use: Never       Past surgical history:  The patient  has a past surgical history that includes tumor excision (Left, 08/19/2021); endovenous ablation of the great saphenous vein (Right, 06/20/2021); microphlebectomy (Right, 06/20/2021); Varicose vein surgery (Right, 04/03/2022); and thyroidectomy (Left, 12/24/2022).    Family History:  family history is not on file.    Objective:   Vital Signs:  BP 124/83  - Pulse 64  - Temp 36.6 ?C (97.8 ?F)  - Wt 80.8 kg (178 lb 2.1 oz)  - SpO2 93%  - BMI 28.75 kg/m?     Physical exam:   General: No acute distress. Awake and conversant.   Eyes: Normal conjunctiva, anicteric. Round symmetric pupils.   ENT: Hearing grossly intact. No nasal discharge.   Neck: Neck is supple. No masses or thyromegaly.   Respiratory: Respirations are non-labored. No wheezing.   Skin: Warm. No rashes or ulcers.   Psych: Alert and oriented. Cooperative, Appropriate mood and affect, Normal judgment.   CV: RRR. No murmurs. No lower extremity edema.   GI: Abdomen soft, flat, nontender to palpation. No HSM. Bowel sounds heard throughout.   MSK: Normal ambulation. No clubbing or cyanosis.   Neuro: Sensation grossly normal. No focal deficit.       Labs:  Recent Labs     09 /21/25  1903 01/10/24  1905 01/10/24  2024   HGB 10.7*  --  9.7*   HCT 31.1*  --  27.7*   WBC 13.90*  --   --    PLTCT 255  --   --    NA 141  --   --    K 4.0  --   --  CL 108  --   --    CO2 24  --   --    BUN 30*  --   --    CR 1.13 1.2  --    GLU 138*  --   --    CA 8.9  --   --    ALBUMIN 3.8  --   --    TOTPROT 5.9*  --   --    TOTBILI 0.7  --   --    AST 17  --   --    ALT 12  --   --    ALKPHOS 37  --   --    LIPASE 19  --   --    INR 1.2  --   --    PTT 25.7  --   --    Glucose: (!) 138 (01/10/24 1903)    Pertinent Radiology Reviewed.     Meds:  Scheduled Meds:Continuous Infusions:  PRN and Respiratory Meds:

## 2024-01-11 ENCOUNTER — Encounter
Admit: 2024-01-11 | Discharge: 2024-01-11 | Payer: PRIVATE HEALTH INSURANCE | Primary: Student in an Organized Health Care Education/Training Program

## 2024-01-11 ENCOUNTER — Inpatient Hospital Stay
Admit: 2024-01-11 | Discharge: 2024-01-11 | Payer: PRIVATE HEALTH INSURANCE | Primary: Student in an Organized Health Care Education/Training Program

## 2024-01-11 LAB — CBC
~~LOC~~ BKR HEMATOCRIT: 28 % — ABNORMAL LOW (ref 40.0–50.0)
~~LOC~~ BKR HEMATOCRIT: 27 % — ABNORMAL LOW (ref 40.0–50.0)
~~LOC~~ BKR HEMATOCRIT: 26 % — ABNORMAL LOW (ref 40.0–50.0)
~~LOC~~ BKR HEMOGLOBIN: 10 g/dL — ABNORMAL LOW (ref 13.5–16.5)
~~LOC~~ BKR HEMOGLOBIN: 9.7 g/dL — ABNORMAL LOW (ref 13.5–16.5)
~~LOC~~ BKR HEMOGLOBIN: 9.2 g/dL — ABNORMAL LOW (ref 13.5–16.5)
~~LOC~~ BKR MCH: 30 pg (ref 26.0–34.0)
~~LOC~~ BKR MCH: 29 pg (ref 26.0–34.0)
~~LOC~~ BKR MCH: 29 pg — ABNORMAL LOW (ref 26.0–34.0)
~~LOC~~ BKR MCHC: 35 g/dL (ref 32.0–36.0)
~~LOC~~ BKR MCHC: 35 g/dL (ref 32.0–36.0)
~~LOC~~ BKR MCHC: 34 g/dL (ref 32.0–36.0)
~~LOC~~ BKR MCV: 84 fL (ref 80.0–100.0)
~~LOC~~ BKR MCV: 84 fL (ref 80.0–100.0)
~~LOC~~ BKR MCV: 84 fL (ref 80.0–100.0)
~~LOC~~ BKR MPV: 8.8 fL (ref 7.0–11.0)
~~LOC~~ BKR MPV: 9 fL (ref 7.0–11.0)
~~LOC~~ BKR MPV: 9.2 fL (ref 7.0–11.0)
~~LOC~~ BKR PLATELET COUNT: 172 10*3/uL (ref 150–400)
~~LOC~~ BKR PLATELET COUNT: 177 10*3/uL (ref 150–400)
~~LOC~~ BKR PLATELET COUNT: 177 10*3/uL (ref 150–400)
~~LOC~~ BKR RBC COUNT: 3.3 10*6/uL — ABNORMAL LOW (ref 4.40–5.50)
~~LOC~~ BKR RBC COUNT: 3.2 10*6/uL — ABNORMAL LOW (ref 4.40–5.50)
~~LOC~~ BKR RBC COUNT: 3.1 10*6/uL — ABNORMAL LOW (ref 4.40–5.50)
~~LOC~~ BKR RDW: 13 % (ref 11.0–15.0)
~~LOC~~ BKR RDW: 13 % (ref 11.0–15.0)
~~LOC~~ BKR RDW: 14 % (ref 11.0–15.0)
~~LOC~~ BKR WBC COUNT: 6.9 10*3/uL (ref 4.50–11.00)
~~LOC~~ BKR WBC COUNT: 6.1 10*3/uL (ref 4.50–11.00)
~~LOC~~ BKR WBC COUNT: 5.7 10*3/uL (ref 4.50–11.00)

## 2024-01-11 LAB — CBC AND DIFF
~~LOC~~ BKR HEMATOCRIT: 31 % — ABNORMAL LOW (ref 40.0–50.0)
~~LOC~~ BKR HEMOGLOBIN: 10 g/dL — ABNORMAL LOW (ref 13.5–16.5)
~~LOC~~ BKR MCH: 29 pg (ref 26.0–34.0)
~~LOC~~ BKR MCHC: 34 g/dL — ABNORMAL HIGH (ref 32.0–36.0)
~~LOC~~ BKR MCV: 84 fL (ref 80.0–100.0)
~~LOC~~ BKR MPV: 8.9 fL — ABNORMAL HIGH (ref 7.0–11.0)
~~LOC~~ BKR NEUTROPHILS %: 74 % — ABNORMAL HIGH (ref 41.0–77.0)
~~LOC~~ BKR PLATELET COUNT: 255 10*3/uL (ref 150–400)
~~LOC~~ BKR RBC COUNT: 3.6 10*6/uL — ABNORMAL LOW (ref 4.40–5.50)
~~LOC~~ BKR RDW: 13 % — ABNORMAL LOW (ref 11.0–15.0)
~~LOC~~ BKR WBC COUNT: 13 10*3/uL — ABNORMAL HIGH (ref 4.50–11.00)

## 2024-01-11 LAB — POC BLOOD GAS VEN
~~LOC~~ BKR POC BASE EXCESS, VEN: 0 mmol/L — ABNORMAL LOW (ref 40–50)
~~LOC~~ BKR POC BICARB, VEN: 25 mmol/L
~~LOC~~ BKR POC CO2, VEN: 40 mmHg (ref 36–50)
~~LOC~~ BKR POC O2 SAT, VEN: 52 % — ABNORMAL LOW (ref 55–71)
~~LOC~~ BKR POC O2, VEN: 28 mmHg — ABNORMAL LOW (ref 33–48)
~~LOC~~ BKR POC PH, VEN: 7.4 (ref 7.30–7.40)

## 2024-01-11 LAB — COMPREHENSIVE METABOLIC PANEL
~~LOC~~ BKR ALBUMIN: 3.8 g/dL — ABNORMAL HIGH (ref 3.5–5.0)
~~LOC~~ BKR ALK PHOSPHATASE: 37 U/L (ref 25–110)
~~LOC~~ BKR ALT: 12 U/L (ref 7–56)
~~LOC~~ BKR ANION GAP: 9 (ref 3–12)
~~LOC~~ BKR AST: 17 U/L — ABNORMAL HIGH (ref 7–40)
~~LOC~~ BKR CALCIUM: 8.9 mg/dL — ABNORMAL LOW (ref 8.5–10.6)
~~LOC~~ BKR CO2: 24 mmol/L (ref 21–30)
~~LOC~~ BKR CREATININE: 1.1 mg/dL — ABNORMAL LOW (ref 0.40–1.24)
~~LOC~~ BKR GLOMERULAR FILTRATION RATE (GFR): >60 mL/min (ref >60)
~~LOC~~ BKR TOTAL PROTEIN: 5.9 g/dL — ABNORMAL LOW (ref 6.0–8.0)

## 2024-01-11 LAB — CONFIRMATION BLOOD TYPE

## 2024-01-11 LAB — MAGNESIUM: ~~LOC~~ BKR MAGNESIUM: 2 mg/dL (ref 1.6–2.6)

## 2024-01-11 LAB — POC CREATININE: ~~LOC~~ BKR POC CREATININE: 1.2 mg/dL (ref 0.4–1.24)

## 2024-01-11 LAB — POC LACTATE: ~~LOC~~ BKR POC LACTIC ACID: 1.1 mmol/L (ref 0.5–2.0)

## 2024-01-11 LAB — POC POTASSIUM: ~~LOC~~ BKR POC POTASSIUM: 3.6 mmol/L (ref 3.5–5.1)

## 2024-01-11 LAB — LIPASE: ~~LOC~~ BKR LIPASE: 19 U/L (ref 11–82)

## 2024-01-11 MED ORDER — OXYCODONE 5 MG PO TAB
5 mg | ORAL | 0 refills | Status: DC | PRN
Start: 2024-01-11 — End: 2024-01-17
  Administered 2024-01-12: 17:00:00 5 mg via ORAL

## 2024-01-11 MED ORDER — GABAPENTIN 300 MG PO CAP
300 mg | Freq: Three times a day (TID) | ORAL | 0 refills | Status: DC
Start: 2024-01-11 — End: 2024-01-19
  Administered 2024-01-11 – 2024-01-19 (×17): 300 mg via ORAL

## 2024-01-11 MED ORDER — PIPERACILLIN/TAZOBACTAM 4.5 G/100ML NS IVPB (MB+)
4.5 g | INTRAVENOUS | 0 refills | Status: DC
Start: 2024-01-11 — End: 2024-01-11
  Administered 2024-01-11 (×4): 4.5 g via INTRAVENOUS

## 2024-01-11 MED ORDER — DIATRIZOATE MEG-DIATRIZOAT SOD 66-10 % PO SOLN
15 mL | Freq: Once | ORAL | 0 refills | Status: CP
Start: 2024-01-11 — End: ?
  Administered 2024-01-11: 09:00:00 15 mL via ORAL

## 2024-01-11 MED ORDER — ONDANSETRON HCL (PF) 4 MG/2 ML IJ SOLN
4 mg | INTRAVENOUS | 0 refills | Status: DC | PRN
Start: 2024-01-11 — End: 2024-01-19

## 2024-01-11 MED ORDER — POLYETHYLENE GLYCOL 3350 17 GRAM PO PWPK
1 | Freq: Every day | ORAL | 0 refills | Status: DC | PRN
Start: 2024-01-11 — End: 2024-01-19

## 2024-01-11 MED ORDER — SENNOSIDES-DOCUSATE SODIUM 8.6-50 MG PO TAB
1 | Freq: Every day | ORAL | 0 refills | Status: DC | PRN
Start: 2024-01-11 — End: 2024-01-19

## 2024-01-11 MED ORDER — CETIRIZINE 10 MG PO TAB
10 mg | Freq: Every day | ORAL | 0 refills | Status: DC | PRN
Start: 2024-01-11 — End: 2024-01-19

## 2024-01-11 MED ORDER — ONDANSETRON 4 MG PO TBDI
4 mg | ORAL | 0 refills | Status: DC | PRN
Start: 2024-01-11 — End: 2024-01-19
  Administered 2024-01-13: 03:00:00 4 mg via ORAL

## 2024-01-11 MED ORDER — PANTOPRAZOLE 40 MG IV SOLR
40 mg | Freq: Two times a day (BID) | INTRAVENOUS | 0 refills | Status: DC
Start: 2024-01-11 — End: 2024-01-19
  Administered 2024-01-11 – 2024-01-19 (×16): 40 mg via INTRAVENOUS

## 2024-01-11 MED ORDER — ROSUVASTATIN 20 MG PO TAB
20 mg | Freq: Every day | ORAL | 0 refills | Status: DC
Start: 2024-01-11 — End: 2024-01-19
  Administered 2024-01-11 – 2024-01-19 (×6): 20 mg via ORAL

## 2024-01-11 MED ORDER — MELATONIN 5 MG PO TAB
5 mg | Freq: Every evening | ORAL | 0 refills | Status: DC | PRN
Start: 2024-01-11 — End: 2024-01-19
  Administered 2024-01-18: 02:00:00 5 mg via ORAL

## 2024-01-11 MED ORDER — NICOTINE 14 MG/24 HR TD PT24
1 | Freq: Every day | TRANSDERMAL | 0 refills | Status: DC
Start: 2024-01-11 — End: 2024-01-19
  Administered 2024-01-11 – 2024-01-14 (×3): 1 via TRANSDERMAL

## 2024-01-11 NOTE — Progress Notes
 Brief Surgery Serial Abdominal Exam    Subjective: Patient ambulating independently. Denies any nausea, vomiting. Continues to have bloody bowel movements.    Objective: Vital signs stable    Physical Exam  Gen: NAD, nontoxic  HEENT: NCAT, EOMI, anicteric sclerae  CV: Normal rate  Pulm: Non-labored respirations  Abd: Soft, non distended, n TTP, no R/G, not peritoneal  Neuro: GCS 15, MAE spontaneously  Psych: Normal affect    Assessment/Plan:  - No acute changes, will continue to monitor       Lauraine Corona, MD  Service Pager: [phone]

## 2024-01-11 NOTE — Case Management (ED)
 Case Management Admission Assessment    NAME:Danny Kidd                          MRN: [id number]             DOB:1981-03-01          AGE: 43 y.o.  ADMISSION DATE: 01/10/2024             DAYS ADMITTED: LOS: 1 day      Today?s Date: 01/11/2024    Due to language barrier, an interpreter was present during the history-taking and subsequent discussion (and for part of the physical exam) with this patient.    Interpreter mode: Telephonic  Interpreter/ ID Number: [id number] [id number]    Source of Information: Patient and EMR       Plan  Plan: Case Management Assessment, Assist PRN with SW/NCM Services  NCM reviewed EMR.  NCM attended and participated in MPR huddle.  NCM spoke with patient to complete initial assessment. NCM offered contact information and provided explanation of NCM role. NCM provided opportunity for questions and discussion. Patient/family encouraged to contact Case Management team with questions and concerns during hospitalization and until patient is able to transition back to the patient's primary care physician.  Demographics verified with patient.  Patient reports that he lives with his partner in a multilevel home in [place], [place]. Upon discharge, patient plans to return home and will coordinate transportation with his partner Danny Kidd.  Patient denies a history of the following: DME/HH/HI/SNF/IPR/LTACH.  Confirmed patient follows with Dr. Loralee Kidd for primary care - last seen over a year ago.  NCM will continue to follow for discharge planning/needs.    Patient Address/Phone  [address]  [place]  [place] [address]  [phone] (home)     Emergency Contact  Extended Emergency Contact Information  Primary Emergency Contact: Kidd,Danny  Home Phone: [phone]  Mobile Phone: [phone]  Relation: Significant Other    Healthcare Directive         Transportation  Does the Patient Need Case Management to Arrange Discharge Transport? (ex: facility, ambulance, wheelchair/stretcher, Medicaid, cab, other): No  Will the Patient Use Family Transport?: Yes  Transportation Name, Phone and Availability #1: [name] [phone] will provide transportation upon discharge.    Expected Discharge Date  01/13/2024     Living Situation Prior to Admission  Living Arrangements  Type of Residence: Home, independent  Living Arrangements: Spouse/significant other (Lives with his partner Danny Kidd.)  Financial risk analyst / Tub: Tub/Shower Unit  How many levels in the residence?: 3  Can patient live on one level if needed?: Yes  Does residence have entry and/or inside stairs?: No  Assistance needed prior to admit or anticipated on discharge: No  Level of Function   Prior level of function: Independent  Cognitive Abilities   Cognitive Abilities: Alert and Oriented, Engages in problem solving and planning, Understands nature of health condition, Participates in decision making, Recognizes impact of health condition on lifestyle    Financial Resources  Coverage  Primary Insurance: Nurse, learning disability  Secondary Insurance: No insurance  Additional Coverage: None  Medication Coverage    Medication Coverage: Charity fundraiser of Income   Source Of Income: Unemployed  Financial Assistance Needed?  Denies financial assistance needs at this time.    Psychosocial Needs  Mental Health  Mental Health History: No  Substance Use History  Substance Use History Screen: In the past  Comment: Previous tobacco use.  Other  None.    Current/Previous Services  PCP  Danny Kidd, [phone], [phone]  Pharmacy    Danny Kidd - [place]  [place], [place] - [address]  [address]  [place] [address]  Phone: [phone] Fax: [phone]    [facility] [phone] [facility] [place], [place] - [address]  [address]    [place] [address]  Phone: [phone] Fax: [phone]    Durable Medical Equipment   Durable Medical Equipment at home: None  Home Health  Receiving home health: No  Hemodialysis or Peritoneal Dialysis  Undergoing hemodialysis or peritoneal dialysis: No  Tube/Enteral Feeds  Receive tube/enteral feeds: No  Infusion  Receive infusions: No  Private Duty  Private duty help used: No  Home and Community Based Services  Home and community based services: No  Ryan White  Ryan White: N/A  Hospice  Hospice: No  Outpatient Therapy  PT: No  OT: No  SLP: No  Skilled Nursing Facility/Nursing Home  SNF: No  NH: No  Inpatient Rehab  IPR: No  Long-Term Acute Care Hospital  LTACH: No  Acute Hospital Stay  Acute Hospital Stay: In the past  Was patient's stay within the last 30 days?: No    Larraine Blumenthal, BSN, Chartered loss adjuster R Nurse Case Manager  [phone] and available on Voalte

## 2024-01-11 NOTE — Care Coordination-Inpatient
 Med Teaching TBD [phone] will take calls on this patient until 8:00 AM. Afterwards, please contact first on call for the designated service listed on chart.         AOD

## 2024-01-11 NOTE — Progress Notes
 Internal Medicine Daily Progress Note       Name:  Danny Kidd   1980/06/13  01/11/2024  Admission Date: 01/10/2024  LOS: 1 day                          Assessment and Plan     43 y.o. Spanish-speaking man with a pertinent PMH of Roux-en-Y gastric bypass, heart failure with preserved ejection fraction, hypertension, papillary thyroid carcinoma, and type 2 diabetes. Presented with hematemesis.       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  -CT angiogram abdomen/pelvis 01/10/2024: Prior bowel NY gastric bypass with fat stranding between the gastrojejunostomy and anterior aspect of the pancreatic body jejunum adjacent to gastrojejunostomy anastomotic site.  Formation lies containing bowel perforation.  There is no intraperitoneal fluid collection, significant ascites, or pneumoperitoneum.   - Hb 10.7 on admission --> 9.7 following bloody bowel movement   - BUN elevated at 30   - Lactic acid within normal limits at 1.1   - CT abd/pelvis 01/11/24 po contrast: 9//22/25  1. Prior Roux-en-Y gastric bypass, with similar ill-defined stranding   between the stomach and the pancreas.   2. No extraluminal contrast extravasation of the administration oral   contrast to suggest perforation. No pneumoperitoneum or ascites.    - Soft, nondistended, nontender to palpation without evidence of  peritonitis on exam  PLAN:  > GI consulted - plan for egd tomorrow.   > EGS consulted  > Patient n.p.o. at midnight  > DVT prophylaxis held  > CBC every 12 hours  > 1 unit PRBC  > If patient clinically deteriorates, will repeat CTA and consult IR     HFpEF, not in acute exacerbation  Hypertension  - CXR 9/21/2: Stable mild cardiomegaly without abnormality  -TTE 12/26/2022: Moderately dilated left ventricle ejection fraction of 50%.  Indeterminate left ventricular diastolic dysfunction.  40 mmHg.  CVP 10 to 20 mmHg.  - PTA diuresis: Lasix  40 mg daily  - PTA GDMT: Losartan  100 mg daily metoprolol  succinate 100 mg daily  PLAN:  > Hold PTA diuresis and GDMT in setting of active bleeding     Type II Diabetes   - A1C: 8.5 02/19/2024  PTA: Metformin  1000 mg BID  PLAN:  > Hold PTA Metformin  in inpatient setting      Leukocytosis  - Mild elevated WBC to 13.9, possibly reactive vs. Early infection in setting of possible bowel perforation   PLAN:  > Blood cultures ordered             FEN:   Clears-->npo    Prophylaxis Review:  Lines:  p iv  Urinary Catheter:  nond  VTE: scd.     Code status: full code.     Disposition: continue inpatient care.     E and m visit performed with help of interpreter ID [id number]        Balinda Maxon M.D., FACP  Pager [phone]  ________________________________________________________________________    Subjective    Danny Kidd is a 43 y.o. male.  Patient seen and examined. Had 2 episodes of hematochezia overnight. One episode of hematemesis. Patient denies chest pain, dyspnea, fever or chills.  Denies nausea, vomiting or diarrhea.          Objective  Vital Signs: Last Filed                 Vital Signs: 24 Hour Range   BP: 117/67 (09/22 0700)  Temp: 36.7 ?C (98.1 ?F) (09/22 0700)  Pulse: 57 (09/22 0700)  Respirations: 17 PER MINUTE (09/22 0700)  SpO2: 98 % (09/22 0700)  O2 Device: None (Room air) (09/21 1837)  Height: 168 cm (5' 6.14) (09/22 0130) BP: (103-150)/(63-92)   Temp:  [36.3 ?C (97.4 ?F)-36.9 ?C (98.4 ?F)]   Pulse:  [54-109]   Respirations:  [10 PER MINUTE-28 PER MINUTE]   SpO2:  [92 %-100 %]   O2 Device: None (Room air)         Intake/Output Summary:  (Last 24 hours)    Intake/Output Summary (Last 24 hours) at 01/11/2024 0951  Last data filed at 01/11/2024 0800  Gross per 24 hour   Intake 300 ml   Output --   Net 300 ml       Physical Exam  Physical Exam  Constitutional:       General: He is not in acute distress.     Appearance: He is ill-appearing. Cardiovascular:      Rate and Rhythm: Normal rate and regular rhythm.   Pulmonary:      Effort: No respiratory distress.      Breath sounds: No wheezing.   Abdominal:      General: There is no distension.      Palpations: There is no mass.      Tenderness: There is no abdominal tenderness.   Musculoskeletal:         General: No swelling.   Skin:     Coloration: Skin is not jaundiced.   Neurological:      Mental Status: He is alert and oriented to person, place, and time.           Medications  Scheduled IV PRN   gabapentin  (NEURONTIN ) capsule 300 mg, 300 mg, Oral, TID  lidocaine  (LIDODERM ) 5 % topical patch 1 patch, 1 patch, Topical, QDAY  nicotine  (NICODERM CQ ) 14 mg/day patch 1 patch, 1 patch, Transdermal, QDAY  pantoprazole  (PROTONIX ) injection 40 mg, 40 mg, Intravenous, API(88-78)  piperacillin /tazobactam (ZOSYN ) 4.5 g in sodium chloride  0.9% (NS) 100 mL IVPB (MB+), 4.5 g, Intravenous, Q6H*  rosuvastatin  (CRESTOR ) tablet 20 mg, 20 mg, Oral, QDAY  SODIUM CHLORIDE  0.9% IV SOLP (Cabinet Override), , , NOW      acetaminophen  Q4H PRN, cetirizine QDAY PRN, melatonin QHS PRN, ondansetron  Q6H PRN **OR** ondansetron  Q6H PRN, oxyCODONE  Q6H PRN, polyethylene glycol 3350 QDAY PRN, sennosides-docusate sodium QDAY PRN       Lab Review  24-hour labs:    Results for orders placed or performed during the hospital encounter of 01/10/24 (from the past 24 hours)   POC BLOOD GAS VEN    Collection Time: 01/10/24  7:01 PM   Result Value Ref Range    PH-VEN-POC 7.40 7.30 - 7.40    PCO2-VEN-POC 40 36 - 50 mm[Hg]    PO2-VEN-POC 28 (L) 33 - 48 mm[Hg]    Base Ex-VEN-POC 0 mmol/L    O2 Sat-VEN-POC 52 (L) 55 - 71 %    Bicarbonate-VEN- POC 25 mmol/L   POC HEMATOCRIT&HEMOGLOBIN    Collection Time: 01/10/24  7:01 PM   Result Value Ref Range    Hemoglobin POC 10.2 (L) 13.5 - 16.5 g/dL    Hematocrit POC 30 (L) 40 - 50 %   POC SODIUM  Collection Time: 01/10/24  7:01 PM   Result Value Ref Range    SODIUM-POC 141 137 - 147 mmol/L   POC POTASSIUM Collection Time: 01/10/24  7:01 PM   Result Value Ref Range    Potassium-POC 3.6 3.5 - 5.1 mmol/L   POC LACTATE    Collection Time: 01/10/24  7:02 PM   Result Value Ref Range    LACTIC ACID POC 1.1 0.5 - 2.0 mmol/L   CBC AND DIFF    Collection Time: 01/10/24  7:03 PM   Result Value Ref Range    White Blood Cells 13.90 (H) 4.50 - 11.00 10*3/uL    Red Blood Cells 3.66 (L) 4.40 - 5.50 10*6/uL    Hemoglobin 10.7 (L) 13.5 - 16.5 g/dL    Hematocrit 68.8 (L) 40.0 - 50.0 %    MCV 84.9 80.0 - 100.0 fL    MCH 29.2 26.0 - 34.0 pg    MCHC 34.4 32.0 - 36.0 g/dL    RDW 86.1 88.9 - 84.9 %    Platelet Count 255 150 - 400 10*3/uL    MPV 8.9 7.0 - 11.0 fL    Neutrophils 74.9 41.0 - 77.0 %    Lymphocytes 17.5 (L) 24.0 - 44.0 %    Monocytes 6.3 4.0 - 12.0 %    Eosinophils 1.0 0.0 - 5.0 %    Basophils 0.3 0.0 - 2.0 %    Absolute Neutrophil Count 10.40 (H) 1.80 - 7.00 10*3/uL    Absolute Lymph Count 2.40 1.00 - 4.80 10*3/uL    Absolute Monocyte Count 0.90 (H) 0.00 - 0.80 10*3/uL    Absolute Eosinophil Count 0.10 0.00 - 0.45 10*3/uL    Absolute Basophil Count 0.00 0.00 - 0.20 10*3/uL    MDW (Monocyte Distribution Width) 17.9 <=20.6   COMPREHENSIVE METABOLIC PANEL    Collection Time: 01/10/24  7:03 PM   Result Value Ref Range    Sodium 141 137 - 147 mmol/L    Potassium 4.0 3.5 - 5.1 mmol/L    Chloride 108 98 - 110 mmol/L    Glucose 138 (H) 70 - 100 mg/dL    Blood Urea Nitrogen 30 (H) 7 - 25 mg/dL    Creatinine 8.86 9.59 - 1.24 mg/dL    Calcium 8.9 8.5 - 89.3 mg/dL    Total Protein 5.9 (L) 6.0 - 8.0 g/dL    Total Bilirubin 0.7 0.2 - 1.3 mg/dL    Albumin 3.8 3.5 - 5.0 g/dL    Alk Phosphatase 37 25 - 110 U/L    AST 17 7 - 40 U/L    ALT 12 7 - 56 U/L    CO2 24 21 - 30 mmol/L    Anion Gap 9 3 - 12    Glomerular Filtration Rate (GFR) >60 >60 mL/min   PROTIME INR (PT)    Collection Time: 01/10/24  7:03 PM   Result Value Ref Range    Protime 14.1 9.9 - 14.2 Seconds    INR 1.2 0.9 - 1.2   PTT (APTT)    Collection Time: 01/10/24  7:03 PM   Result Value Ref Range    APTT 25.7 24.0 - 36.5 Seconds   LIPASE    Collection Time: 01/10/24  7:03 PM   Result Value Ref Range    Lipase 19 11 - 82 U/L   TYPE & CROSSMATCH    Collection Time: 01/10/24  7:03 PM   Result Value Ref Range    ABO/RH(D) A POS  Antibody Screen NEG     Crossmatch Expires 01/13/2024,2359     Units Ordered 1     Record Check NOT FOUND    PREPARE RBC - SUNQUEST    Collection Time: 01/10/24  7:03 PM   Result Value Ref Range    Units Ordered 1     Crossmatch Expires 01/13/2024,2359     Record Check NOT FOUND     ABO/RH(D) A POS     Antibody Screen NEG     Unit Number [id number]     Blood Component Type RBC,ADSOL,LEUKO REDUCED     Unit Division 00     Status OF Unit TRANSFUSED     ISSUE DATE TIME [id number]     PRODUCT CODE [id number]     BLOOD TYPE A POS     CODING STATUS 6200     BLOOD EXPIRATION DATE [id number]     Transfusion Status OK TO TRANSFUSE     Crossmatch Result COMPATIBLE,ELECTRONIC    POC CREATININE    Collection Time: 01/10/24  7:05 PM   Result Value Ref Range    Creatinine, POC 1.2 0.4 - 1.24 mg/dL   CONFIRMATION BLOOD TYPE    Collection Time: 01/10/24  7:37 PM   Result Value Ref Range    ABO/RH(D) A POS    HEMOGLOBIN & HEMATOCRIT    Collection Time: 01/10/24  8:24 PM   Result Value Ref Range    Hemoglobin 9.7 (L) 13.5 - 16.5 g/dL    Hematocrit 72.2 (L) 40.0 - 50.0 %   CBC    Collection Time: 01/11/24 12:15 AM   Result Value Ref Range    White Blood Cells 6.90 4.50 - 11.00 10*3/uL    Red Blood Cells 3.37 (L) 4.40 - 5.50 10*6/uL    Hemoglobin 10.2 (L) 13.5 - 16.5 g/dL    Hematocrit 71.4 (L) 40.0 - 50.0 %    MCV 84.6 80.0 - 100.0 fL    MCH 30.2 26.0 - 34.0 pg    MCHC 35.7 32.0 - 36.0 g/dL    RDW 86.3 88.9 - 84.9 %    Platelet Count 172 150 - 400 10*3/uL    MPV 8.8 7.0 - 11.0 fL   CBC    Collection Time: 01/11/24  4:18 AM   Result Value Ref Range    White Blood Cells 6.10 4.50 - 11.00 10*3/uL    Red Blood Cells 3.27 (L) 4.40 - 5.50 10*6/uL    Hemoglobin 9.7 (L) 13.5 - 16.5 g/dL Hematocrit 72.2 (L) 59.9 - 50.0 %    MCV 84.6 80.0 - 100.0 fL    MCH 29.6 26.0 - 34.0 pg    MCHC 35.0 32.0 - 36.0 g/dL    RDW 86.2 88.9 - 84.9 %    Platelet Count 177 150 - 400 10*3/uL    MPV 9.0 7.0 - 11.0 fL   MAGNESIUM     Collection Time: 01/11/24  4:18 AM   Result Value Ref Range    Magnesium  2.0 1.6 - 2.6 mg/dL   CBC    Collection Time: 01/11/24  3:35 PM   Result Value Ref Range    White Blood Cells 5.70 4.50 - 11.00 10*3/uL    Red Blood Cells 3.13 (L) 4.40 - 5.50 10*6/uL    Hemoglobin 9.2 (L) 13.5 - 16.5 g/dL    Hematocrit 73.3 (L) 40.0 - 50.0 %    MCV 84.9 80.0 - 100.0 fL    MCH 29.4 26.0 - 34.0 pg  MCHC 34.6 32.0 - 36.0 g/dL    RDW 85.9 88.9 - 84.9 %    Platelet Count 177 150 - 400 10*3/uL    MPV 9.2 7.0 - 11.0 fL            Radiology  (Last 24 hours)                 01/11/24 0408  CT ABD/PELV WO CONTRAST Final result    Impression:      1. Prior Roux-en-Y gastric bypass, with similar ill-defined stranding between the stomach and the pancreas.   2. No extraluminal contrast extravasation of the administration oral contrast to suggest perforation. No pneumoperitoneum or ascites.              Finalized by Fairy DOROTHA Hockey, MD on 01/11/2024 5:07 AM. Dictated by Fairy DOROTHA Hockey, MD on 01/11/2024 4:58 AM.       01/10/24 2049  CHEST SINGLE VIEW Final result    Impression:          Stable mild cardiomegaly without acute cardiopulmonary abnormality.            Finalized by Jayden Spencer, M.D. on 01/10/2024 9:17 PM. Dictated by Regan Mirza, M.D. on 01/10/2024 9:16 PM.       01/10/24 1935  CTA ABDOMEN/PELVIS Final result    Impression:          1.  Prior Roux-en-Y gastric bypass with focal fat stranding between the gastrojejunostomy and anterior aspect of the pancreatic body. There is focal low density wall edema versus a small wall defect at the proximal jejunum adjacent to the gastrojejunostomy anastomotic site. Findings are suspicious for ulcer formation and/or localized/contained bowel perforation, though there is no intraperitoneal fluid collection, significant ascites or pneumoperitoneum to increase likelihood for perforation. Further evaluation with upper endoscopy is recommended.       2.  No bowel obstruction.       3.  Probable small gallbladder polyp measuring 0.6 cm. Given size and stability, no further follow-up is required.       4.  Normal heart size with concentric left ventricular hypertrophy.       By my electronic signature, I attest that I have personally reviewed the images for this examination and formulated the interpretations and opinions expressed in this report            Finalized by Norleen Pane, M.D. on 01/10/2024 9:05 PM. Dictated by Sharie GORMAN Mink, MBBS on 01/10/2024 8:30 PM.                 Point of Care Testing  (Last 24 hours)  Glucose: (!) 138 (01/10/24 1903)

## 2024-01-11 NOTE — Consults
 Gastroenterology Consultation    History of Present Illness:  Danny Kidd is a 43 y.o. Spanish-speaking man with a pertinent PMH of Roux-en-Y gastric bypass, heart failure with preserved ejection fraction, hypertension, papillary thyroid carcinoma, and type 2 diabetes. Presented w/ hematemesis.    Reported epigastric pain that began about 4 days PTA and has somewhat improved. Notes radiation to the back, no aggrevating factors. No improvement w/ daily ibuprofen. Intermittent ibuprofen use prior to this - every 1-2 weeks.     States that he had four episodes of coffee ground emesis yesterday w/ some imrovement of his epigastric pain. This was associated w/ an episode of syncope. No further emesis. No hx of hematemesis, melena, hematochezia, GERD. Denies nausea.     No prior colonoscopy.    Last EGD following bypass surgery. Patient notes no major concerns.    No PPI use. 20 back yr hx. Rare alcohol use.    Hgb 9.7, plts 255, WBC 13.9. LFTs normal. BUN 30, Cr 1.13.    Baseline hgb 15.    -CT angiogram abdomen/pelvis 01/10/2024: Prior bowel NY gastric bypass with fat stranding between the gastrojejunostomy and anterior aspect of the pancreatic body jejunum adjacent to gastrojejunostomy anastomotic site.  Formation lies containing bowel perforation.  There is no intraperitoneal fluid collection, significant ascites, or pneumoperitoneum.     Hemoglobin   Date Value Ref Range Status   01/11/2024 9.7 (L) 13.5 - 16.5 g/dL Final   90/77/7974 89.7 (L) 13.5 - 16.5 g/dL Final   90/78/7974 9.7 (L) 13.5 - 16.5 g/dL Final   90/78/7974 89.2 (L) 13.5 - 16.5 g/dL Final     Blood Urea Nitrogen   Date Value Ref Range Status   01/10/2024 30 (H) 7 - 25 mg/dL Final   89/68/7975 18 7 - 25 MG/DL Final   89/69/7975 21 7 - 25 MG/DL Final   90/92/7975 20 7 - 25 MG/DL Final     Platelet Count   Date Value Ref Range Status   01/11/2024 177 150 - 400 10*3/uL Final   01/11/2024 172 150 - 400 10*3/uL Final   01/10/2024 255 150 - 400 10*3/uL Final      CT ABD/PELV WO CONTRAST  Narrative: CT ABDOMEN AND PELVIS    Clinical Indication:  Vomiting blood, abdominal pain    Technique: Multiple contiguous axial CT images were obtained through the abdomen and pelvis without IV contrast. Post processing coronal and sagittal reconstruction images were made from the axial images.     IV contrast: None.  Bowel contrast:  Yes    Comparison: CT abdomen and pelvis dated 01/10/2024.    FINDINGS:    Limited evaluation without the use of IV contrast which includes the viscera and vasculature.    Lower Thorax: Minimal linear scarring/atelectasis in the left lung base. Stable tiny left lower lobe pulmonary nodule, likely benign.    Liver and Biliary system: Liver is normal in size. Normal caliber gallbladder, with vicarious excretion of contrast.     Spleen: Unremarkable.    Adrenal Glands and Kidneys: Diffuse thickening of the bilateral adrenal glands. Excreted IV contrast within the renal collecting system. No hydronephrosis. Stable exophytic lesion off the left kidney, likely a cyst.    Pancreas and Retroperitoneum: Unopacified pancreas is grossly unremarkable. No retroperitoneal lymphadenopathy or fluid collection.    Aorta and Major Vessels: Normal caliber abdominal aorta, with mild bilateral iliac calcified plaque.     Bowel, Mesentery, and Peritoneal space: Mild colonic diverticulosis. Moderate amount  of well formed stool throughout the colon. Normal appendix. Prior Roux-en-Y gastric bypass. Similar ill-defined stranding between the stomach and pancreatic body. No extraluminal oral contrast extravasation. GI tract is normal caliber. No ascites or pneumoperitoneum.    Pelvis: Excreted IV contrast within the bladder lumen. Mild prostatomegaly. No pelvic lymphadenopathy.    Abdominal Wall and Osseous Structures: Mild, diffuse body wall edema. Tiny fat-containing umbilical hernia. Mild thoracolumbar spondylosis.  Impression: 1. Prior Roux-en-Y gastric bypass, with similar ill-defined stranding between the stomach and the pancreas.  2. No extraluminal contrast extravasation of the administration oral contrast to suggest perforation. No pneumoperitoneum or ascites.       Finalized by Fairy DOROTHA Hockey, MD on 01/11/2024 5:07 AM. Dictated by Fairy DOROTHA Hockey, MD on 01/11/2024 4:58 AM.       Pertinent History:     Current Outpatient Medications   Medication Instructions    acetaminophen  SR (TYLENOL  ARTHRITIS PAIN) 650 mg, Oral, EVERY  8 HOURS PRN    cetirizine (ZYRTEC) 10 mg, Oral, DAILY  PRN    furosemide  (LASIX ) 40 mg tablet Take one tablet by mouth every morning.    gabapentin  (NEURONTIN ) 300 mg, Oral, THREE TIMES DAILY    losartan  (COZAAR ) 50 mg tablet Take one tablet by mouth daily.    metFORMIN  (GLUCOPHAGE ) 1,000 mg, Oral, TWICE DAILY AFTER MEALS    metoprolol  succinate XL (TOPROL  XL) 100 mg extended release tablet Take one tablet by mouth daily.    oxyCODONE  (ROXICODONE ) 5 mg, Oral, EVERY  6 HOURS PRN    rosuvastatin  (CRESTOR ) 20 mg, Oral, DAILY        Assessment/Plan:  Hematemesis, resolved  Epigastric pain  Acute blood loss anemia  History of Roux-en-Y bypass      Impression: Concern for ulceration 2/2 surgical history vs PUD.    Recommendations:  -IV PPI BID  -NPO at [facility] for EGD 9/23 to evaluate source of upper GIB  -Transfuse to maintain hgb >7  -CBC q12 hrs, increase frequency pending clinical stability      For emergent needs/concerns after 5pm or weekends: page on-call GI fellow.     Carlin Saxon, MD  Gastroenterology, Hepatology, & Motility    Patient plan of care discussed w/ attending physician, Dr. Chipper.  -----------------------------  PMH:  Past Medical History:    DM (diabetes mellitus) (CMS-HCC)    Hypertension    Lipoma    Varicose veins of lower extremity       Current medications:  Medications Ordered Prior to Encounter[1]         PSH:  Surgical History:   Procedure Laterality Date    HX ENDOVENOUS ABLATION OF THE GREAT SAPHENOUS VEIN Right 06/20/2021    Dr. Leron JOY MICROPHLEBECTOMY Right 06/20/2021    RMP Dr. Leron    EXCISION OF LEFT ABDOMINAL WALL MASS Left 08/19/2021    Performed by Alice Laymon CROME, DO at [facility]    VARICOSE VEIN SURGERY Right 04/03/2022    percutaneous ablation of the right perforator veins- Dr. Leron    THYROID LOBECTOMY TOTAL UNILATERAL Left 12/24/2022    Performed by Leo Gustav HERO, MD at [facility] OR       SH:  Social History     Socioeconomic History    Marital status: Single   Tobacco Use    Smoking status: Some Days     Current packs/day: 0.00     Types: Cigarettes     Last attempt to quit: 12/19/2021  Years since quitting: 2.0     Passive exposure: Current    Smokeless tobacco: Never    Tobacco comments:     2-3 cigarettes on Sundays with family     Previously smoked for 8 years half pack a day (~4 pack years)   Vaping Use    Vaping status: Never Used   Substance and Sexual Activity    Alcohol use: Not Currently     Comment: About a drink a week    Drug use: Never       FH:  No family history on file.    Review of Systems   All 12 systems otherwise negative.       Vitals:    01/11/24 0330 01/11/24 0415 01/11/24 0416 01/11/24 0700   BP: 122/71  130/80 117/67   BP Source:    Arm, Left Upper   Pulse: 54  63 57   Temp:   36.6 ?C (97.9 ?F) 36.7 ?C (98.1 ?F)   SpO2: 99%  100% 98%   O2 Device:       Weight:  80.1 kg (176 lb 9.4 oz)     Height:            Physical Exam  Constitutional:       General: He is not in acute distress.  HENT:      Head: Normocephalic.   Eyes:      Conjunctiva/sclera: Conjunctivae normal.   Pulmonary:      Effort: Pulmonary effort is normal.   Abdominal:      General: There is no distension.      Palpations: Abdomen is soft.      Tenderness: There is no abdominal tenderness.   Skin:     General: Skin is warm and dry.   Neurological:      Mental Status: He is alert and oriented to person, place, and time.          Labs/Imaging:  Reviewed           [1]   No current facility-administered medications on file prior to encounter.     Current Outpatient Medications on File Prior to Encounter   Medication Sig Dispense Refill    acetaminophen  SR (TYLENOL  ARTHRITIS PAIN) 650 mg tablet Take one tablet by mouth every 8 hours as needed for Pain. Indications: pain      cetirizine (ZYRTEC) 10 mg tablet Take one tablet by mouth daily as needed for Allergy symptoms.      furosemide  (LASIX ) 40 mg tablet Take one tablet by mouth every morning. 90 tablet 0    gabapentin  (NEURONTIN ) 300 mg capsule Take one capsule by mouth three times daily. 90 capsule 1    losartan  (COZAAR ) 50 mg tablet Take one tablet by mouth daily. 30 tablet 0    metFORMIN  (GLUCOPHAGE ) 1,000 mg tablet TAKE ONE TABLET BY MOUTH TWICE DAILY AFTER MEALS. 180 tablet 3    metoprolol  succinate XL (TOPROL  XL) 100 mg extended release tablet Take one tablet by mouth daily. 90 tablet 0    oxyCODONE  (ROXICODONE ) 5 mg tablet Take one tablet by mouth every 6 hours as needed for Pain for up to 12 doses. 12 tablet 0    rosuvastatin  (CRESTOR ) 20 mg tablet Take one tablet by mouth daily. 90 tablet 1

## 2024-01-11 NOTE — Consults
 [facility] Consult Note     Patient: Danny Kidd, [id number]  Admission Date:  01/10/2024, LOS: 1 day  Admission Diagnosis: Hematemesis [K92.0]  Date of Service: January 11, 2024  Time seen: 0025  Consult Emergency General Surgery Physician  Consult performed by: Francetta Distel, MD  Consult ordered by: Missie Sloan, MD           ASSESSMENT:   Danny Kidd is a 43 y.o. 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   - NPO, strict   - Repeat CT scan with PO contrast to evaluate for extravasation of contrast   - IV zosyn  for ppx   - BID PPI   - Serial abdominal exams by surgical team   - Please page EGS [phone]) with any questions or concerns.    Seen and discussed with staff surgeon, Dr. Ransom.    Distel Francetta, MD  _____________________________________________________________________________    HPI: Danny Kidd is a 43 y.o. male who presented to the hospital with hematemesis and abdominal pain.  He states this has been going on for about the past few days.  The pain is mostly located up in the left upper quadrant and epigastrium.  Earlier today is when the hematemesis and bloody bowel movements started.  On evaluation in the ED they obtained a CT scan with concern for marginal ulcer versus contained perforation.  The patient states that he has never had anything like this happen in the past before.  Not currently having any nausea or vomiting.  Still having bowel function.  States that he does take ibuprofen for pain in his back sometimes but does not take it all the time.     Endorses cigarette use, occasional alcohol use.  No other drug use    Pertinent surgical history: bypass in December in 2024, abdominal wall lipoma removed on the left flank, thyroid lobectomy   Blood thinners / antiplatelets: None    Past Medical History:    DM (diabetes mellitus) (CMS-HCC)    Hypertension    Lipoma    Varicose veins of lower extremity     Surgical History:   Procedure Laterality Date    HX ENDOVENOUS ABLATION OF THE GREAT SAPHENOUS VEIN Right 06/20/2021    Dr. Leron JOY MICROPHLEBECTOMY Right 06/20/2021    RMP Dr. Leron    EXCISION OF LEFT ABDOMINAL WALL MASS Left 08/19/2021    Performed by Alice Laymon CROME, DO at [facility]    VARICOSE VEIN SURGERY Right 04/03/2022    percutaneous ablation of the right perforator veins- Dr. Leron    THYROID LOBECTOMY TOTAL UNILATERAL Left 12/24/2022    Performed by Leo Gustav HERO, MD at [facility] OR     No family history on file.  Social History     Tobacco Use    Smoking status: Some Days     Current packs/day: 0.00     Types: Cigarettes     Last attempt to quit: 12/19/2021     Years since quitting: 2.0     Passive exposure: Current    Smokeless tobacco: Never    Tobacco comments:     2-3 cigarettes on Sundays with family  Previously smoked for 8 years half pack a day (~4 pack years)   Substance Use Topics    Alcohol use: Not Currently     Comment: About a drink a week     Your Current Medications:         Instructions    acetaminophen  SR (TYLENOL  ARTHRITIS PAIN) 650 mg tablet Take one tablet by mouth every 8 hours as needed for Pain. Indications: pain    cetirizine (ZYRTEC) 10 mg tablet Take one tablet by mouth daily as needed for Allergy symptoms.    furosemide  (LASIX ) 40 mg tablet Take one tablet by mouth every morning.    gabapentin  (NEURONTIN ) 300 mg capsule Take one capsule by mouth three times daily.    losartan  (COZAAR ) 50 mg tablet Take one tablet by mouth daily.    metFORMIN  (GLUCOPHAGE ) 1,000 mg tablet TAKE ONE TABLET BY MOUTH TWICE DAILY AFTER MEALS.    metoprolol  succinate XL (TOPROL  XL) 100 mg extended release tablet Take one tablet by mouth daily.    oxyCODONE  (ROXICODONE ) 5 mg tablet Take one tablet by mouth every 6 hours as needed for Pain for up to 12 doses.    rosuvastatin  (CRESTOR ) 20 mg tablet Take one tablet by mouth daily.            ROS:  A 14 point comprehensive review of systems is negative except as noted in the HPI.    Vitals:  BP: (103-150)/(63-92)   Temp:  [36.3 ?C (97.4 ?F)-36.9 ?C (98.4 ?F)]   Pulse:  [59-109]   Respirations:  [10 PER MINUTE-28 PER MINUTE]   SpO2:  [92 %-100 %]   O2 Device: None (Room air)  Body mass index is 28.63 kg/m?[name]    Physical Exam  Gen: NAD, nontoxic  HEENT: NCAT, EOMI, anicteric sclerae  CV: Normal rate  Pulm: Non-labored respirations  Abd: Soft, non distended, moderate tenderness to palpation, More so in the Left  upper quadrant and epigastrium.  No rebound tenderness, not peritoneal  Neuro: GCS 15, MAE spontaneously  Psych: Normal affect    Lab/Radiology/Other Diagnostic Tests:  Lab Results   Component Value Date/Time    NA 141 01/10/2024 07:03 PM    K 4.0 01/10/2024 07:03 PM    CL 108 01/10/2024 07:03 PM    CO2 24 01/10/2024 07:03 PM    BUN 30 (H) 01/10/2024 07:03 PM    CR 1.2 01/10/2024 07:05 PM    MG 1.8 02/19/2023 07:47 AM    PO4 3.2 01/02/2022 12:14 AM          Lab Results   Component Value Date/Time    HGB 10.2 (L) 01/11/2024 12:15 AM    HCT 28.5 (L) 01/11/2024 12:15 AM    WBC 6.90 01/11/2024 12:15 AM    PLTCT 172 01/11/2024 12:15 AM    INR 1.2 01/10/2024 07:03 PM     Lab Results   Component Value Date/Time    GLUPOC 186 (H) 02/19/2023 08:07 AM    GLUPOC 253 (H) 02/18/2023 09:18 PM    GLUPOC 170 (H) 02/18/2023 04:36 PM          CHEST SINGLE VIEW   Final Result         Stable mild cardiomegaly without acute cardiopulmonary abnormality.          Finalized by Jayden Spencer, M.D. on 01/10/2024 9:17 PM. Dictated by Regan Mirza, M.D. on 01/10/2024 9:16 PM.      CTA ABDOMEN/PELVIS   Final Result  1.  Prior Roux-en-Y gastric bypass with focal fat stranding between the gastrojejunostomy and anterior aspect of the pancreatic body. There is focal low density wall edema versus a small wall defect at the proximal jejunum adjacent to the gastrojejunostomy anastomotic site. Findings are suspicious for ulcer formation and/or localized/contained bowel perforation, though there is no intraperitoneal fluid collection, significant ascites or pneumoperitoneum to increase likelihood for perforation. Further evaluation with upper endoscopy is recommended.      2.  No bowel obstruction.      3.  Probable small gallbladder polyp measuring 0.6 cm. Given size and stability, no further follow-up is required.      4.  Normal heart size with concentric left ventricular hypertrophy.      By my electronic signature, I attest that I have personally reviewed the images for this examination and formulated the interpretations and opinions expressed in this report          Finalized by Norleen Pane, M.D. on 01/10/2024 9:05 PM. Dictated by Sharie GORMAN Mink, MBBS on 01/10/2024 8:30 PM.      CT ABD/PELV WO CONTRAST    (Results Pending)       Malnutrition Details:                                                   Active Wounds

## 2024-01-12 ENCOUNTER — Encounter
Admit: 2024-01-12 | Discharge: 2024-01-12 | Payer: PRIVATE HEALTH INSURANCE | Primary: Student in an Organized Health Care Education/Training Program

## 2024-01-12 ENCOUNTER — Inpatient Hospital Stay
Admit: 2024-01-12 | Discharge: 2024-01-12 | Payer: PRIVATE HEALTH INSURANCE | Primary: Student in an Organized Health Care Education/Training Program

## 2024-01-12 LAB — EGD REPORT

## 2024-01-12 LAB — POC GLUCOSE
~~LOC~~ BKR POC GLUCOSE: 72 mg/dL (ref 70–100)
~~LOC~~ BKR POC GLUCOSE: 72 mg/dL (ref 70–100)

## 2024-01-12 MED ORDER — LACTATED RINGERS IV SOLP
INTRAVENOUS | 0 refills | Status: DC
Start: 2024-01-12 — End: 2024-01-12
  Administered 2024-01-12: 15:00:00 1000.0000 mL via INTRAVENOUS

## 2024-01-12 MED ORDER — LACTATED RINGERS IV BOLUS
500 mL | Freq: Once | INTRAVENOUS | 0 refills | Status: DC
Start: 2024-01-12 — End: 2024-01-13

## 2024-01-12 MED ORDER — PROCHLORPERAZINE EDISYLATE 5 MG/ML IJ SOLN
10 mg | INTRAVENOUS | 0 refills | Status: DC | PRN
Start: 2024-01-12 — End: 2024-01-19
  Administered 2024-01-15: 04:00:00 10 mg via INTRAVENOUS

## 2024-01-12 MED ORDER — LACTATED RINGERS IV BOLUS
1000 mL | Freq: Once | INTRAVENOUS | 0 refills | Status: CP
Start: 2024-01-12 — End: ?

## 2024-01-12 MED ADMIN — PROPOFOL INJ 10 MG/ML IV VIAL [210331]: 150 ug/kg/min | INTRAVENOUS | @ 15:00:00 | Stop: 2024-01-12 | NDC 25021060820

## 2024-01-12 NOTE — Progress Notes
 General Progress Note    Name: Danny Kidd        MRN: [id number]          DOB: 1980/09/18            Age: 43 y.o.  Admission Date: 01/10/2024       LOS: 2 days    Date of Service: 01/12/2024    Assessment/Plan:      43 y.o. 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  -CT angiogram abdomen/pelvis 01/10/2024: Prior bowel [place] gastric bypass with fat stranding between the gastrojejunostomy and anterior aspect of the pancreatic body jejunum adjacent to gastrojejunostomy anastomotic site.  There is no intraperitoneal fluid collection, significant ascites, or pneumoperitoneum.   -CT w oral contrast abdomen/pelvis: Prior Roux-en-Y gastric bypass, with similar ill-defined stranding   between the stomach and the pancreas. No extraluminal contrast extravasation of the administration oral   contrast to suggest perforation. No pneumoperitoneum or ascites.     - CXR: stable mild cardiomegaly  - Hgb stable today 9.2, 10.7 on admission --> 9.7 following bloody bowel movement  - Received one unit PRBCs 9/21   - BUN elevated at 30   - Lactic acid within normal limits at 1.1   - Soft, nondistended, nontender to palpation without evidence of  peritonitis on exam  - GI consulted  - EGD 9/23: One superficial esophageal ulcer with no bleeding was found 41cm from the incisors. The lesion was 3mm in largest dimesnion. 1ch hiatal hernia was present. 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 Biopsies were taken via cold forcepts and sent to pathology.  - GI recs: resume diet, omeprazole BID 8wks, conside sucralate slurry for 2 wks, avoid NSAIDs, repeat EGD 8-12 weeks.  - Surgery consulted   - BID IV protonix  40mg   - regular diet  - DVT prophylaxis held  - CBC every 6 hours     HFpEF, not in acute exacerbation  Hypertension  Tobacco Use   - CXR 9/21/2: Stable mild cardiomegaly without abnormality  -TTE 12/26/2022: Moderately dilated left ventricle ejection fraction of 50%.  Indeterminate left ventricular diastolic dysfunction.  40 mmHg.  CVP 10 to 20 mmHg.  - PTA diuresis: Lasix  40 mg daily  - PTA GDMT: Losartan  100 mg daily metoprolol  succinate 100 mg daily  - Will hold home meds in setting of acute bleed   - nicotine  patch prn     DM2  - A1C: 8.5 02/19/2024  - Patient not taking metformin  as per endocrinologist a few months ago, pt was taking metformin  1000mg  BID prior  - 110lb wt loss since roux en Y 03/2023  - Repeat A1C outpatient     Leukocytosis - resolved  - Mild elevated WBC on admission to 13.9, most likely reactive  - WBC 4.7 today   - Blood cultures ordered, preliminary result no growth at 24 hours        Diet: regular  VTE ppx: none in setting of acute bleed  CODE: Full  Code     DISPO: D/C tomorrow if hgb stable and no active bleed.    Case discussed with Dr. Evia      Present on Admission:  **None**                               ATTESTATION    I personally performed or re-performed the history, physical exam and treatment for the E/M. I discussed the case with the Medical Student, and concur with the Medical Student documentation of history, physical exam and treatment plan unless otherwise noted.     Patient seen and examined.  Reports doing okay overall.  Hemoglobin stable.  Labs reviewed.  Exam stable.  EGD result discussed with the patient.  Exam is stable.  He did have 1 episode of bloody bowel movement earlier today.  Overnight yesterday he had some bloody bowel movement. He has pictures on his phone which appears to show hematochezia.  Mixed with melena.    Monitor overnight.  Advance diet as tolerated.      Staff name:  Balinda Evia, MD Date: 01/12/2024       _______________________________________________________________________    Subjective   Danny Kidd is a 43 y.o. male.  Patient gone back for EGD at this time. According to nursing patient had 3 episodes of hematochezia yesterday and overnight. He did not endorse any lightheadedness but did describe numbness in his fingertips that was not painful and began overnight. He additionally stated that he would like a prescription for nicotine  patches to use outpatient for nicotine  cessation. He would like to go home soon as he is concerned about taking too much time off of work.       Medications  Scheduled Meds:gabapentin  (NEURONTIN ) capsule 300 mg, 300 mg, Oral, TID  lidocaine  (LIDODERM ) 5 % topical patch 1 patch, 1 patch, Topical, QDAY  nicotine  (NICODERM CQ ) 14 mg/day patch 1 patch, 1 patch, Transdermal, QDAY  pantoprazole  (PROTONIX ) injection 40 mg, 40 mg, Intravenous, API(88-78)  rosuvastatin  (CRESTOR ) tablet 20 mg, 20 mg, Oral, QDAY    Continuous Infusions:  PRN and Respiratory Meds:acetaminophen  Q4H PRN, cetirizine QDAY PRN, melatonin QHS PRN, ondansetron  Q6H PRN **OR** ondansetron  Q6H PRN, oxyCODONE  Q6H PRN, polyethylene glycol 3350 QDAY PRN, sennosides-docusate sodium QDAY PRN         Objective                        Vital Signs: Last Filed                 Vital Signs: 24 Hour Range   BP: 131/76 (09/23 0700)  Temp: 36.7 ?C (98.1 ?F) (09/23 0700)  Pulse: 63 (09/23 0700)  Respirations: 17 PER MINUTE (09/23 0700)  SpO2: 100 % (09/23 0700)  O2 Device: None (Room air) (09/23 0700) BP: (131-144)/(76-82)   Temp:  [36.7 ?C (98.1 ?F)-36.9 ?C (98.4 ?F)]   Pulse:  [63-70]   Respirations:  [16 PER MINUTE-18 PER MINUTE]   SpO2:  [100 %]   O2 Device: None (Room air)     Vitals:    01/10/24 2010 01/11/24 0415   Weight: 80.8 kg (178 lb 2.1 oz) 80.1 kg (176 lb 9.4 oz)         Intake/Output Summary:  (Last 24 hours)    Intake/Output Summary (Last 24 hours) at 01/12/2024 9187  Last data filed at 01/11/2024 1700  Gross per 24 hour  Intake 960 ml   Output --   Net 960 ml     Stool Occurrence: 1        Physical Exam    General: No acute distress.    Eyes: Anicteric  ENT: Hearing grossly intact. No nasal discharge.   Neck: Neck is supple. No masses or thyromegaly.   Respiratory: Respirations are non-labored. No wheezing.   Psych: Alert and oriented. Cooperative, Appropriate mood and affect, Normal judgment.   CV: RRR. No murmurs. No lower extremity edema.   GI: Abdomen soft, flat, nontender to palpation.   MSK: Normal ambulation. No clubbing or cyanosis.   Neuro: Sensation grossly normal. No focal deficit.        Lab Review  24-hour labs:    Results for orders placed or performed during the hospital encounter of 01/10/24 (from the past 24 hours)   CBC    Collection Time: 01/11/24  3:35 PM   Result Value Ref Range    White Blood Cells 5.70 4.50 - 11.00 10*3/uL    Red Blood Cells 3.13 (L) 4.40 - 5.50 10*6/uL    Hemoglobin 9.2 (L) 13.5 - 16.5 g/dL    Hematocrit 73.3 (L) 40.0 - 50.0 %    MCV 84.9 80.0 - 100.0 fL    MCH 29.4 26.0 - 34.0 pg    MCHC 34.6 32.0 - 36.0 g/dL    RDW 85.9 88.9 - 84.9 %    Platelet Count 177 150 - 400 10*3/uL    MPV 9.2 7.0 - 11.0 fL   HEMOGLOBIN & HEMATOCRIT    Collection Time: 01/11/24  8:56 PM   Result Value Ref Range    Hemoglobin 9.2 (L) 13.5 - 16.5 g/dL    Hematocrit 74.1 (L) 40.0 - 50.0 %   CBC AND DIFF    Collection Time: 01/12/24  3:45 AM   Result Value Ref Range    White Blood Cells 4.70 4.50 - 11.00 10*3/uL    Red Blood Cells 3.03 (L) 4.40 - 5.50 10*6/uL    Hemoglobin 9.1 (L) 13.5 - 16.5 g/dL    Hematocrit 74.3 (L) 40.0 - 50.0 %    MCV 84.7 80.0 - 100.0 fL    MCH 30.2 26.0 - 34.0 pg    MCHC 35.6 32.0 - 36.0 g/dL    RDW 86.1 88.9 - 84.9 %    Platelet Count 170 150 - 400 10*3/uL    MPV 8.7 7.0 - 11.0 fL Neutrophils 59.6 41.0 - 77.0 %    Lymphocytes 31.5 24.0 - 44.0 %    Monocytes 5.8 4.0 - 12.0 %    Eosinophils 2.7 0.0 - 5.0 %    Basophils 0.4 0.0 - 2.0 %    Absolute Neutrophil Count 2.80 1.80 - 7.00 10*3/uL    Absolute Lymph Count 1.50 1.00 - 4.80 10*3/uL    Absolute Monocyte Count 0.30 0.00 - 0.80 10*3/uL    Absolute Eosinophil Count 0.10 0.00 - 0.45 10*3/uL    Absolute Basophil Count 0.00 0.00 - 0.20 10*3/uL    MDW (Monocyte Distribution Width) 16.7 <=20.6   COMPREHENSIVE METABOLIC PANEL    Collection Time: 01/12/24  3:45 AM   Result Value Ref Range    Sodium 140 137 - 147 mmol/L    Potassium 3.9 3.5 - 5.1 mmol/L    Chloride 109 98 - 110 mmol/L    Glucose 72 70 - 100 mg/dL    Blood Urea Nitrogen 18 7 - 25 mg/dL    Creatinine 9.28  0.40 - 1.24 mg/dL    Calcium 8.3 (L) 8.5 - 10.6 mg/dL    Total Protein 5.2 (L) 6.0 - 8.0 g/dL    Total Bilirubin 0.7 0.2 - 1.3 mg/dL    Albumin 3.4 (L) 3.5 - 5.0 g/dL    Alk Phosphatase 30 25 - 110 U/L    AST 10 7 - 40 U/L    ALT 7 7 - 56 U/L    CO2 24 21 - 30 mmol/L    Anion Gap 7 3 - 12    Glomerular Filtration Rate (GFR) >60 >60 mL/min   POC GLUCOSE    Collection Time: 01/12/24 10:03 AM   Result Value Ref Range    Glucose, POC 72 70 - 100 mg/dL   POC GLUCOSE    Collection Time: 01/12/24 10:51 AM   Result Value Ref Range    Glucose, POC 72 70 - 100 mg/dL       Point of Care Testing  (Last 24 hours)  Glucose: 72 (01/12/24 0345)    Radiology and other Diagnostics Review:    Pertinent radiology reviewed.     Bernardino Percy   Pager

## 2024-01-12 NOTE — Progress Notes
 Float Pool END OF SHIFT/ JHFRAT NOTE    Admission Date: 01/10/2024    Acute events, interventions, provider communication:     Rapid called at 2100, pt had a syncope episode. See rapid response notes from [facility] ICU team.    Pt Hgb currently 6.3, HCP informed, repeat Hand H, 2 units PRBC, CT of the abdomen have been ordered.        Patient Interventions and Education  Fall Risk/JHFRAT Interventions and Education: (Charting when applicable)  Elimination Interventions : Bed pan available in room  Medications : Use of gait belt , Stay within arm's reach during toileting/showering (i.e., dizziness, orthostasis) , and Educate patient on medication side effects  Patient Care Equipment: Ensure environment is free of clutter and walkways are clear from tripping hazards  Mobility: Assist x1 and Gait belt in use when ambulating  Cognition: N/A  Risk for Moderate/Major Injury: Surgery/procedure requiring anesthesia within past 24 hours    2. Restraints:  No     Restraint Goal: Patient will be free from injury while physically restrained.  See Docflowsheet for restraint documentation, interventions, education, etc.    Intake and Output:       Intake/Output Summary (Last 24 hours) at 01/13/2024 0645  Last data filed at 01/12/2024 1026  Gross per 24 hour   Intake 200 ml   Output --   Net 200 ml              Last Bowel Movement Date: 01/12/24

## 2024-01-12 NOTE — Progress Notes
 Rapid Response Event Note          Patient's name:  Danny Kidd  Medical Record Number: [id number]  Patient's account/billing number: [id number]  Patient's Date of Birth: 1981-01-28  Age: 43 y.o.  Date of Admission: 01/10/2024  6:35 PM  Date of History and Physical Examination: 01/12/2024  Primary Care Physician: Luevenia Riser A    Code Status: Full Code    Calling RRT     RRT was suggested by:     RRT was called due to :     ? Low BP & syncope - mild hematemesis     Assessment and Plan      Assessment:    - Syncope  - Hypotension  - Dehydration  - C/f acute anemia   Plan   - Sent CBC, CMP, lactic acid  - 1 L of LR bolus  - If BP does not respond -> will attempt another CTA abdomen to r/o new bleeding    This note  was created using  [employer], hence  some grammatical errors may still be present despite  editing at the time of the dictation, )    PE     Vitals:    01/12/24 2245   BP:    Pulse:    Temp:    SpO2:    O2 Device: None (Room air)   O2 Liter Flow:          Vital Signs: Last Filed In 24 Hours Vital Signs: 24 Hour Range   BP: 81/51 (09/23 2220)  Temp: 36.8 ?C (98.2 ?F) (09/23 2034)  Pulse: 61 (09/23 2223)  Respirations: 23 PER MINUTE (09/23 2223)  SpO2: 99 % (09/23 2223)  O2 Device: None (Room air) (09/23 2245)  O2 Liter Flow: 2 Lpm (09/23 2228) BP: (81-154)/(51-90)   Temp:  [36.7 ?C (98 ?F)-36.8 ?C (98.2 ?F)]   Pulse:  [60-93]   Respirations:  [0 PER MINUTE-29 PER MINUTE]   SpO2:  [83 %-100 %]   O2 Device: None (Room air)  O2 Liter Flow: 2 Lpm   Intensity Pain Scale (Self Report): 2 (01/12/24 1805)      Physical Exam  Vitals and nursing note reviewed.   Constitutional:       Appearance: He is obese.   HENT:      Head: Normocephalic and atraumatic.      Nose: Nose normal.      Mouth/Throat:      Mouth: Mucous membranes are moist.   Cardiovascular:      Rate and Rhythm: Normal rate.      Pulses: Normal pulses.   Pulmonary:      Effort: Pulmonary effort is normal.      Breath sounds: Normal breath sounds.   Abdominal:      General: Abdomen is flat. Bowel sounds are normal.      Palpations: Abdomen is soft.   Musculoskeletal:         General: Normal range of motion.   Skin:     Capillary Refill: Capillary refill takes less than 2 seconds.      Coloration: Skin is pale.   Neurological:      General: No focal deficit present.      Mental Status: He is alert and oriented to person, place, and time. Mental status is at baseline.   Psychiatric:         Mood and Affect: Mood normal.  Current Medication      Scheduled Meds:gabapentin  (NEURONTIN ) capsule 300 mg, 300 mg, Oral, TID  lidocaine  (LIDODERM ) 5 % topical patch 1 patch, 1 patch, Topical, QDAY  nicotine  (NICODERM CQ ) 14 mg/day patch 1 patch, 1 patch, Transdermal, QDAY  pantoprazole  (PROTONIX ) injection 40 mg, 40 mg, Intravenous, API(88-78)  rosuvastatin  (CRESTOR ) tablet 20 mg, 20 mg, Oral, QDAY      Continuous Infusions:  PRN Meds:acetaminophen  Q4H PRN, cetirizine QDAY PRN, melatonin QHS PRN, ondansetron  Q6H PRN **OR** ondansetron  Q6H PRN, oxyCODONE  Q6H PRN, polyethylene glycol 3350 QDAY PRN, sennosides-docusate sodium QDAY PRN    Labs     Lab Review  Recent labs reviewed  Labs ordered: CBC, CMP, lactic acid     Imaging   Recent imaging reviewed  Imaging ordered: None     EKG:  NA     direct visualization and independent view of previous images   DIET REGULAR   Results, diagnoses, prognoses were reviewed with the patient   Benefits / risks were explained in details. All questions answered.   discussed with RN, RRT    I spent a total of at least 40 CC minutes involved in this patient's care. I was involved at the bedside for a rapid response and was providing critical care services with imminent threat of decompensation of multiple organ system functions. Discussed with the patient regarding plan of care.     Electronically signed by Buren Daniels Waldon), M.D. on 01/12/2024 at 10:56 PM

## 2024-01-12 NOTE — Progress Notes
 EGD/Upper EUS/ERCP/Antegrade Enteroscopy                                Post Upper Endoscopy Instructions        -You may have a sore throat after the procedure for 2-3 days.  Try sucrets or lozenges to help ease the pain.  If it continues please contact us .    -If you feel feverish, have a temperature of 101 degrees or higher, persistent nausea and vomiting, abdominal pain or dark stools; please notify your nurse or GI physician.    -You may have abdominal cramping following the procedure this can be relieved by belching or passing air.    -If you have redness or swelling at the IV site, place a warm, wet washcloth over the affected areas for 15 minutes, 3-4 times a day until the redness subsides.  If symptoms continue for 2-3 days, contact your regular physician.    - If you have bleeding from your mouth, over 2 tablespoons and increasing, please notify your physician.  A small amount of bleeding is normal if a biopsy or polyps were taken.  If you are vomiting blood you need to seek immediate medical attention.    - You may resume all your routine medications, if medications need to be held your physician and/or nurse will notify you post procedure.    -If biopsies were taken your provider will call you with the results in 3-7 days.Please note: As of April 5th, 2021, the Cures Act requires that all healthcare providers give patients immediate access to their health information. This information includes EHR (electronic health records), consultation notes, discharge summaries, images, lab reports, pathology reports, procedure notes, progress notes, and paper charts. This means you may receive test results in your MyChart before your provider has reviewed them with you. Your care team will follow up with you via MyChart or telephone after reviewing the tests to interpret your results for you.    SPECIFIC INSTRUCTIONS  INPATIENTS:  Ask for help when you get up in your room, as you may still be drowsy from your sedation.    OUTPATIENTS:  Because of sedation and lack of coordination, UNTIL TOMORROW, DO NOT:  Operate any motorized vehicle - this includes driving.  Sign any legal documents or conduct important business matters.  Use any dangerous machinery (chain saw, lawnmower, etc.).  Drink any alcoholic beverages.    -Should you have any questions or concerns after your procedure please call [phone] M-F 8am-5:00 pm. After 5:00 pm, holidays or weekends call [phone] and ask for the GI Doctor on call.

## 2024-01-12 NOTE — Progress Notes
 Brief GI Note    EGD completed 9/23 revealing an esophageal ulcer w/ no bleeding nor stigmata of recent bleeding. Evidence of a Roux-en-Y gastrojejunostomy was found. The gastrojejunal anastomosis was characterized by ulceration on the jejunal side that was very difficult to visualize, could only partially see the ulceration and caught glimpses of a small clot. No active bleeding. Due to difficulty in visualization it was decided to not dislodge the clot. The  anastamosis was traversed, few staples were seen on the gastric side of the anastamosis. The pouch-to-jejunum limb was characterized by healthy appearing mucosa and was 6 cm in length.     Recommendations as below:          - Await pathology results.                                 - Resume previous diet.                                 - Continue present medications.                                 - Recommend omeprazole 40mg  twice a day for 8                                 weeks, best if taken before breakfast and                                 dinner. Must be opened and mixed with yogurt,                                 applesauce or similar consistency. Would also                                 consider a 2 week course of sucralfate slurry.                                 - Repeat EGD in 8-12 weeks to assess healing of                                 the ulceration.                                 - Avoid NSAIDS, such as ibuprofen, advil,                                 aleve, motrin, mobic, celebrex, naproxen.       Repeat EGD case requested.    GI will sign off at this time. Please reach out w/ any additional questions or concerns.    Carlin JINNY Saxon, MD  Gastroenterology & Hepatology Fellow, PGY-5  [phone]

## 2024-01-12 NOTE — Progress Notes
Daily Progress Note      Today's Date:  01/12/2024  Name:  Danny Kidd                       MRN:  [id number]   Admission Date: 01/10/2024 (LOS: 2 days)                     Assessment: Danny Kidd is a 43 y.o. 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 Will continue to follow.  - 9/23 CT without extravasation of contrast, 9/23 EGD without bleeding ulcer,  perforation, or anastomotic leak.  - Recommend continued BID PPI  - No further antibiotic needs from surgical standpoint.   - Remainder of care per primary  - Please page EGS [phone]) with any questions or concerns.    Lauraine Corona, MD      Discussed with Dr. Leron  _____________________________________________________________________________    Subjective:  No acute events overnight. Patient continues to have bloody bowel movements, denies any nausea or vomiting. Ambulating without difficulty.     Objective:  BP: (122-154)/(76-90)   Temp:  [36.7 ?C (98 ?F)-36.9 ?C (98.4 ?F)]   Pulse:  [60-72]   Respirations:  [0 PER MINUTE-28 PER MINUTE]   SpO2:  [98 %-100 %]   O2 Device: None (Room air)  O2 Liter Flow: 6 Lpm  Body mass index is 28.38 kg/m?[name]    Lab Results   Component Value Date/Time    NA 140 01/12/2024 03:45 AM    K 3.9 01/12/2024 03:45 AM    CL 109 01/12/2024 03:45 AM    CO2 24 01/12/2024 03:45 AM    BUN 18 01/12/2024 03:45 AM    CR 0.71 01/12/2024 03:45 AM    MG 2.0 01/11/2024 04:18 AM    PO4 3.2 01/02/2022 12:14 AM      Lab Results   Component Value Date/Time    HGB 9.9 (L) 01/12/2024 03:41 PM    HCT 28.0 (L) 01/12/2024 03:41 PM    WBC 6.10 01/12/2024 03:41 PM    PLTCT 191 01/12/2024 03:41 PM    INR 1.2 01/10/2024 07:03 PM     Lab Results   Component Value Date/Time    GLUPOC 72 01/12/2024 10:51 AM    GLUPOC 72 01/12/2024 10:03 AM    GLUPOC 186 (H) 02/19/2023 08:07 AM          Physical Exam  Gen: NAD, nontoxic  HEENT: NCAT, EOMI, anicteric sclerae  CV: Normal rate  Pulm: Non-labored respirations  Abd: Soft, non distended, n TTP, no R/G   Neuro: GCS 15, MAE spontaneously  Psych: Normal affect                                          Malnutrition Details:                                                   Active Wounds  ___________________________

## 2024-01-12 NOTE — Progress Notes
 Due to language barrier, an interpreter was present during the history-taking and subsequent discussion (and for part of the physical exam) with this patient.    Interpreter mode: Transport planner  Interpreter/ ID Number: [phone]

## 2024-01-12 NOTE — Progress Notes
 Brief Surgery Serial Abdominal Exam    Subjective: patient and comfortably blood, denies any nausea, vomiting. currently NPO, continues to endorse bloody bowel movements.    Objective: Vital signs stablePhysical Exam  Gen: NAD, nontoxic  HEENT: NCAT, EOMI, anicteric sclerae  CV: Normal rate  Pulm: Non-labored respirations  Abd: Soft, non distended, n TTP, no R/G   Neuro: GCS 15, MAE spontaneously  Psych: Normal affect    Assessment/Plan:  - No acute changes, will continue to monitor       Lauraine Corona, MD  Service Pager: [phone]

## 2024-01-12 NOTE — Response Teams
 Rapid Response Team Progress Note    Date: 01/12/2024 Time: 10:32 PM  Patient: Danny Kidd  Attending: Evia Smothers, MD Service: Med Private R [phone]  Admission Date: 01/10/2024  LOS: 2 days    A Code/Rapid Response Timeline Event Report has been created for this patient on 01/12/2024 at 2202    The  physician, Buren Daniels MD, was notified by 2202, of this event.    Patient up ad lib to bathroom at the sink and called for RN because he felt weak. Nurse was helping him back to bed and collapsed on to bed. Patient became unresponsive for a minute. Blood pressure slightly hypotensive during syncopal episode but oxygen saturation was stable. Per provider if MAP drops below 65 please notify and he will order CT. Patient Aox4, VSS will follow up in 2 hours per team.       Duwaine Mura, RN

## 2024-01-13 ENCOUNTER — Encounter
Admit: 2024-01-13 | Discharge: 2024-01-13 | Payer: PRIVATE HEALTH INSURANCE | Primary: Student in an Organized Health Care Education/Training Program

## 2024-01-13 ENCOUNTER — Inpatient Hospital Stay
Admit: 2024-01-13 | Discharge: 2024-01-13 | Payer: PRIVATE HEALTH INSURANCE | Primary: Student in an Organized Health Care Education/Training Program

## 2024-01-13 DIAGNOSIS — K289 Gastrojejunal ulcer, unspecified as acute or chronic, without hemorrhage or perforation: Principal | ICD-10-CM

## 2024-01-13 LAB — SURGICAL PATHOLOGY

## 2024-01-13 LAB — CBC
~~LOC~~ BKR HEMATOCRIT: 28 % — ABNORMAL LOW (ref 40.0–50.0)
~~LOC~~ BKR MCH: 30 pg (ref 26.0–34.0)
~~LOC~~ BKR MCHC: 35 g/dL (ref 32.0–36.0)
~~LOC~~ BKR MCV: 84 fL — ABNORMAL HIGH (ref 80.0–100.0)
~~LOC~~ BKR MPV: 8.5 fL (ref 7.0–11.0)
~~LOC~~ BKR PLATELET COUNT: 268 10*3/uL (ref 150–400)
~~LOC~~ BKR PLATELET COUNT: 184 10*3/uL (ref 150–400)
~~LOC~~ BKR RBC COUNT: 3.3 10*6/uL — ABNORMAL LOW (ref 4.40–5.50)
~~LOC~~ BKR RDW: 13 % (ref 11.0–15.0)
~~LOC~~ BKR RDW: 14 % (ref 11.0–15.0)
~~LOC~~ BKR WBC COUNT: 9.4 10*3/uL (ref 4.50–11.00)
~~LOC~~ BKR WBC COUNT: 6.7 10*3/uL (ref 4.50–11.00)

## 2024-01-13 LAB — COMPREHENSIVE METABOLIC PANEL
~~LOC~~ BKR ALBUMIN: 4 g/dL (ref 3.5–5.0)
~~LOC~~ BKR ALK PHOSPHATASE: 45 U/L (ref 25–110)
~~LOC~~ BKR ALT: 9 U/L (ref 7–56)
~~LOC~~ BKR ANION GAP: 11 (ref 3–12)
~~LOC~~ BKR AST: 13 U/L (ref 7–40)
~~LOC~~ BKR CO2: 24 mmol/L (ref 21–30)
~~LOC~~ BKR GLOMERULAR FILTRATION RATE (GFR): >60 mL/min (ref >60)
~~LOC~~ BKR POTASSIUM: 3.6 mmol/L — ABNORMAL LOW (ref 3.5–5.1)

## 2024-01-13 LAB — EGD REPORT

## 2024-01-13 LAB — POC GLUCOSE
~~LOC~~ BKR POC GLUCOSE: 106 mg/dL — ABNORMAL HIGH (ref 70–100)
~~LOC~~ BKR POC GLUCOSE: 82 mg/dL (ref 70–100)

## 2024-01-13 LAB — HEMOGLOBIN & HEMATOCRIT
~~LOC~~ BKR HEMATOCRIT: 17 % — ABNORMAL LOW (ref 40.0–50.0)
~~LOC~~ BKR HEMOGLOBIN: 6.3 g/dL — ABNORMAL LOW (ref 13.5–16.5)

## 2024-01-13 LAB — POC LACTATE: ~~LOC~~ BKR POC LACTIC ACID: 1.1 mmol/L (ref 0.5–2.0)

## 2024-01-13 MED ORDER — LIDOCAINE (PF) 20 MG/ML (2 %) IJ SOLN
INTRAVENOUS | 0 refills | Status: DC
Start: 2024-01-13 — End: 2024-01-13

## 2024-01-13 MED ORDER — SODIUM CHLORIDE 0.9 % IJ SOLN
50 mL | Freq: Once | INTRAVENOUS | 0 refills | Status: CP
Start: 2024-01-13 — End: ?
  Administered 2024-01-13: 14:00:00 50 mL via INTRAVENOUS

## 2024-01-13 MED ORDER — PROPOFOL 10 MG/ML IV EMUL 20 ML (INFUSION)(AM)(OR)
INTRAVENOUS | 0 refills | Status: DC
Start: 2024-01-13 — End: 2024-01-13
  Administered 2024-01-13: 21:00:00 175 ug/kg/min via INTRAVENOUS

## 2024-01-13 MED ORDER — PROPOFOL INJ 10 MG/ML IV VIAL
INTRAVENOUS | 0 refills | Status: DC
Start: 2024-01-13 — End: 2024-01-13

## 2024-01-13 MED ORDER — LACTATED RINGERS IV BOLUS
500 mL | Freq: Once | INTRAVENOUS | 0 refills | Status: CP
Start: 2024-01-13 — End: ?
  Administered 2024-01-13: 12:00:00 500 mL via INTRAVENOUS

## 2024-01-13 MED ORDER — IOHEXOL 350 MG IODINE/ML IV SOLN
100 mL | Freq: Once | INTRAVENOUS | 0 refills | Status: CP
Start: 2024-01-13 — End: ?
  Administered 2024-01-13: 14:00:00 100 mL via INTRAVENOUS

## 2024-01-13 MED ORDER — LACTATED RINGERS IV SOLP
INTRAVENOUS | 0 refills | Status: AC
Start: 2024-01-13 — End: ?
  Administered 2024-01-13 (×2): 1000.0000 mL via INTRAVENOUS

## 2024-01-13 MED ADMIN — PROCHLORPERAZINE EDISYLATE 5 MG/ML IJ SOLN [6580]: 10 mg | INTRAVENOUS | @ 05:00:00 | Stop: 2024-01-13 | NDC 23155029431

## 2024-01-13 MED ADMIN — LACTATED RINGERS IV SOLP [4318]: 1000 mL | INTRAVENOUS | @ 03:00:00 | Stop: 2024-01-13 | NDC 00338011704

## 2024-01-13 NOTE — Anesthesia Post-Procedure Evaluation
 Post-Anesthesia Evaluation    Name: Burrell Hodapp      MRN: [id number]     DOB: 10-20-1980     Age: 43 y.o.     Sex: male   __________________________________________________________________________     Procedure Information       Anesthesia Start Date/Time: 01/13/24 1532    Procedure: ESOPHAGOGASTRODUODENOSCOPY WITH CONTROL OF BLEEDING - FLEXIBLE    Location: [facility] 3 / ENDO/GI    Surgeons: Leonce Emerick PARAS, DO            Post-Anesthesia Vitals  BP: 108/56 (09/24 1615)  Temp: 36.6 ?C (97.9 ?F) (09/24 1559)  Pulse: 55 (09/24 1615)  Respirations: 23 PER MINUTE (09/24 1615)  SpO2: 100 % (09/24 1615)  O2 Device: Room air (09/24 1620)   Vitals Value Taken Time   BP 108/56 01/13/24 16:15   Temp 36.6 ?C (97.9 ?F) 01/13/24 15:59   Pulse 55 01/13/24 16:15   Respirations 23 PER MINUTE 01/13/24 16:15   SpO2 100 % 01/13/24 16:15   O2 Device Room air  01/13/24 1620   ABP     ART BP           Post Anesthesia Evaluation Note    Evaluation location: other (GI)  Patient participation: recovered; patient participated in evaluation  Level of consciousness: alert  Pain management: adequate    Hydration: normovolemia  Temperature: 36.0?C - 38.4?C  Airway patency: adequate    Perioperative Events       Post-op nausea and vomiting: no PONV    Postoperative Status  Cardiovascular status: hemodynamically stable  Respiratory status: spontaneous ventilation  Follow-up needed: none        Perioperative Events  There were no known complications for this encounter.

## 2024-01-13 NOTE — Progress Notes
 Gastroenterology Progress Note    Assessment/ Plan:  Danny Kidd is a 43 y.o. Spanish-speaking man with a pertinent PMH of Roux-en-Y gastric bypass, heart failure with preserved ejection fraction, hypertension, papillary thyroid carcinoma, and type 2 diabetes. Presented w/ hematemesis.     Hematemesis  Melena, hematochezia   Anastomosis ulceration  Epigastric pain, improved  Acute blood loss anemia  History of Roux-en-Y bypass      Interval GI procedures  EGD completed 9/23 revealing an esophageal ulcer w/ no bleeding nor stigmata of recent bleeding. Evidence of a Roux-en-Y gastrojejunostomy was found. The gastrojejunal anastomosis was characterized by ulceration on the jejunal side that was very difficult to visualize, could only partially see the ulceration and caught glimpses of a small clot. No active bleeding. Due to difficulty in visualization it was decided to not dislodge the clot. The  anastamosis was traversed, few staples were seen on the gastric side of the anastamosis. The pouch-to-jejunum limb was characterized by healthy appearing mucosa and was 6 cm in length.     Interval Labs and Imaging    CTA abd/pelv  IMPRESSION   1. No evidence of active gastrointestinal bleeding.   2.  Prior Roux-en-Y gastric bypass. The previously described apparent   defect near the gastrojejunal anastomosis as well as apparent edema   between the stomach and pancreatic body are not confidently identified on   the current examination.     Hemoglobin   Date Value Ref Range Status   01/13/2024 6.3 (L) 13.5 - 16.5 g/dL Final   90/75/7974 6.3 (L) 13.5 - 16.5 g/dL Final   90/76/7974 89.7 (L) 13.5 - 16.5 g/dL Final   90/76/7974 9.9 (L) 13.5 - 16.5 g/dL Final   90/76/7974 9.1 (L) 13.5 - 16.5 g/dL Final     Blood Urea Nitrogen   Date Value Ref Range Status   01/13/2024 33 (H) 7 - 25 mg/dL Final   90/76/7974 20 7 - 25 mg/dL Final   90/76/7974 18 7 - 25 mg/dL Final     INR   Date Value Ref Range Status   01/10/2024 1.2 0.9 - 1.2 Final     Platelet Count   Date Value Ref Range Status   01/13/2024 191 150 - 400 10*3/uL Final   01/12/2024 268 150 - 400 10*3/uL Final   01/12/2024 191 150 - 400 10*3/uL Final        Recommendations:  -IV PPI BID  -Repeat hgb following blood transfusion 9/24  -Keep NPO  -Plan for EGD 9/24      For emergent needs/concerns after 5pm or weekends: page on-call GI fellow.     Carlin Saxon, MD  Gastroenterology, Hepatology, & Motility    Patient plan of care discussed w/ attending physician, Dr. Chipper.  -----------------------------  Subjective/Interval Events:  Overnight and this morning, patient had two large black and bloody bowel movements w/ an associated hgb drop from 10.2 to 6.3. He reported intermittent nausea but denied emesis. Denies dizziness, lightheadedness.    Stool Amount for the past 24 hrs:   Stool Amount   01/13/24 1000 Large   01/13/24 0800 Large   01/12/24 1706 Medium   01/12/24 1537 Small       Objective:       Review of Systems   All 12 systems otherwise negative.       Physical Exam  Constitutional:       General: He is not in acute distress.  Eyes:  Conjunctiva/sclera: Conjunctivae normal.   Pulmonary:      Effort: Pulmonary effort is normal.   Abdominal:      General: There is no distension.      Palpations: Abdomen is soft.      Tenderness: There is no abdominal tenderness.   Skin:     General: Skin is warm and dry.      Coloration: Skin is pale.   Neurological:      Mental Status: He is alert and oriented to person, place, and time.          Labs/Imaging:  Pertinent labs/imaging were reviewed on initiation of progress note.

## 2024-01-13 NOTE — Response Teams
 Rapid Response Team Progress Note    Date: 01/13/2024 Time: 1:57 AM  Patient: Danny Kidd  Attending: Evia Smothers, MD Service: Med Private R [phone]  Admission Date: 01/10/2024  LOS: 3 days    A 2 hour Rapid Response follow up Timeline Event Report has been created for this patient on 01/13/2024 at 0030    Pt A/Ox4, still receiving meds for nausea. VSS Primary RN has no concerns. Rapid team signing off at this time.    01/13/24 0030   Critical Care Vitals Adult   Pulse 65   Respirations 21 PER MINUTE   SpO2 99 %   O2 Liter Flow 2 Lpm   BP 107/59   Mean NBP (Calculated) 74 MM HG     Duwaine Mura, RN

## 2024-01-13 NOTE — Progress Notes
 Daily Progress Note      Today's Date:  01/13/2024  Name:  Danny Kidd                       MRN:  [id number]   Admission Date: 01/10/2024 (LOS: 3 days)                     Assessment: Batu Cassin is a 43 y.o. 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         Principal Problem:    Hematemesis  Active Problems:    Gastrointestinal hemorrhage    Marginal ulcer        Plan:  - No acute surgical intervention indicated at this time  - 9/23 CT without extravasation of contrast. 9/23 EGD without bleeding ulcer,  perforation, or anastomotic leak. RR overnight for hypotension in setting of ongoing hematochezia.  - Agree with GI evaluation for possible repeat EGD  - Continue BID PPI  - Remainder of care per primary  - Please page EGS [phone]) with any questions or concerns    [place] Zian Delair, MD    Discussed with Dr. Leron  _____________________________________________________________________________    Subjective:  Patient rapid responded to ICU overnight for syncopal episode with hypotension. No worsening abdominal pain. Continues to experience hematochezia. Feeling a little nauseated. Denies vomiting.    Objective:  BP: (81-142)/(50-88)   Temp:  [36.4 ?C (97.6 ?F)-37.1 ?C (98.8 ?F)]   Pulse:  [61-93]   Respirations:  [8 PER MINUTE-29 PER MINUTE]   SpO2:  [83 %-100 %]   O2 Device: None (Room air)  O2 Liter Flow: 2 Lpm  Body mass index is 28.38 kg/m?[name]    Lab Results   Component Value Date/Time    NA 137 01/13/2024 03:39 AM    K 4.6 01/13/2024 03:39 AM    CL 106 01/13/2024 03:39 AM    CO2 26 01/13/2024 03:39 AM    BUN 33 (H) 01/13/2024 03:39 AM    CR 0.88 01/13/2024 03:39 AM    MG 2.0 01/11/2024 04:18 AM    PO4 3.2 01/02/2022 12:14 AM      Lab Results   Component Value Date/Time    HGB 8.5 (L) 01/13/2024 01:23 PM    HCT 23.7 (L) 01/13/2024 01:23 PM    WBC 6.70 01/13/2024 01:23 PM    PLTCT 184 01/13/2024 01:23 PM    INR 1.2 01/10/2024 07:03 PM     Lab Results   Component Value Date/Time    GLUPOC 82 01/13/2024 02:36 PM    GLUPOC 106 (H) 01/12/2024 10:09 PM    GLUPOC 72 01/12/2024 10:51 AM          Physical Exam  Gen: NAD, nontoxic  HEENT: NCAT, EOMI, anicteric sclerae  CV: Normal rate  Pulm: Non-labored respirations  Abd: soft, non-distended, non-TTP, no R/G   Neuro: GCS 15, MAE spontaneously  Psych: Normal affect                                          Malnutrition Details:  Active Wounds                                                      ___________________________

## 2024-01-13 NOTE — Progress Notes
 General Progress Note    Name: Danny Kidd        MRN: [id number]          DOB: 02-02-1981            Age: 43 y.o.  Admission Date: 01/10/2024       LOS: 3 days    Date of Service: 01/13/2024    Assessment/Plan:      43 y.o. 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  -CT angiogram abdomen/pelvis 01/10/2024: Prior bowel [place] gastric bypass with fat stranding between the gastrojejunostomy and anterior aspect of the pancreatic body jejunum adjacent to gastrojejunostomy anastomotic site.  There is no intraperitoneal fluid collection, significant ascites, or pneumoperitoneum.   -CT w oral contrast abdomen/pelvis: Prior Roux-en-Y gastric bypass, with similar ill-defined stranding   between the stomach and the pancreas. No extraluminal contrast extravasation of the administration oral   contrast to suggest perforation. No pneumoperitoneum or ascites.     - CXR: stable mild cardiomegaly  - Hgb dropped to 6.3 today from 10.2 yesterday  - Received LR bolus  - Received one unit PRBCs 9/21, 2 more units received 9/24, repeat hgb 8.2, will transfuse as needed for hgb>8 or symptomatic anemia   - BUN elevated at 33   - Lactic acid within normal limits at 1.1   - Soft, nondistended, nontender to palpation without evidence of  peritonitis on exam  - GI consulted  - EGD 9/23: One superficial esophageal ulcer with no bleeding was found 41cm from the incisors. The lesion was 3mm in largest dimesnion. 1ch hiatal hernia was present. 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 Biopsies were taken via cold forcepts and sent to pathology.  - GI recs: resume diet, omeprazole BID 8wks, conside sucralate slurry for 2 wks, avoid NSAIDs, repeat EGD 8-12 weeks.  - Surgery consulted   - BID IV protonix  40mg   - regular diet  - DVT prophylaxis held  - CBC every 6 hours  - CT abd/pelv today: No evidence of active gastrointestinal bleeding. Prior Roux-en-Y gastric bypass. The previously described apparent defect near the gastrojejunal anastomosis as well as apparent edema between the stomach and pancreatic body are not confidently identified on the current examination.   - EGD today, GI fellow was alerted for expedition of procedure in setting of active hematochezia.    HFpEF, not in acute exacerbation  Hypertension  Tobacco Use   - CXR 9/21/2: Stable mild cardiomegaly without abnormality  -TTE 12/26/2022: Moderately dilated left ventricle ejection fraction of 50%.  Indeterminate left ventricular diastolic dysfunction.  40 mmHg.  CVP 10 to 20 mmHg.  - PTA diuresis: Lasix  40 mg daily  - PTA GDMT: Losartan  100 mg daily metoprolol  succinate 100 mg daily  - Will hold home meds in setting of acute bleed   - nicotine  patch prn     DM2  - A1C: 8.5 02/19/2024  - Patient not taking metformin  as per endocrinologist a few months ago, pt  was taking metformin  1000mg  BID prior  - 110lb wt loss since roux en Y 03/2023  - Repeat A1C outpatient     Leukocytosis - resolved  - Mild elevated WBC on admission to 13.9, most likely reactive  - WBC 4.7 today   - Blood cultures ordered, preliminary result no growth at 24 hours     Diet: NPO  VTE ppx: none in setting of acute bleed  CODE: Full Code     DISPO: stabilization of acute bleeding and hgb, GI recs and EGD  Case discussed with Dr. Evia    Present on Admission:  **None**                                 ATTESTATION    I personally performed or re-performed the history, physical exam and treatment for the E/M. I discussed the case with the Medical Student, and concur with the Medical Student documentation of history, physical exam and treatment plan unless otherwise noted.     Overnight patient rapid responded.  Had episode of hypotension and syncope.  He continued to have multiple episodes of GI bleeding including melena.  He reports he was not feeling well at all.  Today morning he was fluid resuscitated received multiple units of packed RBCs.  Overall doing well during my time of visit.  Denies any chest pain, shortness of breath.  He did report dizziness.  Labs were reviewed.  Noted for acute blood loss anemia with hemoglobin of 6.3 g/dL.  Otherwise stable.  Discussed with gastroenterology fellow.  Plan to repeat EGD today.    Exam stable.     E and M visit performed with help of interpreter ID [id number].    Staff name:  Balinda Evia, MD Date: 01/13/2024       _______________________________________________________________________    Subjective   Danny Kidd is a 43 y.o. male.  Patient had an episode of syncope overnight in which he made his way to the bathroom and became syncopal and required nurse assistance to return back to his bed which he collapsed onto. He was unresponsive for about one minute and a rapid response was called. Patient was hypotensive at that time with BP 81/51, O2 sat was stable. Hbg was 6.3. He was bolused fluids and 2 units PRBCs were ordered. Pt was then Aox4 and VSS. He had one more episode of large volume hematochezia this morning, as well as two more episodes in the afternoon. Repeat Hgb 8.2 after transfusion of 2 units.       Medications  Scheduled Meds:gabapentin  (NEURONTIN ) capsule 300 mg, 300 mg, Oral, TID  lidocaine  (LIDODERM ) 5 % topical patch 1 patch, 1 patch, Topical, QDAY  nicotine  (NICODERM CQ ) 14 mg/day patch 1 patch, 1 patch, Transdermal, QDAY  pantoprazole  (PROTONIX ) injection 40 mg, 40 mg, Intravenous, API(88-78)  rosuvastatin  (CRESTOR ) tablet 20 mg, 20 mg, Oral, QDAY    Continuous Infusions:   lactated ringers  infusion 100 mL/hr at 01/13/24 0632     PRN and Respiratory Meds:acetaminophen  Q4H PRN, cetirizine QDAY PRN, melatonin QHS PRN, ondansetron  Q6H PRN **OR** ondansetron  Q6H PRN, oxyCODONE  Q6H PRN, polyethylene glycol 3350 QDAY PRN, prochlorperazine  edisylate Q6H PRN, sennosides-docusate sodium QDAY PRN         Objective                        Vital Signs: Last Filed  Vital Signs: 24 Hour Range   BP: 101/60 (09/24 0700)  Temp: 36.8 ?C (98.2 ?F) (09/23 2034)  Pulse: 65 (09/24 0700)  Respirations: 21 PER MINUTE (09/24 0700)  SpO2: 100 % (09/24 0700)  O2 Device: Nasal cannula (09/24 0700)  O2 Liter Flow: 2 Lpm (09/24 0700) BP: (81-154)/(50-90)   Temp:  [36.7 ?C (98 ?F)-36.8 ?C (98.2 ?F)]   Pulse:  [60-93]   Respirations:  [0 PER MINUTE-29 PER MINUTE]   SpO2:  [83 %-100 %]   O2 Device: Nasal cannula  O2 Liter Flow: 2 Lpm   Intensity Pain Scale (Self Report): 2 (01/12/24 1805) Vitals:    01/10/24 2010 01/11/24 0415   Weight: 80.8 kg (178 lb 2.1 oz) 80.1 kg (176 lb 9.4 oz)         Intake/Output Summary:  (Last 24 hours)    Intake/Output Summary (Last 24 hours) at 01/13/2024 9187  Last data filed at 01/12/2024 1026  Gross per 24 hour   Intake 200 ml   Output --   Net 200 ml     Stool Occurrence: 0        Physical Exam  General appearance: alert, cooperative, and no distress  Head: Normocephalic, without obvious abnormality, atraumatic  Lungs: clear to auscultation bilaterally  Chest wall: no tenderness  Heart: regular rate and rhythm, S1, S2 normal, no murmur, click, rub or gallop  Abdomen: soft, non-tender. Bowel sounds normal. No masses,  no organomegaly  Peripheral pulses:  2+ and symmetric  Cap Refill:  normal    Lab Review  24-hour labs:    Results for orders placed or performed during the hospital encounter of 01/10/24 (from the past 24 hours)   POC GLUCOSE    Collection Time: 01/12/24 10:51 AM   Result Value Ref Range Glucose, POC 72 70 - 100 mg/dL   CBC    Collection Time: 01/12/24  3:41 PM   Result Value Ref Range    White Blood Cells 6.10 4.50 - 11.00 10*3/uL    Red Blood Cells 3.30 (L) 4.40 - 5.50 10*6/uL    Hemoglobin 9.9 (L) 13.5 - 16.5 g/dL    Hematocrit 71.9 (L) 40.0 - 50.0 %    MCV 84.8 80.0 - 100.0 fL    MCH 29.8 26.0 - 34.0 pg    MCHC 35.2 32.0 - 36.0 g/dL    RDW 85.9 88.9 - 84.9 %    Platelet Count 191 150 - 400 10*3/uL    MPV 8.6 7.0 - 11.0 fL   POC GLUCOSE    Collection Time: 01/12/24 10:09 PM   Result Value Ref Range    Glucose, POC 106 (H) 70 - 100 mg/dL   CBC    Collection Time: 01/12/24 10:16 PM   Result Value Ref Range    White Blood Cells 9.40 4.50 - 11.00 10*3/uL    Red Blood Cells 3.38 (L) 4.40 - 5.50 10*6/uL    Hemoglobin 10.2 (L) 13.5 - 16.5 g/dL    Hematocrit 71.4 (L) 40.0 - 50.0 %    MCV 84.4 80.0 - 100.0 fL    MCH 30.1 26.0 - 34.0 pg    MCHC 35.7 32.0 - 36.0 g/dL    RDW 86.0 88.9 - 84.9 %    Platelet Count 268 150 - 400 10*3/uL    MPV 8.5 7.0 - 11.0 fL   COMPREHENSIVE METABOLIC PANEL    Collection Time: 01/12/24 10:16 PM   Result Value Ref Range    Sodium 138  137 - 147 mmol/L    Potassium 3.6 3.5 - 5.1 mmol/L    Chloride 103 98 - 110 mmol/L    Glucose 130 (H) 70 - 100 mg/dL    Blood Urea Nitrogen 20 7 - 25 mg/dL    Creatinine 9.02 9.59 - 1.24 mg/dL    Calcium 9.0 8.5 - 89.3 mg/dL    Total Protein 6.2 6.0 - 8.0 g/dL    Total Bilirubin 0.6 0.2 - 1.3 mg/dL    Albumin 4.0 3.5 - 5.0 g/dL    Alk Phosphatase 45 25 - 110 U/L    AST 13 7 - 40 U/L    ALT 9 7 - 56 U/L    CO2 24 21 - 30 mmol/L    Anion Gap 11 3 - 12    Glomerular Filtration Rate (GFR) >60 >60 mL/min   POC LACTATE    Collection Time: 01/12/24 10:20 PM   Result Value Ref Range    LACTIC ACID POC 1.1 0.5 - 2.0 mmol/L   COMPREHENSIVE METABOLIC PANEL    Collection Time: 01/13/24  3:39 AM   Result Value Ref Range    Sodium 137 137 - 147 mmol/L    Potassium 4.6 3.5 - 5.1 mmol/L    Chloride 106 98 - 110 mmol/L    Glucose 149 (H) 70 - 100 mg/dL    Blood Urea Nitrogen 33 (H) 7 - 25 mg/dL    Creatinine 9.11 9.59 - 1.24 mg/dL    Calcium 7.9 (L) 8.5 - 10.6 mg/dL    Total Protein 4.6 (L) 6.0 - 8.0 g/dL    Total Bilirubin 0.4 0.2 - 1.3 mg/dL    Albumin 3.0 (L) 3.5 - 5.0 g/dL    Alk Phosphatase 25 25 - 110 U/L    AST 12 7 - 40 U/L    ALT 9 7 - 56 U/L    CO2 26 21 - 30 mmol/L    Anion Gap 5 3 - 12    Glomerular Filtration Rate (GFR) >60 >60 mL/min   CBC AND DIFF    Collection Time: 01/13/24  5:46 AM   Result Value Ref Range    White Blood Cells 6.30 4.50 - 11.00 10*3/uL    Red Blood Cells 2.13 (L) 4.40 - 5.50 10*6/uL    Hemoglobin 6.3 (L) 13.5 - 16.5 g/dL    Hematocrit 81.8 (L) 40.0 - 50.0 %    MCV 84.7 80.0 - 100.0 fL    MCH 29.7 26.0 - 34.0 pg    MCHC 35.0 32.0 - 36.0 g/dL    RDW 86.2 88.9 - 84.9 %    Platelet Count 191 150 - 400 10*3/uL    MPV 8.6 7.0 - 11.0 fL    Neutrophils 82.8 (H) 41.0 - 77.0 %    Lymphocytes 13.0 (L) 24.0 - 44.0 %    Monocytes 3.9 (L) 4.0 - 12.0 %    Eosinophils 0.1 0.0 - 5.0 %    Basophils 0.2 0.0 - 2.0 %    Absolute Neutrophil Count 5.20 1.80 - 7.00 10*3/uL    Absolute Lymph Count 0.80 (L) 1.00 - 4.80 10*3/uL    Absolute Monocyte Count 0.20 0.00 - 0.80 10*3/uL    Absolute Eosinophil Count 0.00 0.00 - 0.45 10*3/uL    Absolute Basophil Count 0.00 0.00 - 0.20 10*3/uL    MDW (Monocyte Distribution Width) 16.9 <=20.6   HEMOGLOBIN & HEMATOCRIT    Collection Time: 01/13/24  6:29 AM   Result Value Ref  Range    Hemoglobin 6.3 (L) 13.5 - 16.5 g/dL    Hematocrit 82.2 (L) 40.0 - 50.0 %       Point of Care Testing  (Last 24 hours)  Glucose: (!) 149 (01/13/24 0339)  POC Glucose (Download): (!) 106 (01/12/24 2209)    Radiology and other Diagnostics Review:    Pertinent radiology reviewed.     Bernardino Percy   Pager

## 2024-01-13 NOTE — Anesthesia Pain Rounding
 Anesthesia Follow-Up Evaluation: Post-Procedure Day One    Name: Danny Kidd     MRN: [id number]     DOB: 25-Mar-1981     Age: 43 y.o.     Sex: male   __________________________________________________________________________     Procedure Date: 01/12/2024   Procedure: Procedure(s):  ESOPHAGOGASTRODUODENOSCOPY, WITH BIOPSY    Physical Assessment  Height: 168 cm (5' 6.14)  Weight: 80.1 kg (176 lb 9.4 oz)    Vital Signs (Last Filed in 24 hours)  BP: 111/62 (09/24 1000)  Temp: 36.6 ?C (97.8 ?F) (09/24 1000)  Pulse: 63 (09/24 1000)  Respirations: 19 PER MINUTE (09/24 1000)  SpO2: 95 % (09/24 1000)  O2 Device: None (Room air) (09/24 1000)  O2 Liter Flow: 2 Lpm (09/24 0700)    Patient History   Allergies  Allergies[1]     Medications  Scheduled Meds:gabapentin  (NEURONTIN ) capsule 300 mg, 300 mg, Oral, TID  lidocaine  (LIDODERM ) 5 % topical patch 1 patch, 1 patch, Topical, QDAY  nicotine  (NICODERM CQ ) 14 mg/day patch 1 patch, 1 patch, Transdermal, QDAY  pantoprazole  (PROTONIX ) injection 40 mg, 40 mg, Intravenous, API(88-78)  rosuvastatin  (CRESTOR ) tablet 20 mg, 20 mg, Oral, QDAY    Continuous Infusions:  ? lactated ringers  infusion 100 mL/hr at 01/13/24 0632     PRN and Respiratory Meds:acetaminophen  Q4H PRN, cetirizine QDAY PRN, melatonin QHS PRN, ondansetron  Q6H PRN **OR** ondansetron  Q6H PRN, oxyCODONE  Q6H PRN, polyethylene glycol 3350 QDAY PRN, prochlorperazine  edisylate Q6H PRN, sennosides-docusate sodium QDAY PRN      Diagnostic Tests  Hematology:   Lab Results   Component Value Date    HGB 6.3 01/13/2024    HGB 15.1 02/19/2023    HCT 17.7 01/13/2024    HCT 44.1 02/19/2023    PLTCT 191 01/13/2024    PLTCT 183 02/19/2023    WBC 6.30 01/13/2024    WBC 8.2 02/19/2023    NEUT 82.8 01/13/2024    NEUT 67 02/19/2023    ANC 5.20 01/13/2024    ANC 5.63 02/19/2023    ALC 0.80 01/13/2024    ALC 1.46 02/19/2023    MONA 3.9 01/13/2024    MONA 7 02/19/2023    AMC 0.20 01/13/2024    AMC 0.54 02/19/2023    EOSA 0.1 01/13/2024 EOSA 7 02/19/2023    ABC 0.00 01/13/2024    ABC 0.07 02/19/2023    MCV 84.7 01/13/2024    MCV 89.4 02/19/2023    MCH 29.7 01/13/2024    MCH 30.6 02/19/2023    MCHC 35.0 01/13/2024    MCHC 34.2 02/19/2023    MPV 8.6 01/13/2024    MPV 9.2 02/19/2023    RDW 13.7 01/13/2024    RDW 15.6 02/19/2023         General Chemistry:   Lab Results   Component Value Date    NA 137 01/13/2024    NA 139 02/19/2023    K 4.6 01/13/2024    K 3.7 02/19/2023    CL 106 01/13/2024    CL 104 02/19/2023    CO2 26 01/13/2024    CO2 24 02/19/2023    GAP 5 01/13/2024    GAP 11 02/19/2023    BUN 33 01/13/2024    BUN 18 02/19/2023    CR 0.88 01/13/2024    CR 1.04 02/19/2023    GLU 149 01/13/2024    GLU 207 02/19/2023    CA 7.9 01/13/2024    CA 8.7 02/19/2023    ALBUMIN 3.0 01/13/2024  ALBUMIN 3.9 02/19/2023    MG 2.0 01/11/2024    MG 1.8 02/19/2023    TOTBILI 0.4 01/13/2024    TOTBILI 1.1 02/19/2023    PO4 3.2 01/02/2022      Coagulation:   Lab Results   Component Value Date    PT 14.1 01/10/2024    PTT 25.7 01/10/2024    INR 1.2 01/10/2024         Follow-Up Assessment  Patient location during evaluation: floor      Anesthetic Complications:   Anesthetic complications: The patient did not experience any anesthestic complications.      Pain:  Score: 0    Management:adequate     Level of Consciousness: awake and alert   Hydration:acceptable     Airway Patency: patent   Respiratory Status: acceptable and room air     Cardiovascular Status:acceptable and hemodynamically stable   Regional/Neuroaxial:              Comments: Pt is NPO. Has not been up OOB. Denies any Nausea                 [1]  No Known Allergies

## 2024-01-14 ENCOUNTER — Encounter
Admit: 2024-01-14 | Discharge: 2024-01-14 | Payer: PRIVATE HEALTH INSURANCE | Primary: Student in an Organized Health Care Education/Training Program

## 2024-01-14 LAB — PREPARE RBC - SUNQUEST
ANTIBODY SCREEN: NEGATIVE
BLOOD EXPIRATION DATE: 202
BLOOD EXPIRATION DATE: 202
BLOOD EXPIRATION DATE: 202
CODING STATUS: 620
CODING STATUS: 620
CODING STATUS: 620
ISSUE DATE TIME: 202
ISSUE DATE TIME: 202
ISSUE DATE TIME: 202
PRODUCT CODE: 0
PRODUCT CODE: 0
PRODUCT CODE: 0
UNIT DIVISION: 0
UNIT DIVISION: 0
UNIT DIVISION: 0
UNITS ORDERED: 3

## 2024-01-14 LAB — CBC
~~LOC~~ BKR HEMATOCRIT: 24 % — ABNORMAL LOW (ref 40.0–50.0)
~~LOC~~ BKR HEMATOCRIT: 22 % — ABNORMAL LOW (ref 40.0–50.0)
~~LOC~~ BKR HEMATOCRIT: 26 % — ABNORMAL LOW (ref 40.0–50.0)
~~LOC~~ BKR HEMOGLOBIN: 8.5 g/dL — ABNORMAL LOW (ref 13.5–16.5)
~~LOC~~ BKR HEMOGLOBIN: 8 g/dL — ABNORMAL LOW (ref 13.5–16.5)
~~LOC~~ BKR HEMOGLOBIN: 9.2 g/dL — ABNORMAL LOW (ref 13.5–16.5)
~~LOC~~ BKR MCH: 30 pg (ref 26.0–34.0)
~~LOC~~ BKR MCH: 31 pg (ref 26.0–34.0)
~~LOC~~ BKR MCH: 30 pg (ref 26.0–34.0)
~~LOC~~ BKR MCHC: 35 g/dL (ref 32.0–36.0)
~~LOC~~ BKR MCHC: 35 g/dL (ref 32.0–36.0)
~~LOC~~ BKR MCV: 86 fL (ref 80.0–100.0)
~~LOC~~ BKR MCV: 86 fL (ref 80.0–100.0)
~~LOC~~ BKR MCV: 86 fL (ref 80.0–100.0)
~~LOC~~ BKR MPV: 8.9 fL (ref 7.0–11.0)
~~LOC~~ BKR MPV: 8.6 fL (ref 7.0–11.0)
~~LOC~~ BKR MPV: 8.9 fL (ref 7.0–11.0)
~~LOC~~ BKR PLATELET COUNT: 164 10*3/uL (ref 150–400)
~~LOC~~ BKR PLATELET COUNT: 169 10*3/uL (ref 150–400)
~~LOC~~ BKR PLATELET COUNT: 195 10*3/uL (ref 150–400)
~~LOC~~ BKR RBC COUNT: 2.8 10*6/uL — ABNORMAL LOW (ref 4.40–5.50)
~~LOC~~ BKR RBC COUNT: 2.5 10*6/uL — ABNORMAL LOW (ref 4.40–5.50)
~~LOC~~ BKR RBC COUNT: 3 10*6/uL — ABNORMAL LOW (ref 4.40–5.50)
~~LOC~~ BKR RDW: 13 % (ref 11.0–15.0)
~~LOC~~ BKR RDW: 13 % (ref 11.0–15.0)
~~LOC~~ BKR RDW: 13 % (ref 11.0–15.0)
~~LOC~~ BKR WBC COUNT: 6.3 10*3/uL (ref 4.50–11.00)
~~LOC~~ BKR WBC COUNT: 5.9 10*3/uL (ref 4.50–11.00)
~~LOC~~ BKR WBC COUNT: 6.8 10*3/uL (ref 4.50–11.00)

## 2024-01-14 LAB — TYPE & CROSSMATCH
~~LOC~~ BKR ANTIBODY SCREEN: NEGATIVE
~~LOC~~ BKR UNITS ORDERED: 3

## 2024-01-14 MED ORDER — POTASSIUM CHLORIDE 20 MEQ PO TBTQ
60 meq | Freq: Once | ORAL | 0 refills | Status: CP
Start: 2024-01-14 — End: ?
  Administered 2024-01-14: 15:00:00 60 meq via ORAL

## 2024-01-14 MED ORDER — MAGNESIUM SULFATE IN D5W 1 GRAM/100 ML IV PGBK
1 g | INTRAVENOUS | 0 refills | Status: DC | PRN
Start: 2024-01-14 — End: 2024-01-19

## 2024-01-14 MED ORDER — POTASSIUM CHLORIDE 20 MEQ PO TBTQ
40-60 meq | ORAL | 0 refills | Status: DC | PRN
Start: 2024-01-14 — End: 2024-01-14
  Administered 2024-01-14: 10:00:00 40 meq via ORAL

## 2024-01-14 MED ORDER — POTASSIUM CHLORIDE 20 MEQ/15 ML PO LIQD
40-60 meq | NASOGASTRIC | 0 refills | Status: DC | PRN
Start: 2024-01-14 — End: 2024-01-14

## 2024-01-14 MED ORDER — MAGNESIUM OXIDE 400 MG (241.3 MG MAGNESIUM) PO TAB
400 mg | ORAL | 0 refills | Status: DC | PRN
Start: 2024-01-14 — End: 2024-01-19
  Administered 2024-01-17 – 2024-01-18 (×2): 400 mg via ORAL

## 2024-01-14 MED ORDER — POTASSIUM CHLORIDE IN WATER 10 MEQ/50 ML IV PGBK
10 meq | INTRAVENOUS | 0 refills | Status: DC | PRN
Start: 2024-01-14 — End: 2024-01-14

## 2024-01-14 MED ORDER — POTASSIUM CHLORIDE 20 MEQ PO TBTQ
40 meq | Freq: Once | ORAL | 0 refills | Status: CN
Start: 2024-01-14 — End: ?

## 2024-01-14 NOTE — Anesthesia Pain Rounding
 Anesthesia Follow-Up Evaluation: Post-Procedure Day One    Name: Danny Kidd     MRN: [id number]     DOB: 01-16-1981     Age: 43 y.o.     Sex: male   __________________________________________________________________________     Procedure Date: 01/13/2024   Procedure: Procedure(s):  ESOPHAGOGASTRODUODENOSCOPY WITH CONTROL OF BLEEDING - FLEXIBLE    Physical Assessment  Height: 168 cm (5' 6.14)  Weight: 80.1 kg (176 lb 9.4 oz)    Vital Signs (Last Filed in 24 hours)  BP: 132/76 (09/25 0400)  Temp: 36.8 ?C (98.2 ?F) (09/25 0800)  Pulse: 61 (09/25 0400)  Respirations: 19 PER MINUTE (09/25 0400)  SpO2: 99 % (09/25 0400)  O2 Device: None (Room air) (09/25 0400)  O2 Liter Flow: 6 Lpm (09/24 1559)    Patient History   Allergies  Allergies[1]     Medications  Scheduled Meds:gabapentin  (NEURONTIN ) capsule 300 mg, 300 mg, Oral, TID  lidocaine  (LIDODERM ) 5 % topical patch 1 patch, 1 patch, Topical, QDAY  nicotine  (NICODERM CQ ) 14 mg/day patch 1 patch, 1 patch, Transdermal, QDAY  pantoprazole  (PROTONIX ) injection 40 mg, 40 mg, Intravenous, API(88-78)  rosuvastatin  (CRESTOR ) tablet 20 mg, 20 mg, Oral, QDAY    Continuous Infusions:  PRN and Respiratory Meds:acetaminophen  Q4H PRN, cetirizine QDAY PRN, magnesium  oxide PRN **OR** magnesium  sulfate PRN (On Call from Rx), melatonin QHS PRN, ondansetron  Q6H PRN **OR** ondansetron  Q6H PRN, oxyCODONE  Q6H PRN, polyethylene glycol 3350 QDAY PRN, potassium chloride  PRN **OR** potassium chloride  PRN **OR** potassium chloride  in water PRN (On Call from Rx), prochlorperazine  edisylate Q6H PRN, sennosides-docusate sodium QDAY PRN      Diagnostic Tests  Hematology:   Lab Results   Component Value Date    HGB 8.0 01/14/2024    HGB 15.1 02/19/2023    HCT 22.4 01/14/2024    HCT 44.1 02/19/2023    PLTCT 169 01/14/2024    PLTCT 183 02/19/2023    WBC 5.90 01/14/2024    WBC 8.2 02/19/2023    NEUT 67.3 01/14/2024    NEUT 67 02/19/2023    ANC 4.40 01/14/2024    ANC 5.63 02/19/2023    ALC 1.60 01/14/2024    ALC 1.46 02/19/2023    MONA 6.1 01/14/2024    MONA 7 02/19/2023    AMC 0.40 01/14/2024    AMC 0.54 02/19/2023    EOSA 0.9 01/14/2024    EOSA 7 02/19/2023    ABC 0.00 01/14/2024    ABC 0.07 02/19/2023    MCV 86.7 01/14/2024    MCV 89.4 02/19/2023    MCH 31.0 01/14/2024    MCH 30.6 02/19/2023    MCHC 35.7 01/14/2024    MCHC 34.2 02/19/2023    MPV 8.6 01/14/2024    MPV 9.2 02/19/2023    RDW 13.8 01/14/2024    RDW 15.6 02/19/2023         General Chemistry:   Lab Results   Component Value Date    NA 140 01/14/2024    NA 139 02/19/2023    K 3.3 01/14/2024    K 3.7 02/19/2023    CL 107 01/14/2024    CL 104 02/19/2023    CO2 25 01/14/2024    CO2 24 02/19/2023    GAP 8 01/14/2024    GAP 11 02/19/2023    BUN 19 01/14/2024    BUN 18 02/19/2023    CR 0.79 01/14/2024    CR 1.04 02/19/2023    GLU 87 01/14/2024    GLU  207 02/19/2023    CA 8.1 01/14/2024    CA 8.7 02/19/2023    ALBUMIN 3.1 01/14/2024    ALBUMIN 3.9 02/19/2023    MG 2.0 01/11/2024    MG 1.8 02/19/2023    TOTBILI 0.6 01/14/2024    TOTBILI 1.1 02/19/2023    PO4 3.2 01/02/2022      Coagulation:   Lab Results   Component Value Date    PT 14.1 01/10/2024    PTT 25.7 01/10/2024    INR 1.2 01/10/2024         Follow-Up Assessment  Patient location during evaluation: ICU      Anesthetic Complications:   Anesthetic complications: The patient did not experience any anesthestic complications.      Pain:  Score: 0    Management:adequate     Level of Consciousness: awake and alert   Hydration:acceptable     Airway Patency: patent   Respiratory Status: acceptable and spontaneous ventilation     Cardiovascular Status:acceptable and hemodynamically stable   Regional/Neuroaxial:                             [1]  No Known Allergies

## 2024-01-14 NOTE — Progress Notes
 Brief GI Note    EGD completed 9/24 w/ results as below.    Impression:                   - One superficial esophageal ulcer with no                                 stigmata of recent bleeding was found at the                                 GEJ. The lesion was 3 mm in largest dimension.                                 Improving from EGD 01/12/24.                                 - Evidence of a Roux-en-Y gastrojejunostomy was                                 found. The gastrojejunal anastomosis was                                 characterized by ulceration on the jejunal                                 side. Positioning was difficult with continued                                 slippage. Few flat red spots were seen. Another                                 non-pulsating bulge was also evaluated without                                 obvious vessel. Second endoscopist also viewed                                 this area. Friability along the borders, but no                                 active bleeding. Coagulation for bleeding                                 prevention using bipolar probe was successful                                 and Nexpowder then applied.  This was traversed and jejunum was normal in                                 appearance. The pouch-to-jejunum limb was                                 characterized by ulcerations that are likely                                 the biopsy sites taken during the EGD 01/12/24.                                 - Normal examined jejunum.     Recommend discharging w/ PPI BID and carafate 1g QID.    This patient will be arranged for a GI clinic follow up due to on-going needs of outpatient continuity of care.   GI panel: Giovan Pinsky/Swaminathan  Time frame to be seen: 3 months  With whom hospital follow up will be scheduled: Kiyanna Biegler  Items to be addressed at follow-up: Repeat EGD results, resolution of melena  GI pending items at discharge: none      GI will sign off at this time. Please reach out w/ any additional questions or concerns.    Carlin JINNY Saxon, MD  Gastroenterology & Hepatology Fellow, PGY-5  [phone]

## 2024-01-14 NOTE — Case Management (ED)
 Case Management Progress Note    NAME:Danny Kidd                          MRN: [id number]              DOB:1980-07-14          AGE: 43 y.o.  ADMISSION DATE: 01/10/2024             DAYS ADMITTED: LOS: 4 days      Today's Date: 01/14/2024    PLAN: Patient anticipated to discharge home once medically stable with no identified NCM needs.    Expected Discharge Date: 01/15/2024   Is Patient Medically Stable: No, Please explain: awaiting final GI recommendations.  Are there Barriers to Discharge? No    INTERVENTION/DISPOSITION:  Discharge Planning              Discharge Planning: No Needs Identified  NCM reviewed EMR.  NCM attended and participated in [facility] huddle.  Patient not medically stable for discharge today. Patient s/p urgent EGD with GI on 01/13/24. Awaiting final GI recommendations.  No anticipated discharge NCM needs. Will continue to follow for discharge planning.  Transportation              Does the Patient Need Case Management to Arrange Discharge Transport? (ex: facility, ambulance, wheelchair/stretcher, Medicaid, cab, other): No  Will the Patient Use Family Transport?: Yes  Transportation Name, Phone and Availability #1: Patient's partner [name] [phone] will provide transportation upon discharge.  Support              Support: Huddle/team update  Info or Referral              Information or Referral to Community Resources: No Needs Identified  Positive SDOH Domains and Potential Barriers      Medication Needs              Medication Needs: No Needs Identified  Financial              Financial: No Needs Identified  Legal              Legal: No Needs Identified  Other              Other/None: No needs identified  Discharge Disposition                                            Selected Continued Care - Admitted Since 01/10/2024    No services have been selected for the patient.       Larraine Blumenthal, BSN, RN  [facility] Nurse Case Manager  [phone] and available on Voalte

## 2024-01-14 NOTE — Progress Notes
 General Progress Note    Name: Jarid Sasso        MRN: [id number]          DOB: Jul 18, 1980            Age: 43 y.o.  Admission Date: 01/10/2024       LOS: 4 days    Date of Service: 01/14/2024    Assessment/Plan:      43 y.o. 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  -CT angiogram abdomen/pelvis 01/10/2024: Prior bowel [place] gastric bypass with fat stranding between the gastrojejunostomy and anterior aspect of the pancreatic body jejunum adjacent to gastrojejunostomy anastomotic site.  There is no intraperitoneal fluid collection, significant ascites, or pneumoperitoneum.   -CT w oral contrast abdomen/pelvis: Prior Roux-en-Y gastric bypass, with similar ill-defined stranding   between the stomach and the pancreas. No extraluminal contrast extravasation of the administration oral   contrast to suggest perforation. No pneumoperitoneum or ascites.     - CXR: stable mild cardiomegaly  - Hgb 8.0  - Received one unit PRBCs 9/21, 2 more units received 9/24, repeat hgb 8.2, will transfuse as needed for hgb<8 or symptomatic anemia   - BUN elevated at 33   - Lactic acid within normal limits at 1.1   - Soft, nondistended, nontender to palpation without evidence of  peritonitis on exam  - GI consulted  - EGD 9/23: One superficial esophageal ulcer with no bleeding was found 41cm from the incisors. The lesion was 3mm in largest dimesnion. 1ch hiatal hernia was present. 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 Biopsies were taken via cold forcepts and sent to pathology.  - Surgery consulted   - BID IV protonix  40mg   - regular diet  - DVT prophylaxis held  - CBC every 6 hours  - CT abd/pelv 9/23: No evidence of active gastrointestinal bleeding. Prior Roux-en-Y gastric bypass. The previously described apparent defect near the gastrojejunal anastomosis as well as apparent edema between the stomach and pancreatic body are not confidently identified on the current examination.   - EGD 9/24: Evidence of a Roux-en-Y gastrojejunostomy was found. The gastrojejunal anastomosis was characterized by ulceration on the jejunal side. Positioning was difficult with continued slippage. Few flat red spots were seen. Another non-pulsating bulge was also evaluated without obvious vessel. Second endoscopist also viewed this area. Friability along the borders, but no active bleeding. Coagulation for bleeding prevention using bipolar probe was successful and Nexpowder then applied.    - GI recs: resume diet, omeprazole BID 8wks, conside sucralate slurry for 2 wks, avoid NSAIDs, smoking cessation, repeat EGD 8-12 weeks.    HFpEF, not in acute exacerbation  Hypertension  Tobacco Use   - CXR 9/21/2: Stable mild cardiomegaly without abnormality  -TTE 12/26/2022: Moderately dilated left ventricle ejection fraction of 50%.  Indeterminate left ventricular diastolic dysfunction.  40 mmHg.  CVP 10 to 20 mmHg.  - PTA diuresis: Lasix  40 mg daily  - PTA GDMT: Losartan  100 mg daily  metoprolol  succinate 100 mg daily  - Will hold home meds in setting of acute bleed   - nicotine  patch prn     DM2  - A1C: 8.5 02/19/2024  - Patient not taking metformin  as per endocrinologist a few months ago, pt was taking metformin  1000mg  BID prior  - 110lb wt loss since roux en Y 03/2023  - Repeat A1C outpatient     Leukocytosis - resolved  - Mild elevated WBC on admission to 13.9, most likely reactive  - WBC 5.9 today   - Blood cultures ordered, preliminary result no growth at 24 hours     Diet: Clear liquids: advance as tolerated  VTE ppx: none in setting of acute bleed  CODE: Full Code     DISPO: stabilization of acute bleeding and hgb    Case discussed with Dr. Evia    ATTESTATION    I personally performed or re-performed the history, physical exam and treatment for the E/M. I discussed the case with the Medical Student, and concur with the Medical Student documentation of history, physical exam and treatment plan unless otherwise noted.     Patient seen and examined.  Reports doing okay overall.  Today less bleeding.  Has not had bowel movement.  Repeat EGD report discussed.  He denies any chest pain, nausea vomiting.  He does report some fatigue and dizziness.    Hemoglobin is 8. BUN 19. Exam stable.   Case discussed with GI. Monitor and adv diet if okay with GI stand point.     Case discussed with ncm, sw, pharmacy.     Total Time Today was 52 minutes in the following activities: Preparing to see the patient, Obtaining and/or reviewing separately obtained history, Performing a medically appropriate examination and/or evaluation, Counseling and educating the patient/family/caregiver, Ordering medications, tests, or procedures, Referring and communication with other health care professionals, Documenting clinical information, Independently interpreting results and communicating results to the patient/family/caregiver, and in Care coordination.       Staff name:  Balinda Evia, MD Date: 01/14/2024                               _______________________________________________________________________    Subjective   Glendell Fouse is a 43 y.o. male.  Patient Reports feeling well this morning. He has one episode of hematochezia yesterday after the EGD and ulcer cauterization, which was much smaller in volume than his previous episodes. He does not endorse any dizziness today, but reports increased tingling and some numbness in the tips of his fingers. Patient understood the implications of his EGD and the performed cauterization.       Medications  Scheduled Meds:gabapentin  (NEURONTIN ) capsule 300 mg, 300 mg, Oral, TID  lidocaine  (LIDODERM ) 5 % topical patch 1 patch, 1 patch, Topical, QDAY  nicotine  (NICODERM CQ ) 14 mg/day patch 1 patch, 1 patch, Transdermal, QDAY  pantoprazole  (PROTONIX ) injection 40 mg, 40 mg, Intravenous, API(88-78)  rosuvastatin  (CRESTOR ) tablet 20 mg, 20 mg, Oral, QDAY    Continuous Infusions:  PRN and Respiratory Meds:acetaminophen  Q4H PRN, cetirizine QDAY PRN, magnesium  oxide PRN **OR** magnesium  sulfate PRN (On Call from Rx), melatonin QHS PRN, ondansetron  Q6H PRN **OR** ondansetron  Q6H PRN, oxyCODONE  Q6H PRN, polyethylene glycol 3350 QDAY PRN, potassium chloride  PRN **OR** potassium chloride  PRN **OR** potassium chloride  in water PRN (On Call from Rx), prochlorperazine  edisylate Q6H PRN, sennosides-docusate sodium QDAY PRN         Objective  Vital Signs: Last Filed                 Vital Signs: 24 Hour Range   BP: 132/76 (09/25 0400)  Temp: 36.9 ?C (98.4 ?F) (09/25 0400)  Pulse: 61 (09/25 0400)  Respirations: 19 PER MINUTE (09/25 0400)  SpO2: 99 % (09/25 0400)  O2 Device: None (Room air) (09/25 0400)  O2 Liter Flow: 6 Lpm (09/24 1559) BP: (90-140)/(39-80)   Temp:  [36.4 ?C (97.6 ?F)-37.1 ?C (98.8 ?F)]   Pulse:  [51-79]   Respirations:  [8 PER MINUTE-23 PER MINUTE]   SpO2:  [92 %-100 %]   O2 Device: None (Room air)  O2 Liter Flow: 6 Lpm     Vitals:    01/10/24 2010 01/11/24 0415   Weight: 80.8 kg (178 lb 2.1 oz) 80.1 kg (176 lb 9.4 oz)         Intake/Output Summary:  (Last 24 hours)    Intake/Output Summary (Last 24 hours) at 01/14/2024 9191  Last data filed at 01/14/2024 0400  Gross per 24 hour   Intake 3033 ml   Output 2740 ml   Net 293 ml     Stool Occurrence: 1        Physical Exam  General:  Alert, cooperative, no distress, appears stated age  Lungs:  Clear to auscultation bilaterally  Chest wall:  No tenderness or deformity.  Heart:    Regular rate and rhythm, S1, S2 normal, no murmur, click rub or gallop  Abdomen:  Soft, non-tender.  Bowel sounds normal.  No masses.  No organomegaly.  Extremities:  Extremities normal, atraumatic, no cyanosis or edema  Peripheral pulses:   2+ and symmetric, all extremities    Lab Review  24-hour labs:    Results for orders placed or performed during the hospital encounter of 01/10/24 (from the past 24 hours)   POC GLUCOSE    Collection Time: 01/13/24  2:36 PM   Result Value Ref Range    Glucose, POC 82 70 - 100 mg/dL   CBC    Collection Time: 01/13/24  7:30 PM   Result Value Ref Range    White Blood Cells 6.30 4.50 - 11.00 10*3/uL    Red Blood Cells 2.80 (L) 4.40 - 5.50 10*6/uL    Hemoglobin 8.5 (L) 13.5 - 16.5 g/dL    Hematocrit 75.8 (L) 40.0 - 50.0 %    MCV 86.1 80.0 - 100.0 fL    MCH 30.5 26.0 - 34.0 pg    MCHC 35.4 32.0 - 36.0 g/dL    RDW 86.2 88.9 - 84.9 %    Platelet Count 164 150 - 400 10*3/uL    MPV 8.9 7.0 - 11.0 fL   CBC AND DIFF    Collection Time: 01/14/24  1:22 AM   Result Value Ref Range    White Blood Cells 6.50 4.50 - 11.00 10*3/uL    Red Blood Cells 2.70 (L) 4.40 - 5.50 10*6/uL    Hemoglobin 8.3 (L) 13.5 - 16.5 g/dL    Hematocrit 76.5 (L) 40.0 - 50.0 %    MCV 86.7 80.0 - 100.0 fL    MCH 30.7 26.0 - 34.0 pg    MCHC 35.4 32.0 - 36.0 g/dL    RDW 85.9 88.9 - 84.9 %    Platelet Count 165 150 - 400 10*3/uL    MPV 8.5 7.0 - 11.0 fL    Neutrophils 67.3 41.0 - 77.0 %    Lymphocytes 25.4 24.0 -  44.0 %    Monocytes 6.1 4.0 - 12.0 %    Eosinophils 0.9 0.0 - 5.0 %    Basophils 0.3 0.0 - 2.0 %    Absolute Neutrophil Count 4.40 1.80 - 7.00 10*3/uL    Absolute Lymph Count 1.60 1.00 - 4.80 10*3/uL    Absolute Monocyte Count 0.40 0.00 - 0.80 10*3/uL    Absolute Eosinophil Count 0.10 0.00 - 0.45 10*3/uL    Absolute Basophil Count 0.00 0.00 - 0.20 10*3/uL    MDW (Monocyte Distribution Width) 15.5 <=20.6   COMPREHENSIVE METABOLIC PANEL    Collection Time: 01/14/24  1:22 AM   Result Value Ref Range    Sodium 140 137 - 147 mmol/L    Potassium 3.3 (L) 3.5 - 5.1 mmol/L    Chloride 107 98 - 110 mmol/L    Glucose 87 70 - 100 mg/dL    Blood Urea Nitrogen 19 7 - 25 mg/dL    Creatinine 9.20 9.59 - 1.24 mg/dL    Calcium 8.1 (L) 8.5 - 10.6 mg/dL    Total Protein 4.7 (L) 6.0 - 8.0 g/dL    Total Bilirubin 0.6 0.2 - 1.3 mg/dL    Albumin 3.1 (L) 3.5 - 5.0 g/dL    Alk Phosphatase 26 25 - 110 U/L    AST 10 7 - 40 U/L    ALT 7 7 - 56 U/L    CO2 25 21 - 30 mmol/L    Anion Gap 8 3 - 12    Glomerular Filtration Rate (GFR) >60 >60 mL/min   CBC    Collection Time: 01/14/24  6:54 AM   Result Value Ref Range    White Blood Cells 5.90 4.50 - 11.00 10*3/uL    Red Blood Cells 2.58 (L) 4.40 - 5.50 10*6/uL    Hemoglobin 8.0 (L) 13.5 - 16.5 g/dL    Hematocrit 77.5 (L) 40.0 - 50.0 %    MCV 86.7 80.0 - 100.0 fL    MCH 31.0 26.0 - 34.0 pg    MCHC 35.7 32.0 - 36.0 g/dL    RDW 86.1 88.9 - 84.9 %    Platelet Count 169 150 - 400 10*3/uL    MPV 8.6 7.0 - 11.0 fL       Point of Care Testing  (Last 24 hours)  Glucose: 87 (01/14/24 0122)  POC Glucose (Download): 82 (01/13/24 1436)    Radiology and other Diagnostics Review:    Pertinent radiology reviewed.     Bernardino Percy   Pager

## 2024-01-15 ENCOUNTER — Encounter
Admit: 2024-01-15 | Discharge: 2024-01-15 | Payer: PRIVATE HEALTH INSURANCE | Primary: Student in an Organized Health Care Education/Training Program

## 2024-01-15 ENCOUNTER — Inpatient Hospital Stay
Admit: 2024-01-15 | Discharge: 2024-01-15 | Payer: PRIVATE HEALTH INSURANCE | Primary: Student in an Organized Health Care Education/Training Program

## 2024-01-15 LAB — PROTIME INR (PT)
~~LOC~~ BKR INR: 1.5 — ABNORMAL HIGH (ref 0.9–1.2)
~~LOC~~ BKR INR: 1.3 — ABNORMAL HIGH (ref 0.9–1.2)
~~LOC~~ BKR PROTIME: 14 s — ABNORMAL HIGH (ref 9.9–14.2)
~~LOC~~ BKR PROTIME: 16 s — ABNORMAL HIGH (ref 9.9–14.2)
~~LOC~~ BKR PROTIME: 14 s (ref 9.9–14.2)

## 2024-01-15 LAB — CBC
~~LOC~~ BKR HEMATOCRIT: 29 % — ABNORMAL LOW (ref 40.0–50.0)
~~LOC~~ BKR HEMATOCRIT: 16 % — ABNORMAL LOW (ref 40.0–50.0)
~~LOC~~ BKR HEMATOCRIT: 26 % — ABNORMAL LOW (ref 40.0–50.0)
~~LOC~~ BKR HEMOGLOBIN: 10 g/dL — ABNORMAL LOW (ref 13.5–16.5)
~~LOC~~ BKR HEMOGLOBIN: 5.8 g/dL — CL (ref 13.5–16.5)
~~LOC~~ BKR HEMOGLOBIN: 9.2 g/dL — ABNORMAL LOW (ref 13.5–16.5)
~~LOC~~ BKR MCH: 30 pg (ref 26.0–34.0)
~~LOC~~ BKR MCH: 30 pg (ref 26.0–34.0)
~~LOC~~ BKR MCH: 29 pg (ref 26.0–34.0)
~~LOC~~ BKR MCHC: 34 g/dL (ref 32.0–36.0)
~~LOC~~ BKR MCHC: 35 g/dL (ref 32.0–36.0)
~~LOC~~ BKR MCHC: 34 g/dL (ref 32.0–36.0)
~~LOC~~ BKR MCV: 87 fL (ref 80.0–100.0)
~~LOC~~ BKR MCV: 84 fL (ref 80.0–100.0)
~~LOC~~ BKR MPV: 8.8 fL (ref 7.0–11.0)
~~LOC~~ BKR MPV: 9.1 fL (ref 7.0–11.0)
~~LOC~~ BKR MPV: 8.6 fL (ref 7.0–11.0)
~~LOC~~ BKR PLATELET COUNT: 247 10*3/uL (ref 150–400)
~~LOC~~ BKR PLATELET COUNT: 167 10*3/uL (ref 150–400)
~~LOC~~ BKR PLATELET COUNT: 120 10*3/uL — ABNORMAL LOW (ref 150–400)
~~LOC~~ BKR RBC COUNT: 3.3 10*6/uL — ABNORMAL LOW (ref 4.40–5.50)
~~LOC~~ BKR RBC COUNT: 1.9 10*6/uL — ABNORMAL LOW (ref 4.40–5.50)
~~LOC~~ BKR RDW: 13 % (ref 11.0–15.0)
~~LOC~~ BKR RDW: 13 % (ref 11.0–15.0)
~~LOC~~ BKR RDW: 14 % (ref 11.0–15.0)
~~LOC~~ BKR WBC COUNT: 7.8 10*3/uL — ABNORMAL LOW (ref 4.50–11.00)
~~LOC~~ BKR WBC COUNT: 6.4 10*3/uL (ref 4.50–11.00)

## 2024-01-15 LAB — PTT (APTT)
~~LOC~~ BKR PTT: 20 s — ABNORMAL LOW (ref 24.0–36.5)
~~LOC~~ BKR PTT: 25 s (ref 24.0–36.5)
~~LOC~~ BKR PTT: 27 s (ref 24.0–36.5)

## 2024-01-15 LAB — TEG WITH KAOLIN
~~LOC~~ BKR ANGLE KAOLIN: 45 deg — ABNORMAL LOW (ref 55.0–75.0)
~~LOC~~ BKR ANGLE KAOLIN: 74 deg (ref 55.0–75.0)
~~LOC~~ BKR K KAOLIN: 4.1 min — ABNORMAL HIGH (ref 1.0–3.0)
~~LOC~~ BKR K KAOLIN: 1.3 min (ref 1.0–3.0)
~~LOC~~ BKR LYSIS30: 0 % (ref 0.0–8.0)
~~LOC~~ BKR LYSIS30: 0.1 % — ABNORMAL LOW (ref 0.0–8.0)
~~LOC~~ BKR MA KAOLIN: 54 mm (ref 50.0–70.0)
~~LOC~~ BKR MA KAOLIN: 59 mm (ref 50.0–70.0)
~~LOC~~ BKR R KAOLIN: 3.9 min (ref 3.0–9.0)
~~LOC~~ BKR R KAOLIN: 1.8 min — ABNORMAL LOW (ref 3.0–9.0)
~~LOC~~ BKR RK KAOLIN: 8 min (ref 4.0–12.0)
~~LOC~~ BKR RK KAOLIN: 3.1 min — ABNORMAL LOW (ref 4.0–12.0)

## 2024-01-15 LAB — HEMOGLOBIN & HEMATOCRIT
~~LOC~~ BKR HEMATOCRIT: 23 % — ABNORMAL LOW (ref 40.0–50.0)
~~LOC~~ BKR HEMOGLOBIN: 8.2 g/dL — ABNORMAL LOW (ref 13.5–16.5)

## 2024-01-15 LAB — POC BLOOD GAS ARTERIAL
~~LOC~~ BKR POC BASE DEF ART: 2 mmol/L — ABNORMAL LOW (ref 40–50)
~~LOC~~ BKR POC BASE DEF ART: 2 mmol/L
~~LOC~~ BKR POC BASE DEF ART: 1 mmol/L
~~LOC~~ BKR POC BICARB, ART: 23 mmol/L (ref 21–28)
~~LOC~~ BKR POC BICARB, ART: 23 mmol/L (ref 21–28)
~~LOC~~ BKR POC BICARB, ART: 23 mmol/L — ABNORMAL LOW (ref 21–28)
~~LOC~~ BKR POC CO2, ART: 38 mmHg (ref 35–45)
~~LOC~~ BKR POC CO2, ART: 37 mmHg (ref 35–45)
~~LOC~~ BKR POC CO2, ART: 35 mmHg (ref 35–45)
~~LOC~~ BKR POC O2 SAT, ART: 100 % — ABNORMAL HIGH (ref 95–99)
~~LOC~~ BKR POC O2 SAT, ART: 100 % — ABNORMAL HIGH (ref 95–99)
~~LOC~~ BKR POC O2 SAT, ART: 100 % — ABNORMAL HIGH (ref 95–99)
~~LOC~~ BKR POC O2, ART: 402 mmHg — ABNORMAL HIGH (ref 80–100)
~~LOC~~ BKR POC O2, ART: 232 mmHg — ABNORMAL HIGH (ref 80–100)
~~LOC~~ BKR POC O2, ART: 181 mmHg — ABNORMAL HIGH (ref 80–100)
~~LOC~~ BKR POC PH, ART: 7.3 (ref 7.35–7.45)
~~LOC~~ BKR POC PH, ART: 7.4 (ref 7.35–7.45)
~~LOC~~ BKR POC PH, ART: 7.4 (ref 7.35–7.45)

## 2024-01-15 LAB — COMPREHENSIVE METABOLIC PANEL
~~LOC~~ BKR ALBUMIN: 2.8 g/dL — ABNORMAL LOW (ref 3.5–5.0)
~~LOC~~ BKR ALBUMIN: 2.7 g/dL — ABNORMAL LOW (ref 3.5–5.0)
~~LOC~~ BKR ALK PHOSPHATASE: 39 U/L (ref 25–110)
~~LOC~~ BKR ALK PHOSPHATASE: 39 U/L (ref 25–110)
~~LOC~~ BKR ALT: 36 U/L (ref 7–56)
~~LOC~~ BKR ALT: 35 U/L (ref 7–56)
~~LOC~~ BKR ANION GAP: 5 (ref 3–12)
~~LOC~~ BKR ANION GAP: 7 (ref 3–12)
~~LOC~~ BKR AST: 44 U/L — ABNORMAL HIGH (ref 7–40)
~~LOC~~ BKR AST: 38 U/L (ref 7–40)
~~LOC~~ BKR BLD UREA NITROGEN: 18 mg/dL (ref 7–25)
~~LOC~~ BKR BLD UREA NITROGEN: 21 mg/dL (ref 7–25)
~~LOC~~ BKR CALCIUM: 7.4 mg/dL — ABNORMAL LOW (ref 8.5–10.6)
~~LOC~~ BKR CALCIUM: 7.7 mg/dL — ABNORMAL LOW (ref 8.5–10.6)
~~LOC~~ BKR CHLORIDE: 109 mmol/L (ref 98–110)
~~LOC~~ BKR CHLORIDE: 110 mmol/L (ref 98–110)
~~LOC~~ BKR CO2: 25 mmol/L (ref 21–30)
~~LOC~~ BKR CO2: 24 mmol/L (ref 21–30)
~~LOC~~ BKR CREATININE: 0.7 mg/dL (ref 0.40–1.24)
~~LOC~~ BKR CREATININE: 0.7 mg/dL (ref 0.40–1.24)
~~LOC~~ BKR GLOMERULAR FILTRATION RATE (GFR): >60 mL/min (ref >60)
~~LOC~~ BKR GLOMERULAR FILTRATION RATE (GFR): >60 mL/min (ref >60)
~~LOC~~ BKR GLUCOSE, RANDOM: 156 mg/dL — ABNORMAL HIGH (ref 70–100)
~~LOC~~ BKR GLUCOSE, RANDOM: 138 mg/dL — ABNORMAL HIGH (ref 70–100)
~~LOC~~ BKR POTASSIUM: 3.7 mmol/L (ref 3.5–5.1)
~~LOC~~ BKR POTASSIUM: 3.6 mmol/L (ref 3.5–5.1)
~~LOC~~ BKR SODIUM, SERUM: 139 mmol/L (ref 137–147)
~~LOC~~ BKR SODIUM, SERUM: 141 mmol/L (ref 137–147)
~~LOC~~ BKR TOTAL BILIRUBIN: 1.8 mg/dL — ABNORMAL HIGH (ref 0.2–1.3)
~~LOC~~ BKR TOTAL BILIRUBIN: 1.8 mg/dL — ABNORMAL HIGH (ref 0.2–1.3)
~~LOC~~ BKR TOTAL PROTEIN: 4.3 g/dL — ABNORMAL LOW (ref 6.0–8.0)
~~LOC~~ BKR TOTAL PROTEIN: 4.3 g/dL — ABNORMAL LOW (ref 6.0–8.0)

## 2024-01-15 LAB — GLUCOSE, RANDOM
~~LOC~~ BKR GLUCOSE, RANDOM: 189 mg/dL — ABNORMAL HIGH (ref 70–100)
~~LOC~~ BKR GLUCOSE, RANDOM: 198 mg/dL — ABNORMAL HIGH (ref 70–100)

## 2024-01-15 LAB — POC BLOOD GAS VEN
~~LOC~~ BKR POC BASE DEF, VEN: 1 mmol/L — ABNORMAL LOW (ref 40–50)
~~LOC~~ BKR POC BASE EXCESS, VEN: 2 mmol/L — ABNORMAL LOW (ref 40–50)
~~LOC~~ BKR POC BICARB, VEN: 27 mmol/L
~~LOC~~ BKR POC BICARB, VEN: 24 mmol/L
~~LOC~~ BKR POC BICARB, VEN: 22 mmol/L
~~LOC~~ BKR POC CO2, VEN: 45 mmHg (ref 36–50)
~~LOC~~ BKR POC CO2, VEN: 39 mmHg (ref 36–50)
~~LOC~~ BKR POC CO2, VEN: 49 mmHg (ref 36–50)
~~LOC~~ BKR POC O2 SAT, VEN: 66 % (ref 55–71)
~~LOC~~ BKR POC O2 SAT, VEN: 25 % — ABNORMAL LOW (ref 55–71)
~~LOC~~ BKR POC O2, VEN: 35 mmHg — ABNORMAL LOW (ref 33–48)
~~LOC~~ BKR POC O2, VEN: 17 mmHg — ABNORMAL LOW (ref 33–48)
~~LOC~~ BKR POC O2, VEN: 44 mmHg (ref 33–48)
~~LOC~~ BKR POC PH, VEN: 7.3 (ref 7.30–7.40)
~~LOC~~ BKR POC PH, VEN: 7.4 (ref 7.30–7.40)
~~LOC~~ BKR POC PH, VEN: 7.2 — ABNORMAL LOW (ref 7.30–7.40)

## 2024-01-15 LAB — CBC AND DIFF
~~LOC~~ BKR MCH: 30 pg — ABNORMAL LOW (ref 26.0–34.0)
~~LOC~~ BKR MCHC: 34 g/dL — ABNORMAL LOW (ref 32.0–36.0)
~~LOC~~ BKR PLATELET COUNT: 218 10*3/uL — ABNORMAL LOW (ref 150–400)

## 2024-01-15 LAB — POC LACTATE
~~LOC~~ BKR POC LACTIC ACID: 0.6 mmol/L (ref 0.5–2.0)
~~LOC~~ BKR POC LACTIC ACID: 0.8 mmol/L (ref 0.5–2.0)

## 2024-01-15 LAB — D-DIMER
~~LOC~~ BKR D-DIMER: 371 ng{FEU}/mL (ref <=500)
~~LOC~~ BKR D-DIMER: 663 ng{FEU}/mL — ABNORMAL HIGH (ref <=500)
~~LOC~~ BKR D-DIMER: 157 ng{FEU}/mL — ABNORMAL HIGH (ref <=500)

## 2024-01-15 LAB — POC GLUCOSE
~~LOC~~ BKR POC GLUCOSE: 105 mg/dL — ABNORMAL HIGH (ref 70–100)
~~LOC~~ BKR POC GLUCOSE: 182 mg/dL — ABNORMAL HIGH (ref 70–100)
~~LOC~~ BKR POC GLUCOSE: 147 mg/dL — ABNORMAL HIGH (ref 70–100)
~~LOC~~ BKR POC GLUCOSE: 161 mg/dL — ABNORMAL HIGH (ref 70–100)

## 2024-01-15 LAB — SODIUM,BG: SODIUM BLOOD GAS: 139 mmol/L (ref 137–147)

## 2024-01-15 LAB — POC SODIUM
~~LOC~~ BKR POC SODIUM: 143 mmol/L (ref 137–147)
~~LOC~~ BKR POC SODIUM: 140 mmol/L (ref 137–147)
~~LOC~~ BKR POC SODIUM: 139 mmol/L (ref 137–147)
~~LOC~~ BKR POC SODIUM: 140 mmol/L (ref 137–147)

## 2024-01-15 LAB — LACTIC ACID (BG - RAPID LACTATE): ~~LOC~~ BKR LACTIC ACID(SYRINGE): 1.4 mmol/L (ref 0.5–2.0)

## 2024-01-15 LAB — FIBRINOGEN
~~LOC~~ BKR FIBRINOGEN: 136 mg/dL — ABNORMAL LOW (ref 200–400)
~~LOC~~ BKR FIBRINOGEN: 151 mg/dL — ABNORMAL LOW (ref 200–400)
~~LOC~~ BKR FIBRINOGEN: 179 mg/dL — ABNORMAL LOW (ref 200–400)

## 2024-01-15 LAB — POC POTASSIUM
~~LOC~~ BKR POC POTASSIUM: 3.7 mmol/L (ref 3.5–5.1)
~~LOC~~ BKR POC POTASSIUM: 4 mmol/L (ref 3.5–5.1)
~~LOC~~ BKR POC POTASSIUM: 5.9 mmol/L — ABNORMAL HIGH (ref 3.5–5.1)
~~LOC~~ BKR POC POTASSIUM: 5.4 mmol/L — ABNORMAL HIGH (ref 3.5–5.1)
~~LOC~~ BKR POC POTASSIUM: 4.4 mmol/L (ref 3.5–5.1)
~~LOC~~ BKR POC POTASSIUM: 3.7 mmol/L (ref 3.5–5.1)

## 2024-01-15 LAB — POC HEMATOCRIT&HEMOGLOBIN
~~LOC~~ BKR POC HEMATOCRIT: 27 % — ABNORMAL LOW (ref 40–50)
~~LOC~~ BKR POC HEMATOCRIT: 25 % — ABNORMAL LOW (ref 40–50)
~~LOC~~ BKR POC HEMOGLOBIN: 9.2 g/dL — ABNORMAL LOW (ref 13.5–16.5)
~~LOC~~ BKR POC HEMOGLOBIN: 8.5 g/dL — ABNORMAL LOW (ref 13.5–16.5)

## 2024-01-15 LAB — BLOOD GASES, ARTERIAL
~~LOC~~ BKR BICARB, ART(CAL): 24 mmol/L — ABNORMAL HIGH (ref 21.0–28.0)
~~LOC~~ BKR FIO2 VALUE-ART: 40 % (ref 1.00–1.30)
~~LOC~~ BKR O2 SAT-ART: 98 % — ABNORMAL LOW (ref 95.0–99.0)
~~LOC~~ BKR PH-ART: 7.4 — AB (ref 7.35–7.45)

## 2024-01-15 LAB — PHOSPHORUS: ~~LOC~~ BKR PHOSPHORUS: 3.8 mg/dL (ref 2.0–4.5)

## 2024-01-15 LAB — MAGNESIUM: ~~LOC~~ BKR MAGNESIUM: 1.6 mg/dL (ref 1.6–2.6)

## 2024-01-15 LAB — IONIZED CALCIUM
~~LOC~~ BKR IONIZED CALCIUM: 0.9 mmol/L — ABNORMAL LOW (ref 1.00–1.30)
~~LOC~~ BKR IONIZED CALCIUM: 1 mmol/L (ref 1.00–1.30)

## 2024-01-15 LAB — HEMOGLOBIN & HEMATOCRIT, BG
~~LOC~~ BKR HEMATOCRIT BLOOD GAS: 28 % — ABNORMAL LOW (ref 40.0–50.0)
~~LOC~~ BKR HEMOGLOBIN BLOOD GAS: 9 g/dL — ABNORMAL LOW (ref 13.5–16.5)

## 2024-01-15 LAB — POTASSIUM: ~~LOC~~ BKR POTASSIUM: 5.3 mmol/L — ABNORMAL HIGH (ref 3.5–5.1)

## 2024-01-15 LAB — PLATELET COUNT: ~~LOC~~ BKR PLATELET COUNT: 178 10*3/uL (ref 150–400)

## 2024-01-15 MED ORDER — NALOXONE 0.4 MG/ML IJ SOLN
.08 mg | INTRAVENOUS | 0 refills | Status: DC | PRN
Start: 2024-01-15 — End: 2024-01-18

## 2024-01-15 MED ORDER — CALCIUM GLUC IN NACL, ISO-OSM 1 GRAM/50 ML IV SOLN
1 g | Freq: Once | INTRAVENOUS | 0 refills | Status: CP
Start: 2024-01-15 — End: ?
  Administered 2024-01-15: 18:00:00 1 g via INTRAVENOUS

## 2024-01-15 MED ORDER — PROPOFOL 10 MG/ML IV EMUL
5-100 ug/kg/min | INTRAVENOUS | 0 refills | Status: DC
Start: 2024-01-15 — End: 2024-01-16
  Administered 2024-01-15: 90 ug/kg/min via INTRAVENOUS
  Administered 2024-01-16 (×4): 100 ug/kg/min via INTRAVENOUS

## 2024-01-15 MED ORDER — ALUM-MAG HYDROXIDE-SIMETH 200-200-20 MG/5 ML PO SUSP
30 mL | Freq: Once | ORAL | 0 refills | Status: CP
Start: 2024-01-15 — End: ?
  Administered 2024-01-15: 05:00:00 30 mL via ORAL

## 2024-01-15 MED ORDER — SODIUM CHLORIDE 0.9% IV SOLP
Freq: Once | INTRAVENOUS | 0 refills | Status: CP
Start: 2024-01-15 — End: ?

## 2024-01-15 MED ORDER — FENTANYL CITRATE (PF) 50 MCG/ML IJ SOLN
100 ug | Freq: Once | INTRAVENOUS | 0 refills | Status: CP
Start: 2024-01-15 — End: ?

## 2024-01-15 MED ORDER — MIDAZOLAM 1 MG/ML IJ SOLN
2 mg | Freq: Once | INTRAVENOUS | 0 refills | Status: CP
Start: 2024-01-15 — End: ?

## 2024-01-15 MED ORDER — WHITE PETROLATUM-MINERAL OIL 57.7-31.9 % OP OINT
.25 [in_us] | OPHTHALMIC | 0 refills | Status: DC
Start: 2024-01-15 — End: 2024-01-16
  Administered 2024-01-15: 23:00:00 0.25 [in_us] via OPHTHALMIC

## 2024-01-15 MED ORDER — POTASSIUM CHLORIDE IN WATER 10 MEQ/50 ML IV PGBK
10 meq | INTRAVENOUS | 0 refills | Status: CP
Start: 2024-01-15 — End: ?
  Administered 2024-01-16: 02:00:00 10 meq via INTRAVENOUS

## 2024-01-15 MED ORDER — NOREPINEPHRINE BITARTRATE-D5W 4 MG/250 ML (16 MCG/ML) IV SOLN
0-.5 ug/kg/min | INTRAVENOUS | 0 refills | Status: DC
Start: 2024-01-15 — End: 2024-01-15
  Administered 2024-01-15: 17:00:00 0.05 ug/kg/min via INTRAVENOUS

## 2024-01-15 MED ORDER — POTASSIUM CHLORIDE IN WATER 10 MEQ/50 ML IV PGBK
10 meq | INTRAVENOUS | 0 refills | Status: CP
Start: 2024-01-15 — End: ?
  Administered 2024-01-15: 23:00:00 10 meq via INTRAVENOUS

## 2024-01-15 MED ORDER — FENTANYL DRIP IN NS 1000MCG/100ML
10-100 ug/h | INTRAVENOUS | 0 refills | Status: DC
Start: 2024-01-15 — End: 2024-01-16

## 2024-01-15 MED ORDER — DEXMEDETOMIDINE IN 0.9 % NACL 400 MCG/100 ML (4 MCG/ML) IV SOLN
.2-1.5 ug/kg/h | INTRAVENOUS | 0 refills | Status: DC
Start: 2024-01-15 — End: 2024-01-16

## 2024-01-15 MED ORDER — VASOPRESSIN IN 0.9 % SOD CHLOR 20 UNIT/100 ML (0.2 UNIT/ML) IV SOLN
1.8 [IU]/h | INTRAVENOUS | 0 refills | Status: DC
Start: 2024-01-15 — End: 2024-01-15
  Administered 2024-01-15: 18:00:00 1.8 [IU]/h via INTRAVENOUS

## 2024-01-15 MED ORDER — LACTATED RINGERS IV BOLUS
1000 mL | Freq: Once | INTRAVENOUS | 0 refills | Status: CP
Start: 2024-01-15 — End: ?

## 2024-01-15 MED ORDER — SUCCINYLCHOLINE CHORIDE (PF) 20 MG/ML IJ GROUP
120 mg | Freq: Once | INTRAVENOUS | 0 refills | Status: CP
Start: 2024-01-15 — End: ?

## 2024-01-15 MED ORDER — LACTATED RINGERS IV SOLP
INTRAVENOUS | 0 refills | Status: AC
Start: 2024-01-15 — End: ?
  Administered 2024-01-15: 06:00:00 1000.0000 mL via INTRAVENOUS

## 2024-01-15 MED ORDER — FENTANYL (SUBLIMAZE) BOLUS FOR CONTINUOUS INFUSION
25 ug | INTRAVENOUS | 0 refills | Status: DC | PRN
Start: 2024-01-15 — End: 2024-01-16

## 2024-01-15 MED ORDER — ALUMINUM-MAGNESIUM HYDROXIDE 200-200 MG/5 ML PO SUSP
30 mL | ORAL | 0 refills | Status: DC | PRN
Start: 2024-01-15 — End: 2024-01-19

## 2024-01-15 MED ORDER — POTASSIUM CHLORIDE IN WATER 10 MEQ/50 ML IV PGBK
10 meq | INTRAVENOUS | 0 refills | Status: CP
Start: 2024-01-15 — End: ?
  Administered 2024-01-16: 06:00:00 10 meq via INTRAVENOUS

## 2024-01-15 MED ORDER — POTASSIUM CHLORIDE IN WATER 10 MEQ/50 ML IV PGBK
10 meq | INTRAVENOUS | 0 refills | Status: CP
Start: 2024-01-15 — End: ?
  Administered 2024-01-16: 04:00:00 10 meq via INTRAVENOUS

## 2024-01-15 MED ORDER — ETOMIDATE 2 MG/ML IV SOLN
20 mg | Freq: Once | INTRAVENOUS | 0 refills | Status: CP
Start: 2024-01-15 — End: ?
  Administered 2024-01-15: 17:00:00 20 mg via INTRAVENOUS

## 2024-01-15 MED ORDER — IOHEXOL 300 MG IODINE/ML IV SOLN
220 mL | Freq: Once | INTRA_ARTERIAL | 0 refills | Status: CP
Start: 2024-01-15 — End: ?
  Administered 2024-01-15: 21:00:00 220 mL via INTRA_ARTERIAL

## 2024-01-15 MED ORDER — MAGNESIUM SULFATE IN D5W 1 GRAM/100 ML IV PGBK
1 g | INTRAVENOUS | 0 refills | Status: CP
Start: 2024-01-15 — End: ?
  Administered 2024-01-16: 01:00:00 1 g via INTRAVENOUS

## 2024-01-15 MED ORDER — LIDOCAINE HCL 2 % MM SOLN
15 mL | Freq: Once | ORAL | 0 refills | Status: CP
Start: 2024-01-15 — End: ?
  Administered 2024-01-15: 05:00:00 15 mL via ORAL

## 2024-01-15 MED ADMIN — LACTATED RINGERS IV SOLP [4318]: 1000 mL | INTRAVENOUS | @ 05:00:00 | Stop: 2024-01-15 | NDC 00338011704

## 2024-01-15 MED ADMIN — FENTANYL DRIP IN NS 1000MCG/100ML [30807]: 100 ug/h | INTRAVENOUS | @ 17:00:00 | Stop: 2024-01-15 | NDC 63037010005

## 2024-01-15 MED ADMIN — FENTANYL CITRATE (PF) 50 MCG/ML IJ SOLN [3037]: 100 ug | INTRAVENOUS | @ 17:00:00 | Stop: 2024-01-15 | NDC 00641602701

## 2024-01-15 MED ADMIN — SODIUM CHLORIDE 0.9% IV SOLP [27838]: INTRAVENOUS | @ 16:00:00 | Stop: 2024-01-15 | NDC 00338004904

## 2024-01-15 MED ADMIN — MIDAZOLAM 1 MG/ML IJ SOLN [10607]: 2 mg | INTRAVENOUS | @ 17:00:00 | Stop: 2024-01-15 | NDC 23155060031

## 2024-01-15 MED ADMIN — PROPOFOL 10 MG/ML IV EMUL [11150]: 70 ug/kg/min | INTRAVENOUS | @ 17:00:00 | Stop: 2024-01-15 | NDC 63323026935

## 2024-01-15 NOTE — Consults
 GI re-consulted for large volume hematemesis. S/p endoscopic evaluation 9/23 and 9/24 which revealed one superficial esophageal ulcer w/ no stigmata of recent bleeding at the GE junction. GJ anastomosis w/ ulceration on the jejunal side. There were a few flat red spots and a non-pulsating bulge w/o obvious vessel. Friability along the borders, but no active bleeding. Coagulation for bleeding prevention using bipolar probe was successful and Nexpowder then applied.     Discussed w/ IR and plan for embolization.    Carlin JINNY Saxon, MD  Gastroenterology & Hepatology Fellow, PGY-5  [phone]

## 2024-01-15 NOTE — Response Teams
 Rapid Response Team Progress Note    Date: 01/15/2024 Time: 1:49 PM  Patient: Danny Kidd  Attending: Evia Smothers, MD Service: Med ICU 4 [phone]  Admission Date: 01/10/2024  LOS: 5 days    A Code/Rapid Response Timeline Event Report has been created for this patient on 01/15/24 at 1136.    The Attending physician, Dr. Evia Smothers, was at the bedside for this event.     Patient rapid responded for hypotension after becoming unresponsive while on the commode with RN. Patient med/surg status on Ca5 ICU in room 5127. Translator in use to assist with the language barrier, patient was oriented x4, complaining of abdominal cramping, nausea, and dizziness. HR 96, RR 26, satting 98% on RA, BP 91/43. Placed in trendelenberg position and received 2 units of emergent RBCs. Dr. Hermenia with NEICU at bedside. MICU notified, Dr. Omega Awkward at bedside. Patient vomiting blood, MTP activated at 1157 per Dr. Hermenia. Pamala ran by Leita Bamberger, RN and Adelina Banner, RN. Patient intubated by Dr. Mancel Drought. IR Anesthesiologist, Dr. Bonner, at bedside to take patient to IR emergently. Labs and CXR obtained. Patient started on multiple drips, see MAR for medication details. Patient will go to MICU room [id number] post-IR.    Tinnie Kenner, RN

## 2024-01-15 NOTE — Unmapped
 Immediate Post Procedure Note    Date:  01/15/2024                                         Attending Physician:   Dr. Jacquie  Performing Provider:  Marolyn Pickler, MD    Consent:  Consent obtained from family.  Time out performed: Consent obtained, correct patient verified, correct procedure verified, correct site verified, patient marked as necessary.  Pre/Post Procedure Diagnosis:  Acute blood loss anemia  Indications:  As above      Procedure(s):  US  and fluoro guided mesenteric angiogram with intervention    Findings:      Irregularity and pseudoaneurysmal degeneration of an area of the left and right gastric artery anastomosis, successfully coil embolized from both sides.     A distal branch of the left gastric was coiled to hemostasis and the entire right gastric artery was coiled to hemostasis.    No other significantly irregular vessel or active extravasation identified.          Estimated Blood Loss:  None/Negligible  Specimen(s) Removed/Disposition:  None  Complications: None  Patient Tolerated Procedure: Well  Post-Procedure Condition:  stable    Danny Pickler, MD

## 2024-01-15 NOTE — Progress Notes
 Adult Mechanical Ventilator Liberation    Name: Danny Kidd   MRN: [id number]     DOB: 05-Apr-1981      Age: 43 y.o.  Admission Date: 01/10/2024     LOS: 5 days     Date of Service: 01/15/2024        Adult Mechanical Ventilator Liberation: Twice daily     Weaning Readiness Screen Met (RT Only):: No, patient failed SAT and RASS is >1 or <-2  SAT Safety Screen (Nursing Only): Exempt-Other (Comment) (IR)

## 2024-01-15 NOTE — Progress Notes
 Internal Medicine Daily Progress Note       Name:  Danny Kidd   Sep 23, 1980  01/15/2024  Admission Date: 01/10/2024  LOS: 5 days                          Assessment and Plan       Rapid response:     Patient seen during morning rounds.  EN M visit performed with help of an interpreter ID [id number].  Patient reported that he had multiple episodes of bowel movement bloody overnight.  He had a vasovagal episode earlier today morning in which he had a syncope.  Vitals remained stable per nursing.  On my evaluation he appeared to be in acute distress with tachycardia heart rates into 100.  This is a sharp change from his prior.  His blood pressure was downtrending around 106/58 mmHg.  Patient reported not feeling too good.  Denies any dizziness but did have some nausea.  On exam he was alert and oriented x 3, however had some abdominal pain and tenderness.  Tachycardic heart rate 96, respiratory rate 26, normal saturation on room air.  Blood pressure 106/50 mmHg.  I activated a rapid response immediately secondary to concern for acute GI hemorrhage.  Of note prior to this I had reached out to interventional radiology and gastroenterology.  Gastroenterology fellow reported that likely they will not be able to intervene and wanted IR evaluation.  Per IR they recommended a CTA abdomen pelvis.  During rapid response I discussed the case with multiple critical care providers including neuro ICU and medical ICU.  We activated a massive transfusion protocol.  He received 2 units of packed RBCs and his hemoglobin improved only 5.8.  During rapid response he had a massive bright red blood hematemesis.  He was laid in Trendelenburg.  His heart rate increased up to 120 he became hypotensive with MAP around 51.  Patient was intubated and sedated at bedside.  He had a central line placed at bedside.  Medical ICU was contacted discussed.  GI fellow contacted and discussed.  Patient will be transferred to the medical ICU. I called emergency contact [name] but no answer.         43 y.o. 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  -CT angiogram abdomen/pelvis 01/10/2024: Prior bowel [place] gastric bypass with fat stranding between the gastrojejunostomy and anterior aspect of the pancreatic body jejunum adjacent to gastrojejunostomy anastomotic site.  There is no intraperitoneal fluid collection, significant ascites, or pneumoperitoneum.   -CT w oral contrast abdomen/pelvis: Prior Roux-en-Y gastric bypass, with similar ill-defined stranding   between the stomach and the pancreas. No extraluminal contrast extravasation of the administration oral   contrast to suggest perforation. No pneumoperitoneum or ascites.     - CXR: stable mild cardiomegaly  - Hgb 8.0  - Received one unit PRBCs 9/21, 2 more units received 9/24, repeat hgb 8.2, will transfuse as needed for hgb<8 or symptomatic anemia   - BUN elevated at 33   - Lactic  acid within normal limits at 1.1   - Soft, nondistended, nontender to palpation without evidence of  peritonitis on exam  - GI consulted  - EGD 9/23: One superficial esophageal ulcer with no bleeding was found 41cm from the incisors. The lesion was 3mm in largest dimesnion. 1ch hiatal hernia was present. 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 Biopsies were taken via cold forcepts and sent to pathology.  - Surgery consulted   - BID IV protonix  40mg   - regular diet  - DVT prophylaxis held  - CBC every 6 hours  - CT abd/pelv 9/23: No evidence of active gastrointestinal bleeding. Prior Roux-en-Y gastric bypass. The previously described apparent defect near the gastrojejunal anastomosis as well as apparent edema between the stomach and pancreatic body are not confidently identified on the current examination.   - EGD 9/24: Evidence of a Roux-en-Y gastrojejunostomy was found. The gastrojejunal anastomosis was characterized by ulceration on the jejunal side. Positioning was difficult with continued slippage. Few flat red spots were seen. Another non-pulsating bulge was also evaluated without obvious vessel. Second endoscopist also viewed this area. Friability along the borders, but no active bleeding. Coagulation for bleeding prevention using bipolar probe was successful and Nexpowder then applied.    - GI recs: resume diet, omeprazole BID 8wks, conside sucralate slurry for 2 wks, avoid NSAIDs, smoking cessation, repeat EGD 8-12 weeks.     HFpEF, not in acute exacerbation  Hypertension  Tobacco Use   - CXR 9/21/2: Stable mild cardiomegaly without abnormality  -TTE 12/26/2022: Moderately dilated left ventricle ejection fraction of 50%.  Indeterminate left ventricular diastolic dysfunction.  40 mmHg.  CVP 10 to 20 mmHg.  - PTA diuresis: Lasix  40 mg daily  - PTA GDMT: Losartan  100 mg daily metoprolol  succinate 100 mg daily  - Will hold home meds in setting of acute bleed   - nicotine  patch prn     DM2  - A1C: 8.5 02/19/2024  - Patient not taking metformin  as per endocrinologist a few months ago, pt was taking metformin  1000mg  BID prior  - 110lb wt loss since roux en Y 03/2023  - Repeat A1C outpatient     Leukocytosis - resolved  - Mild elevated WBC on admission to 13.9, most likely reactive  - WBC 5.9 today   - Blood cultures ordered, preliminary result no growth at 24 hours             Active Wounds                            Wounds Blister (Not pressure) Anterior;Right;Lower Leg (Active)   01/14/24 1600 Wound Type: Blister (Not pressure)   Orientation: Anterior;Right;Lower   Location: Leg   Wound Location Comments:    Initial Wound Site Closure:    Initial Dressing Placed:    Initial Cycle:    Initial Suction Setting (mmHg):    Pressure Injury Stages:    Pressure Injury Present Within 24 Hours of Hospital Admission:    If This Pressure Injury Is Suspected to Be Device Related, Please Select the Device::    Is the Wound Open or Closed:    Wound Assessment Scab;Dry 01/15/24 0800   Peri-wound Assessment Purple;Intact 01/15/24 0800   Wound Drainage Amount None 01/15/24 0800   Wound Dressing Status None/open to air 01/15/24 0800   Wound Care Treatment or ointment applied  01/14/24 1600   Wound Dressing and/or Treatment A & D ointment 01/14/24 1600   Number of days: 1       Wounds Blister (Not pressure) Anterior;Left;Lower Leg (Active)   01/14/24 1600   Wound Type: Blister (Not pressure)   Orientation: Anterior;Left;Lower   Location: Leg   Wound Location Comments:    Initial Wound Site Closure:    Initial Dressing Placed:    Initial Cycle:    Initial Suction Setting (mmHg):    Pressure Injury Stages:    Pressure Injury Present Within 24 Hours of Hospital Admission:    If This Pressure Injury Is Suspected to Be Device Related, Please Select the Device::    Is the Wound Open or Closed:    Wound Assessment Scab;Dry 01/15/24 0800   Peri-wound Assessment Purple;Intact 01/15/24 0800   Wound Drainage Amount None 01/15/24 0800   Wound Dressing Status None/open to air 01/15/24 0800   Wound Care Treatment or ointment applied 01/14/24 1600   Wound Dressing and/or Treatment A & D ointment 01/14/24 1600   Number of days: 1            Full code.   npo    Disposition: transfer to ICU.       > 35 minutes spent at bedside in delivery of critical care including ordering labs, medications, evaluating patient.      Balinda Maxon M.D., FACP  Pager [phone]  ________________________________________________________________________    Subjective    Danny Kidd is a 43 y.o. male.  Patient seen and examned. See above .      Objective                       Vital Signs: Last Filed                 Vital Signs: 24 Hour Range   BP: 140/111 (09/26 1214)  Temp: 36.9 ?C (98.4 ?F) (09/26 1200)  Pulse: 103 (09/26 1214)  Respirations: 16 PER MINUTE (09/26 1214)  SpO2: 100 % (09/26 1214)  O2%: 100 % (09/26 1214)  O2 Device: Ventilator (09/26 1214) BP: (79-145)/(37-111)   Temp:  [36.4 ?C (97.6 ?F)-36.9 ?C (98.5 ?F)]   Pulse:  [64-121]   Respirations:  [12 PER MINUTE-26 PER MINUTE]   SpO2:  [92 %-100 %]   O2%:  [100 %]   O2 Device: Ventilator         Intake/Output Summary:  (Last 24 hours)    Intake/Output Summary (Last 24 hours) at 01/15/2024 1218  Last data filed at 01/15/2024 1130  Gross per 24 hour   Intake 2725 ml   Output 400 ml   Net 2325 ml       Physical Exam  Physical Exam  Constitutional:       General: He is in acute distress.      Appearance: He is ill-appearing.   Cardiovascular:      Rate and Rhythm: Regular rhythm. Tachycardia present.   Pulmonary:      Effort: No respiratory distress.      Breath sounds: No stridor.   Abdominal:      General: There is no distension.      Tenderness: There is no abdominal tenderness.   Musculoskeletal:         General: No swelling or tenderness.   Neurological:      Mental Status: He is alert and oriented to person, place, and time.  Medications  Scheduled IV PRN   gabapentin  (NEURONTIN ) capsule 300 mg, 300 mg, Oral, TID  lidocaine  (LIDODERM ) 5 % topical patch 1 patch, 1 patch, Topical, QDAY  nicotine  (NICODERM CQ ) 14 mg/day patch 1 patch, 1 patch, Transdermal, QDAY  pantoprazole  (PROTONIX ) injection 40 mg, 40 mg, Intravenous, API(88-78)  rosuvastatin  (CRESTOR ) tablet 20 mg, 20 mg, Oral, QDAY  SODIUM CHLORIDE  0.9% IV SOLP (Cabinet Override), , , NOW  SODIUM CHLORIDE  0.9% IV SOLP (Cabinet Override), , , NOW  succinylcholine  chloride(PF) injection 120 mg, 120 mg, Intravenous, ONCE      norepinephrine (LEVOPHED) 4 mg in dextrose 5% (D5W) 250 mL IV drip (std conc) 0.03 mcg/kg/min (01/15/24 1214)    acetaminophen  Q4H PRN, aluminum-magnesium  hydroxide Q4H PRN, cetirizine QDAY PRN, magnesium  oxide PRN **OR** magnesium  sulfate PRN (On Call from Rx), melatonin QHS PRN, ondansetron  Q6H PRN **OR** ondansetron  Q6H PRN, oxyCODONE  Q6H PRN, polyethylene glycol 3350 QDAY PRN, prochlorperazine  edisylate Q6H PRN, sennosides-docusate sodium QDAY PRN       Lab Review  24-hour labs:    Results for orders placed or performed during the hospital encounter of 01/10/24 (from the past 24 hours)   CBC    Collection Time: 01/14/24  3:47 PM   Result Value Ref Range    White Blood Cells 6.80 4.50 - 11.00 10*3/uL    Red Blood Cells 3.03 (L) 4.40 - 5.50 10*6/uL    Hemoglobin 9.2 (L) 13.5 - 16.5 g/dL    Hematocrit 73.7 (L) 40.0 - 50.0 %    MCV 86.3 80.0 - 100.0 fL    MCH 30.3 26.0 - 34.0 pg    MCHC 35.2 32.0 - 36.0 g/dL    RDW 86.1 88.9 - 84.9 %    Platelet Count 195 150 - 400 10*3/uL    MPV 8.9 7.0 - 11.0 fL   CBC    Collection Time: 01/14/24  9:09 PM   Result Value Ref Range    White Blood Cells 7.80 4.50 - 11.00 10*3/uL    Red Blood Cells 3.37 (L) 4.40 - 5.50 10*6/uL    Hemoglobin 10.2 (L) 13.5 - 16.5 g/dL    Hematocrit 70.5 (L) 40.0 - 50.0 %    MCV 87.4 80.0 - 100.0 fL    MCH 30.3 26.0 - 34.0 pg    MCHC 34.7 32.0 - 36.0 g/dL    RDW 86.0 88.9 - 84.9 %    Platelet Count 247 150 - 400 10*3/uL    MPV 8.8 7.0 - 11.0 fL   CBC AND DIFF    Collection Time: 01/15/24 12:10 AM   Result Value Ref Range    White Blood Cells 6.20 4.50 - 11.00 10*3/uL    Red Blood Cells 2.54 (L) 4.40 - 5.50 10*6/uL    Hemoglobin 7.8 (L) 13.5 - 16.5 g/dL    Hematocrit 77.4 (L) 40.0 - 50.0 %    MCV 88.8 80.0 - 100.0 fL    MCH 30.7 26.0 - 34.0 pg    MCHC 34.6 32.0 - 36.0 g/dL    RDW 86.3 88.9 - 84.9 %    Platelet Count 218 150 - 400 10*3/uL    MPV 8.8 7.0 - 11.0 fL Neutrophils 71.3 41.0 - 77.0 %    Lymphocytes 20.7 (L) 24.0 - 44.0 %    Monocytes 6.2 4.0 - 12.0 %    Eosinophils 1.6 0.0 - 5.0 %    Basophils 0.2 0.0 - 2.0 %    Absolute Neutrophil Count 4.40 1.80 - 7.00  10*3/uL    Absolute Lymph Count 1.30 1.00 - 4.80 10*3/uL    Absolute Monocyte Count 0.40 0.00 - 0.80 10*3/uL    Absolute Eosinophil Count 0.10 0.00 - 0.45 10*3/uL    Absolute Basophil Count 0.00 0.00 - 0.20 10*3/uL    MDW (Monocyte Distribution Width) 19.0 <=20.6   BASIC METABOLIC PANEL    Collection Time: 01/15/24 12:10 AM   Result Value Ref Range    Sodium 140 137 - 147 mmol/L    Potassium 3.7 3.5 - 5.1 mmol/L    Chloride 106 98 - 110 mmol/L    Glucose 131 (H) 70 - 100 mg/dL    Blood Urea Nitrogen 11 7 - 25 mg/dL    Creatinine 9.12 9.59 - 1.24 mg/dL    Calcium 8.6 8.5 - 89.3 mg/dL    CO2 28 21 - 30 mmol/L    Anion Gap 6 3 - 12    Glomerular Filtration Rate (GFR) >60 >60 mL/min   TYPE & CROSSMATCH    Collection Time: 01/15/24 12:10 AM   Result Value Ref Range    ABO/RH(D) A POS     Antibody Screen NEG     Crossmatch Expires 01/18/2024,2359     Units Ordered 52     Electronic Crossmatch YES     Record Check FOUND    POC LACTATE    Collection Time: 01/15/24 12:10 AM   Result Value Ref Range    LACTIC ACID POC 0.6 0.5 - 2.0 mmol/L   PREPARE RBC - SUNQUEST    Collection Time: 01/15/24 12:10 AM   Result Value Ref Range    Units Ordered 52     Crossmatch Expires 01/18/2024,2359     Record Check FOUND     ABO/RH(D) A POS     Antibody Screen NEG     Electronic Crossmatch YES     Unit Number [id number]     Blood Component Type RBC,ADSOL,LEUKO REDUCED     Unit Division 00     Status OF Unit ISSUED     ISSUE DATE TIME 797490739384     PRODUCT CODE [id number]     BLOOD TYPE A POS     CODING STATUS 6200     BLOOD EXPIRATION DATE 797489937640     Transfusion Status OK TO TRANSFUSE     Crossmatch Result COMPATIBLE,ELECTRONIC     Unit Number [id number]     Blood Component Type RBC,ADSOL,LEUKO REDUCED     Unit Division 00 Status OF Unit ISSUED     ISSUE DATE TIME 797490739080     PRODUCT CODE [id number]     BLOOD TYPE A POS     CODING STATUS 6200     BLOOD EXPIRATION DATE 797489967640     Transfusion Status OK TO TRANSFUSE     Crossmatch Result COMPATIBLE,ELECTRONIC     Unit Number [id number]     Blood Component Type RBC,ADSOL,LEUKO REDUCED     Unit Division 00     Status OF Unit ISSUED     ISSUE DATE TIME 797490738853     PRODUCT CODE [id number]     BLOOD TYPE A POS     CODING STATUS 6200     BLOOD EXPIRATION DATE 797489797640     Transfusion Status OK TO TRANSFUSE     Crossmatch Result COMPATIBLE,ELECTRONIC     Unit Number [id number]     Blood Component Type RBC,ADSOL,LEUKO REDUCED     Unit Division 00     Status OF Unit ISSUED  ISSUE DATE TIME 797490738844     PRODUCT CODE E0336V00     BLOOD TYPE O POS     CODING STATUS 5100     BLOOD EXPIRATION DATE 797489837640     Transfusion Status OK TO TRANSFUSE     Crossmatch Result COMPATIBLE,ELECTRONIC     Unit Number [id number]     Blood Component Type RBC,ADSOL,LEUKO REDUCED     Unit Division 00     Status OF Unit ISSUED     ISSUE DATE TIME 797490738844     PRODUCT CODE E0424V00     BLOOD TYPE O POS     CODING STATUS 5100     BLOOD EXPIRATION DATE 797489837640     Transfusion Status OK TO TRANSFUSE     Crossmatch Result COMPATIBLE,ELECTRONIC     Unit Number [id number]     Blood Component Type RBC,ADSOL,LEUKO REDUCED     Unit Division 00     Status OF Unit ISSUED     ISSUE DATE TIME 797490738844     PRODUCT CODE E0336V00     BLOOD TYPE O POS     CODING STATUS 5100     BLOOD EXPIRATION DATE 797489827640     Transfusion Status OK TO TRANSFUSE     Crossmatch Result COMPATIBLE,ELECTRONIC     Unit Number [id number]     Blood Component Type RBC,ADSOL,LEUKO REDUCED     Unit Division 00     Status OF Unit ISSUED     ISSUE DATE TIME 797490738844     PRODUCT CODE E0336V00     BLOOD TYPE O POS     CODING STATUS 5100     BLOOD EXPIRATION DATE 797489837640     Transfusion Status OK TO TRANSFUSE     Crossmatch Result COMPATIBLE,ELECTRONIC     Unit Number [id number]     Blood Component Type RBC,ADSOL,LEUKO REDUCED     Unit Division 00     Status OF Unit ISSUED     ISSUE DATE TIME 797490738844     PRODUCT CODE E0336V00     BLOOD TYPE O POS     CODING STATUS 5100     BLOOD EXPIRATION DATE 797489827640     Transfusion Status OK TO TRANSFUSE     Crossmatch Result COMPATIBLE,ELECTRONIC     Unit Number [id number]     Blood Component Type RBC,ADSOL,LEUKO REDUCED     Unit Division 00     Status OF Unit ISSUED     ISSUE DATE TIME 797490738844     PRODUCT CODE [id number]     BLOOD TYPE O POS     CODING STATUS 5100     BLOOD EXPIRATION DATE 797489837640     Transfusion Status OK TO TRANSFUSE     Crossmatch Result COMPATIBLE,ELECTRONIC     Unit Number [id number]     Blood Component Type RBC,ADSOL,LEUKO REDUCED     Unit Division 00     Status OF Unit ISSUED     ISSUE DATE TIME 797490738790     PRODUCT CODE [id number]     BLOOD TYPE A POS     CODING STATUS 6200     BLOOD EXPIRATION DATE 797489797640     Transfusion Status OK TO TRANSFUSE     Crossmatch Result COMPATIBLE,ELECTRONIC     Unit Number [id number]     Blood Component Type RBC,ADSOL,LEUKO REDUCED     Unit Division 00     Status OF Unit ISSUED     ISSUE DATE TIME 797490738790  PRODUCT CODE E0336V00     BLOOD TYPE A POS     CODING STATUS 6200     BLOOD EXPIRATION DATE 797489797640     Transfusion Status OK TO TRANSFUSE     Crossmatch Result COMPATIBLE,ELECTRONIC     Unit Number [id number]     Blood Component Type RBC,ADSOL,LEUKO REDUCED     Unit Division 00     Status OF Unit ISSUED     ISSUE DATE TIME 797490738790     PRODUCT CODE [id number]     BLOOD TYPE A POS     CODING STATUS 6200     BLOOD EXPIRATION DATE 797489797640     Transfusion Status OK TO TRANSFUSE     Crossmatch Result COMPATIBLE,ELECTRONIC     Unit Number [id number]     Blood Component Type RBC,ADSOL,LEUKO REDUCED     Unit Division 00     Status OF Unit ISSUED     ISSUE DATE TIME 797490738790     PRODUCT CODE [id number]     BLOOD TYPE A POS     CODING STATUS 6200     BLOOD EXPIRATION DATE 797489797640     Transfusion Status OK TO TRANSFUSE     Crossmatch Result COMPATIBLE,ELECTRONIC     Unit Number [id number]     Blood Component Type RBC,ADSOL,LEUKO REDUCED     Unit Division 00     Status OF Unit ISSUED     ISSUE DATE TIME 797490738790     PRODUCT CODE [id number]     BLOOD TYPE A POS     CODING STATUS 6200     BLOOD EXPIRATION DATE 797489797640     Transfusion Status OK TO TRANSFUSE     Crossmatch Result COMPATIBLE,ELECTRONIC     Unit Number [id number]     Blood Component Type RBC,ADSOL,LEUKO REDUCED     Unit Division 00     Status OF Unit ISSUED     ISSUE DATE TIME 797490738790     PRODUCT CODE [id number]     BLOOD TYPE A POS     CODING STATUS 6200     BLOOD EXPIRATION DATE 797489797640     Transfusion Status OK TO TRANSFUSE     Crossmatch Result COMPATIBLE,ELECTRONIC    POC BLOOD GAS VEN    Collection Time: 01/15/24 12:12 AM   Result Value Ref Range    PH-VEN-POC 7.39 7.30 - 7.40    PCO2-VEN-POC 45 36 - 50 mm[Hg]    PO2-VEN-POC 35 33 - 48 mm[Hg]    Base Ex-VEN-POC 2 mmol/L    O2 Sat-VEN-POC 66 55 - 71 %    Bicarbonate-VEN- POC 27 mmol/L   POC HEMATOCRIT&HEMOGLOBIN    Collection Time: 01/15/24 12:12 AM   Result Value Ref Range    Hemoglobin POC 7.5 (L) 13.5 - 16.5 g/dL    Hematocrit POC 22 (L) 40 - 50 %   POC SODIUM    Collection Time: 01/15/24 12:12 AM   Result Value Ref Range    SODIUM-POC 140 137 - 147 mmol/L   POC POTASSIUM    Collection Time: 01/15/24 12:12 AM   Result Value Ref Range    Potassium-POC 3.7 3.5 - 5.1 mmol/L   COMPREHENSIVE METABOLIC PANEL    Collection Time: 01/15/24  4:22 AM   Result Value Ref Range    Sodium 138 137 - 147 mmol/L    Potassium 4.4 3.5 - 5.1 mmol/L    Chloride 106 98 - 110 mmol/L    Glucose 118 (H)  70 - 100 mg/dL    Blood Urea Nitrogen 16 7 - 25 mg/dL    Creatinine 9.23 9.59 - 1.24 mg/dL    Calcium 7.9 (L) 8.5 - 10.6 mg/dL    Total Protein 4.1 (L) 6.0 - 8.0 g/dL    Total Bilirubin 0.4 0.2 - 1.3 mg/dL    Albumin 2.8 (L) 3.5 - 5.0 g/dL    Alk Phosphatase 28 25 - 110 U/L    AST 16 7 - 40 U/L    ALT 14 7 - 56 U/L    CO2 27 21 - 30 mmol/L    Anion Gap 5 3 - 12    Glomerular Filtration Rate (GFR) >60 >60 mL/min   CBC    Collection Time: 01/15/24  4:22 AM   Result Value Ref Range    White Blood Cells 6.40 4.50 - 11.00 10*3/uL    Red Blood Cells 1.92 (L) 4.40 - 5.50 10*6/uL    Hemoglobin 5.8 (LL) 13.5 - 16.5 g/dL    Hematocrit 83.2 (L) 40.0 - 50.0 %    MCV 87.3 80.0 - 100.0 fL    MCH 30.5 26.0 - 34.0 pg    MCHC 35.0 32.0 - 36.0 g/dL    RDW 86.1 88.9 - 84.9 %    Platelet Count 167 150 - 400 10*3/uL    MPV 9.1 7.0 - 11.0 fL   POC GLUCOSE    Collection Time: 01/15/24 11:15 AM   Result Value Ref Range    Glucose, POC 105 (H) 70 - 100 mg/dL   HEMOGLOBIN & HEMATOCRIT    Collection Time: 01/15/24 11:31 AM   Result Value Ref Range    Hemoglobin 8.2 (L) 13.5 - 16.5 g/dL    Hematocrit 76.7 (L) 40.0 - 50.0 %   POC BLOOD GAS VEN    Collection Time: 01/15/24 11:48 AM   Result Value Ref Range    PH-VEN-POC 7.40 7.30 - 7.40    PCO2-VEN-POC 39 36 - 50 mm[Hg]    PO2-VEN-POC 17 (L) 33 - 48 mm[Hg]    Base Def-VEN-POC 1 mmol/L    O2 Sat-VEN-POC 25 (L) 55 - 71 %    Bicarbonate-VEN- POC 24 mmol/L   POC HEMATOCRIT&HEMOGLOBIN    Collection Time: 01/15/24 11:48 AM   Result Value Ref Range    Hemoglobin POC 5.8 (LL) 13.5 - 16.5 g/dL    Hematocrit POC 17 (L) 40 - 50 %   POC SODIUM    Collection Time: 01/15/24 11:48 AM   Result Value Ref Range    SODIUM-POC 143 137 - 147 mmol/L   POC POTASSIUM    Collection Time: 01/15/24 11:48 AM   Result Value Ref Range    Potassium-POC 4.0 3.5 - 5.1 mmol/L   POC LACTATE    Collection Time: 01/15/24 11:51 AM   Result Value Ref Range    LACTIC ACID POC 0.8 0.5 - 2.0 mmol/L   PREPARE APHERESIS PLATELETS MTP    Collection Time: 01/15/24 11:53 AM   Result Value Ref Range    Units Ordered 6     Record Check FOUND     Unit Number [id number]     Blood Component Type       APHERESIS PLT,LEUKO REDUCED, BACTERIAL MONITOR 7D, 2ND CONT    Unit Division 00     Status OF Unit ISSUED     ISSUE DATE TIME [id number]     PRODUCT CODE Z4628C99     BLOOD TYPE A NEG  CODING STATUS 0600     BLOOD EXPIRATION DATE 797490717640     Transfusion Status OK TO TRANSFUSE    PREPARE PLASMA (FFP) MTP    Collection Time: 01/15/24 11:53 AM   Result Value Ref Range    Units Ordered 48     Record Check FOUND     Unit Number [id number]     Blood Component Type THAWED PLASMA     Unit Division 00     Status OF Unit ISSUED     ISSUE DATE TIME 797490738842     PRODUCT CODE Z7315C99     BLOOD TYPE A POS     CODING STATUS 6200     BLOOD EXPIRATION DATE 797490697640     Transfusion Status OK TO TRANSFUSE     Unit Number [id number]     Blood Component Type THAWED PLASMA     Unit Division 00     Status OF Unit ISSUED     ISSUE DATE TIME 797490738842     PRODUCT CODE Z7315C99     BLOOD TYPE A POS     CODING STATUS 6200     BLOOD EXPIRATION DATE 797490697640     Transfusion Status OK TO TRANSFUSE     Unit Number [id number]     Blood Component Type THAWED PLASMA     Unit Division 00     Status OF Unit ISSUED     ISSUE DATE TIME 797490738842     PRODUCT CODE Z7315C99     BLOOD TYPE A POS     CODING STATUS 6200     BLOOD EXPIRATION DATE 797490697640     Transfusion Status OK TO TRANSFUSE     Unit Number [id number]     Blood Component Type THAWED PLASMA     Unit Division 00     Status OF Unit ISSUED     ISSUE DATE TIME 797490738842     PRODUCT CODE Z7315C99     BLOOD TYPE A POS     CODING STATUS 6200     BLOOD EXPIRATION DATE 797490697640     Transfusion Status OK TO TRANSFUSE     Unit Number [id number]     Blood Component Type THAWED PLASMA     Unit Division 00     Status OF Unit ISSUED     ISSUE DATE TIME 797490738842     PRODUCT CODE Z7315C99     BLOOD TYPE A POS     CODING STATUS 6200     BLOOD EXPIRATION DATE 797490697640     Transfusion Status OK TO TRANSFUSE     Unit Number [id number] Blood Component Type THAWED PLASMA     Unit Division 00     Status OF Unit ISSUED     ISSUE DATE TIME 797490738842     PRODUCT CODE Z7315C99     BLOOD TYPE A POS     CODING STATUS 6200     BLOOD EXPIRATION DATE 797490697640     Transfusion Status OK TO TRANSFUSE                   Point of Care Testing  (Last 24 hours)  Glucose: (!) 118 (01/15/24 0422)  POC Glucose (Download): (!) 105 (01/15/24 1115)

## 2024-01-15 NOTE — Unmapped
 Procedure Note    Danny Kidd is a 43 y.o. male.        Procedure: Airway Placement    Intubation    Date/Time: 01/15/2024 1:22 PM          Patient location: ICU  Urgency: emergent  Difficult Airway: No  Airway   Mallampati: II  Neck ROM: full  TM distance: >3 FB  Mouth opening: good  Airway patency: adequate  Pre-existing airway: ETT    The procedure was performed in an emergent situation.  Verbal consent not obtained.  Written consent not obtained.  Risks and benefits discussed: no  Patient identity confirmation: arm band and hospital-assigned identification number    Airway Procedure  Indication(s) for airway management: respiratory distress and cardiovascular instability    Sedation medication: etomidate  Paralytics given: succinylcholine   Rapid Sequence Induction (RSI) used: yes  Level of sedation achieved: Unconscious, no arousal with painful stimuli  Preoxygenated: yes  Patient position: sniffing  Neck stabilization: no in-line stabilization    Mask difficulty assessment: 0 - not attempted      Procedure Outcome  Final airway type: endotracheal airway  Endotracheal airway: ETT          ETT size (mm): 7.5  Technique used for successful ETT placement: video laryngoscopy  Devices/methods used in placement: intubating Airway  Insertion site: oral  Blade type: Glide Scope   Laryngoscope/Videolaryngoscope blade size: 4  Cormack-Lehane classification: grade I - full view of glottis      Measured from: teeth       Number of attempts at approach: 1  Placement verified by auscultation, bronchoscopy, capnometry and radiography                Performed by: Sharie Bayley, MBBS  Authorized by: Evia Smothers, MD            Bayley Sharie, MBBS

## 2024-01-15 NOTE — Progress Notes
 1115: Staff emergency called in patient's room by RN Charletta Monte. On arrival in room, patient appeared pale and was unresponsive while being held up on bedside commode by nursing staff. Multiple staff members assisted patient back to bed, as he remained unresponsive to verbal and physical stimuli. Throughout this episode, patient's HR remained within ordered parameters and was normal sinus rhythm on cardiac monitor. Oxygen saturation was 100% on RA. On return to bed, BP 81/64. Once in bed, patient once again became responsive. Patient was oriented x4 and had no changes on physical assessment. Patient's only complaints were abdominal cramping, dizziness and nausea. 400 mL bloody liquid stool noted in bedside commode. Primary team Med Private R paged.    1118: BP improved to 102/64. All other VSS. Med Private R returned page and notified of syncopal episode. Verbal order for 1L NS bolus obtained. Primary team to come to bedside for evaluation.    1130: Dr. Balinda Maxon (Med Private R) responded to bedside. Patient received 2 units of RBCs and 1L bolus of NS. Additional CBC obtained. Since initial assessment of patient @ 1115, patient has had four large, black bowel movements concerning for GI bleed. Current BP 106/58, HR 92, 100% O2 on RA. Patient continues to complain of abdominal cramping in LLQ.     1137: Rapid response called, see rapid response documentation for further information.

## 2024-01-15 NOTE — Case Management (ED)
 Case Management Progress Note    NAME:Danny Kidd                          MRN: [id number]              DOB:1981/01/27          AGE: 43 y.o.  ADMISSION DATE: 01/10/2024             DAYS ADMITTED: LOS: 5 days      Today's Date: 01/15/2024    PLAN: Patient anticipated to discharge home once medically stable with no identified NCM needs.    Expected Discharge Date: 01/18/2024   Is Patient Medically Stable: No, Please explain: transfusing 2 units pRBCs.  Are there Barriers to Discharge? No    INTERVENTION/DISPOSITION:  Discharge Planning              Discharge Planning: No Needs Identified  NCM reviewed EMR.  NCM attended and participated in MPR huddle.  Patient not medically stable for discharge home today. Patient with hemoglobin of 5.8 this morning. Patient to receive 2 units of pRBCs today. GI following.  Anticipate patient to discharge home once stable with no identified NCM needs.  Will continue to follow.  Transportation              Does the Patient Need Case Management to Arrange Discharge Transport? (ex: facility, ambulance, wheelchair/stretcher, Medicaid, cab, other): No  Will the Patient Use Family Transport?: Yes  Transportation Name, Phone and Availability #1: Patient's partner [name] [phone] will provide transportation upon discharge.  Support              Support: Huddle/team update  Info or Referral              Information or Referral to Community Resources: No Needs Identified  Positive SDOH Domains and Potential Barriers     Medication Needs              Medication Needs: No Needs Identified  Financial              Financial: No Needs Identified  Legal              Legal: No Needs Identified  Other              Other/None: No needs identified  Discharge Disposition                                  Selected Continued Care - Admitted Since 01/10/2024    No services have been selected for the patient.         Larraine Blumenthal, BSN, RN  Med Private R Nurse Case Manager  [phone] and available on Voalte

## 2024-01-15 NOTE — H&P (View-Only)
 Interventional Radiology Consult Note      Admission Date: 01/10/2024  LOS: 5 days                       Principal Problem:    Hematemesis  Active Problems:    Gastrointestinal hemorrhage    Marginal ulcer      Reason for consult: GI bleeding    Assessment:   - Admitted with Acute hemorrhage in the setting of hematemesis and melena with EGD demostrating anastomatic ulcer (hx of roux-en-Y) and friable tissue. No active bleeding seen on EGD  - Prior CTAs were negative for acute extravasation  - Plan for IR mesenteric angiogram with possible intervention  - Labs, medications, and allergies meet procedural protocol.   - Pt is not on a therapeutic blood thinner  -   Platelet Count   Date Value Ref Range Status   01/15/2024 178 150 - 400 10*3/uL Final   ;   INR   Date Value Ref Range Status   01/10/2024 1.2 0.9 - 1.2 Final       Plan:  - IR mesenteric angiogram with possible embolization, which has been reviewed and added onto the schedule  - Procedural risks include: non-target embolization, infection, bleeding, damage to nearby structures  - For sedation purposes, please keep NPO prior to procedure. May take PO medications with sips of water.    We appreciate being able to participate in this patient's care. Please page with any questions or concerns.    Kristen Amel, MD   [facility]    IR Team Pager [phone] (After-hours and Weekends)  __________________________________________________________________      Procedure: Mesenteric angiogram with possible intervention    IR Notes:  ICU patient, coming with anesthesia  __________________________________________________________________    Chief Complaint:  acute blood loss anemia    Previous Anesthetic/Sedation History:  Reviewed.    Code Status: Full Code    History of present illness:  Danny Kidd is a 43 y.o. male patient with hx of Roux-en-Y gastric bypass that presented with hematemesis and abdominal pain. EGD on 9/23 demonstrated non-bleeding esophageal ulcer and jejunal anastomotic ulcer without active bleeding. Patient had acute hemorrhage this morning with rapid response called. Prior CTAs have been negative. IR consulted for mesenteric angiogram with possible intervention. See ROS below for current symptoms    Review of Systems  Not obtained due to emergent nature of procedure    Medications  Scheduled Meds:CALCIUM CHLORIDE 100 MG/ML (10 %) IV SYRG (Cabinet Override), , , NOW  [Held by Provider] gabapentin  (NEURONTIN ) capsule 300 mg, 300 mg, Oral, TID  lidocaine  (LIDODERM ) 5 % topical patch 1 patch, 1 patch, Topical, QDAY  nicotine  (NICODERM CQ ) 14 mg/day patch 1 patch, 1 patch, Transdermal, QDAY  pantoprazole  (PROTONIX ) injection 40 mg, 40 mg, Intravenous, API(88-78)  rosuvastatin  (CRESTOR ) tablet 20 mg, 20 mg, Oral, QDAY  SODIUM CHLORIDE  0.9% IV SOLP (Cabinet Override), , , NOW  SODIUM CHLORIDE  0.9% IV SOLP (Cabinet Override), , , NOW  SODIUM CHLORIDE  0.9% IV SOLP (Cabinet Override), , , NOW  SODIUM CHLORIDE  0.9% IV SOLP (Cabinet Override), , , NOW    Continuous Infusions:   fentaNYL  (SUBLIMAZE ) 1000 mcg/100 mL NS IV drip (std conc)(premade) 100 mcg/hr (01/15/24 1220)    norepinephrine (LEVOPHED) 4 mg in dextrose 5% (D5W) 250 mL IV drip (std conc) 0.06 mcg/kg/min (01/15/24 1250)    propofoL  (DIPRIVAN ) 10 mg/mL IV drip 70 mcg/kg/min (01/15/24 1235)    vasopressin (VASOSTRICT)  20 units in sodium chloride  0.9% (NS) 100 mL IV infusion (std conc)(premade) 1.8 Units/hr (01/15/24 1240)     PRN and Respiratory Meds:acetaminophen  Q4H PRN, aluminum-magnesium  hydroxide Q4H PRN, cetirizine QDAY PRN, fentaNYL  TITRATE **AND** fentaNYL  Q30 MIN PRN, magnesium  oxide PRN **OR** magnesium  sulfate PRN (On Call from Rx), [Held by Provider] melatonin QHS PRN, nalOXone PRN, ondansetron  Q6H PRN **OR** ondansetron  Q6H PRN, [Held by Provider] oxyCODONE  Q6H PRN, polyethylene glycol 3350 QDAY PRN, prochlorperazine  edisylate Q6H PRN, sennosides-docusate sodium QDAY PRN      Objective Vital Signs: Last Filed                 Vital Signs: 24 Hour Range   BP: 146/70 (09/26 1301)  Temp: 36.9 ?C (98.4 ?F) (09/26 1200)  Pulse: 80 (09/26 1301)  Respirations: 22 PER MINUTE (09/26 1301)  SpO2: 100 % (09/26 1301)  O2%: 100 % (09/26 1301)  O2 Device: Ventilator (09/26 1301) BP: (79-147)/(37-111)   Temp:  [36.4 ?C (97.6 ?F)-36.9 ?C (98.5 ?F)]   Pulse:  [64-121]   Respirations:  [12 PER MINUTE-26 PER MINUTE]   SpO2:  [92 %-100 %]   O2%:  [100 %]   O2 Device: Ventilator     Vitals:    01/10/24 2010 01/11/24 0415 01/14/24 0800   Weight: 80.8 kg (178 lb 2.1 oz) 80.1 kg (176 lb 9.4 oz) 80.1 kg (176 lb 9.4 oz)         Intake/Output Summary:  (Last 24 hours)    Intake/Output Summary (Last 24 hours) at 01/15/2024 1302  Last data filed at 01/15/2024 1259  Gross per 24 hour   Intake 4425 ml   Output 400 ml   Net 4025 ml           Physical Exam  Not performed due to emergent nature of case    Airway:  Per anesthesia   Anesthesia Classification:  Per anesthesia   Pre procedure anxiolysis plan: Per anesthesia   Intra-procedural Sedation/Medication Plan: Per anesthesia   Personal history of sedation complications: Denies adverse event.   Family history of sedation complications: Denies adverse event.   Medications for Reversal: Per anesthesia   Discussion/Reviews:  Physician has discussed risks and alternatives of this type of sedation and above planned procedures with other, family member (Marvolaide Chacon)  NPO Status: Acceptable  Pregnancy Status: N/A    Lab/Radiology/Other Diagnostic Tests:  Labs:  Pertinent labs reviewed  Radiology: Reviewed.

## 2024-01-15 NOTE — H&P (View-Only)
 Critical Care   Admission History and Physical Assessment         Name:  Danny Kidd                                             MRN:  [id number]   Admission Date:  01/10/2024  Principal Problem:    Hematemesis  Active Problems:    Gastrointestinal hemorrhage    Marginal ulcer    On mechanically assisted ventilation (CMS-HCC)    Acute blood loss anemia    Hematemesis                   Assessment and Plan     [facility] 43 y.o. w/ PMHx Roux-en-Y gastric bypass, HFpEF, HTN, Papillary thyroid carcinoma s/p thyroidectomy and T2DM. Presented to [place] on 9/21 for hematemesis and abdominal pain. Underwent EGD on 9/24 that showed one superficial esophageal ulcer with no stigmata of recent bleeding was found at the GE[facility]. Today 9/26 developed acute hematemesis requiring emergent intubation and MTP activation. Hgb 5.8 << 8.0 & hypotensive. Given 10 units PRBC, 6 FFP, 1 unit of platelets. Started on Levophed gtt and emergently taken to IR now s/p coiling. Transfered to MICU for further monitoring.     NEURO  Sedated on Mechanical Ventilation - (new)  - PTA Gabapentin    - Emergently intubated  - Currently on Fent and Propofol   PLAN  - Continue sedation   - Hold PTA gabapentin      PULM  Acute Hypoxic Respiratory Failure  - No O2 needs at baseline  - CXR 9/21: Stable mild cardiomegaly without acute cardiopulmonary abnormality.   - Emergently intubated during RRT  - VBG 7.40 / 39  - Currently on V/A  PLAN  - Obtain CXR s/p intubation  - Obtain ABG    - Hold Nicotine  patch    CV  Shock - (resolved)   HFpEF  HTN  - PTA Metoprolol  100mg , Lasix  40mg  , Crestor    - TTE 12/26/2022: Moderately dilated left ventricle ejection fraction of 50%. Indeterminate left ventricular diastolic dysfunction. 40 mmHg. CVP 10 to 20 mmHg.   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  - CT angiogram abdomen/pelvis 01/10/2024: Prior bowel NY gastric bypass with fat stranding between the gastrojejunostomy and anterior aspect of the pancreatic body jejunum adjacent to gastrojejunostomy anastomotic site.  There is no intraperitoneal fluid collection, significant ascites, or pneumoperitoneum.   - Received one unit PRBCs 9/21, 2 more units received 9/24   - EGD 9/23: One superficial esophageal ulcer with no bleeding was found 41cm from the incisors. The lesion was 3mm in largest dimesnion. 1ch hiatal hernia was present. Gastrojejunal anastamosis characterized by ulceration on the jejunal side that was difficult to visualize and  cuaght glimpses of a small clot. Biopsies were taken via cold forcepts and sent to pathology.   - EGD 9/24: Evidence of a Roux-en-Y gastrojejunostomy was found. The gastrojejunal anastomosis was characterized by ulceration on the jejunal side. Positioning was difficult with continued slippage. Few flat red spots were seen. Another non-pulsating bulge was also evaluated without obvious vessel. Second endoscopist also viewed this area. Friability along the borders, but no active bleeding. Coagulation for bleeding prevention using bipolar probe was successful and Nexpowder then applied   - Today (9/26): Acute hematemesis requiring emergent intubation and MTP activated   - IR: A distal branch of the left gastric was coiled to hemostasis and the entire right gastric artery was coiled to hemostasis.  - Last BMP 9/26  PLAN  - GI following  - CBC Q6hr  - PPI BID  - NPO  - Zofran  PRN    RENAL  - Baseline Cr ~0.76  - Cr 0.76, BUN 16, Na 140, Co2 27, AG 5  - I/O not accurate  PLAN  - Strict I/O and daily weights  - BMP BID    ENDO  T2DM  - 01/2023 A1C 8.5  - Patient not taking metformin  as per endocrinologist a few months ago, pt was taking metformin  1000mg  BID prior  - 110lb wt loss since roux en Y 03/2023  PLAN  - LDCF  - Repeat A1C outpatient     ID  Leukocytosis - resolved   - Mild elevated WBC on admission to 13.9, most likely reactive  - Blood cx NGTD  - WBC 6.40 today   PLAN  - monitor off abx    HEME  Acute blood loss anemia   - GIB as noted above  PLAN  - TEG if more bleeding or hypotensive   - CBC Q6hrs  - Hold DVT ppx    FEN  - NPO   - IVF  - review electrolytes and replace as needed    Prophylaxis Review:  Lines:  PIV, cordis, A-line  Drains/Tubes: Foley  Urinary Catheter:  Yes  VTE PPX: None with GIB  GI ppx:  PPI BID    Code status: Full code  Disposition: ICU     pt critically ill with above. I spent 70 minutes providing critical care services including:  reviewing outside records and obtaining history from the patient/family members  performing a physical examination  serially reviewing laboratory, telemetry, hemodynamic, oximetry, and respiratory data  reviewing radiographic images  reviewing medications  managing fluids/electrolytes, antibiotics, ICU prophylaxis, and mechanical ventilation   developing the overall plan of care.     Jayson Gailen Castor, APRN  [facility]ul[facility] Critical Care - Night APP  M4 pager [phone]   01/15/2024   ___________________________________________________________________________  Primary Care Physician: Luevenia Riser A        PMH:  Past Medical History:   ? Cancer (CMS-HCC)   ? DM (diabetes mellitus) (CMS-HCC)   ? Heart failure with preserved ejection fraction (CMS-HCC)   ? Hypertension   ? Lipoma   ? Varicose veins of lower extremity        PSH:  Surgical History:   Procedure Laterality Date   ? HX ENDOVENOUS ABLATION OF THE GREAT SAPHENOUS VEIN Right 06/20/2021    Dr. Leron   ? HX MICROPHLEBECTOMY Right 06/20/2021 RMP Dr. Leron   ? EXCISION OF LEFT ABDOMINAL WALL MASS Left 08/19/2021    Performed by Alice Laymon CROME, DO at [facility] OR   ? VARICOSE VEIN  SURGERY Right 04/03/2022    percutaneous ablation of the right perforator veins- Dr. Leron   ? THYROID LOBECTOMY TOTAL UNILATERAL Left 12/24/2022    Performed by Leo Gustav HERO, MD at [facility] OR   ? ESOPHAGOGASTRODUODENOSCOPY, WITH BIOPSY N/A 01/12/2024    Performed by Leonce Emerick PARAS, DO at [facility] ENDO   ? ESOPHAGOGASTRODUODENOSCOPY WITH CONTROL OF BLEEDING - FLEXIBLE N/A 01/13/2024    Performed by Leonce Emerick PARAS, DO at [facility] ENDO   ? GASTRIC BYPASS          SOCIAL HISTORY:  Social History[1]     FAMILY HISTORY:  No family history on file.     IMMUNIZATIONS:   Immunization History   Administered Date(s) Administered   ? COVID-19 [facility] & JOHNSON/JANSSEN) recombinant protein vacc, 0.5 mL (PF) 06/13/2020   ? COVID-19 Bivalent (83YR+)(PFIZER), mRNA vacc, 54mcg/0.3mL 12/10/2021   ? Pneumococcal Vaccine (20-Val) 12/10/2021   ? Tdap Vaccine 12/10/2021           ALLERGIES:  Patient has no known allergies.    HOME MEDICATIONS:  Medications Prior to Admission   Medication Sig   ? acetaminophen  SR (TYLENOL  ARTHRITIS PAIN) 650 mg tablet Take one tablet by mouth every 8 hours as needed for Pain. Indications: pain   ? cetirizine (ZYRTEC) 10 mg tablet Take one tablet by mouth daily as needed for Allergy symptoms.   ? furosemide  (LASIX ) 40 mg tablet Take one tablet by mouth every morning.   ? gabapentin  (NEURONTIN ) 300 mg capsule Take one capsule by mouth three times daily.   ? losartan  (COZAAR ) 50 mg tablet Take one tablet by mouth daily.   ? metFORMIN  (GLUCOPHAGE ) 1,000 mg tablet TAKE ONE TABLET BY MOUTH TWICE DAILY AFTER MEALS.   ? metoprolol  succinate XL (TOPROL  XL) 100 mg extended release tablet Take one tablet by mouth daily.   ? oxyCODONE  (ROXICODONE ) 5 mg tablet Take one tablet by mouth every 6 hours as needed for Pain for up to 12 doses.   ? rosuvastatin  (CRESTOR ) 20 mg tablet Take one tablet by mouth daily.        ROS:  Review of Systems   Unable to perform ROS: Acuity of condition (Unable to obtain ROS as patient is intubated and sedate)        Physical Exam:  BP (!) 154/76  - Pulse 80  - Temp 36.9 ?C (98.4 ?F)  - Ht 168 cm (5' 6.14)  - Wt 80.1 kg (176 lb 9.4 oz)  - SpO2 100%  - BMI 28.38 kg/m?     Physical Exam  Constitutional:       General: He is not in acute distress.     Appearance: He is ill-appearing.   HENT:      Head: Normocephalic and atraumatic.      Right Ear: External ear normal.      Left Ear: External ear normal.      Nose: Nose normal.      Mouth/Throat:      Mouth: Mucous membranes are moist.   Eyes:      Pupils: Pupils are equal, round, and reactive to light.   Cardiovascular:      Rate and Rhythm: Normal rate and regular rhythm.   Pulmonary:      Effort: Pulmonary effort is normal.      Breath sounds: Normal breath sounds.   Abdominal:      General: Abdomen is flat.      Palpations: Abdomen is soft.  Musculoskeletal:         General: Normal range of motion.   Skin:     General: Skin is warm and dry.      Capillary Refill: Capillary refill takes 2 to 3 seconds.   Neurological:      General: No focal deficit present.        Artificial Airway   Endotracheal Tube       Ventilator/Respiratory Therapy  Yes:       Vent Weaning   Per protocol    Laboratory:  Recent Labs     01/12/24  2216 01/13/24  0339 01/14/24  0122 01/15/24  0010 01/15/24  0422 01/15/24  1228 01/15/24  1356 01/15/24  1521   NA 138 137 140 140 138 140 139 139   K 3.6 4.6 3.3* 3.7 4.4 4.5 5.3* 3.7   CL 103 106 107 106 106  --   --  109   CO2 24 26 25 28 27   --   --  25   GAP 11 5 8 6 5   --   --  5   BUN 20 33* 19 11 16   --   --  18   CR 0.97 0.88 0.79 0.87 0.76  --   --  0.73   GLU 130* 149* 87 131* 118* 189* 198* 156*   CA 9.0 7.9* 8.1* 8.6 7.9*  --   --  7.4*   ALBUMIN 4.0 3.0* 3.1*  --  2.8*  --   --  2.8*       Recent Labs     01/12/24  2216 01/13/24  0339 01/13/24  0546 01/13/24  0629 01/13/24  1323 01/13/24  1930 01/14/24  0122 01/14/24  0654 01/14/24  1547 01/14/24  2109 01/15/24  0010 01/15/24  0422 01/15/24  1131 01/15/24  1220 01/15/24  1228 01/15/24  1521   WBC 9.40  --  6.30  --  6.70 6.30 6.50 5.90 6.80 7.80 6.20 6.40  --   --   --   --    HGB 10.2*  --  6.3* 6.3* 8.5* 8.5* 8.3* 8.0* 9.2* 10.2* 7.8* 5.8* 8.2*  --  8.0*  --    HCT 28.5*  --  18.1* 17.7* 23.7* 24.1* 23.4* 22.4* 26.2* 29.4* 22.5* 16.7* 23.2*  --  23.8*  --    PLTCT 268  --  191  --  184 164 165 169 195 247 218 167  --   --  178  --    PT  --   --   --   --   --   --   --   --   --   --   --   --   --  14.7* 16.9*  --    INR  --   --   --   --   --   --   --   --   --   --   --   --   --  1.3* 1.5*  --    PTT  --   --   --   --   --   --   --   --   --   --   --   --   --  20.6* 25.1  --    AST 13 12  --   --   --   --  10  --   --   --   --  16  --   --   --  44*   ALT 9 9  --   --   --   --  7  --   --   --   --  14  --   --   --  36   ALKPHOS 45 25  --   --   --   --  26  --   --   --   --  28  --   --   --  39      Estimated Creatinine Clearance: 130.1 mL/min (by C-G formula based on SCr of 0.73 mg/dL).  Vitals:    01/10/24 2010 01/11/24 0415 01/14/24 0800   Weight: 80.8 kg (178 lb 2.1 oz) 80.1 kg (176 lb 9.4 oz) 80.1 kg (176 lb 9.4 oz)    No results for input(s): PHART, PO2ART in the last 72 hours.    Invalid input(s): PC02A              Radiology and Other Diagnostic Procedures Review:    Reviewed                   [1]  Social History  Socioeconomic History   ? Marital status: Single   Tobacco Use   ? Smoking status: Every Day     Current packs/day: 0.00     Types: Cigarettes     Last attempt to quit: 12/19/2021     Years since quitting: 2.0     Passive exposure: Current   ? Smokeless tobacco: Never   ? Tobacco comments:     2-3 cigarettes on Sundays with family     Previously smoked for 8 years half pack a day (~4 pack years)   Vaping Use   ? Vaping status: Never Used   Substance and Sexual Activity   ? Alcohol use: Not Currently     Comment: About a drink a week   ? Drug use: Never

## 2024-01-15 NOTE — Progress Notes
 Neuro ICU team was notified regarding sudden hypotension with noted hematochezia, upon bedside evaluation patient started to vomit fresh bright red blood with estimated volume around 500 mL, patient was immediately intubated for airway protection.  A multi access catheter was placed, MTP was activated, patient received total of 6 units PRBC, 6 FFP, 1 unit of platelets, please see MTP note for more updated number of units. GI stat consulted, after discussion with IR patient will go down for angiogram and possible IR intervention given hemodynamic instability.    MICU team evaluated and will accept patient following IR procedure.    Discussed with Dr. Hermenia.       Mancel Drought, [place]  Pulmonary and Critical Care fellow.

## 2024-01-15 NOTE — Progress Notes
 2345: Pt called this RN into room with complaints of nausea and epigastric pain. Pt pale, diaphoretic with decreased LOC. Initial BP 101/69 HR 72. Buren Daniels MD paged to bedside.     0000: Buren Daniels MD at bedside, orders for fluids, labs, and GI cocktail.     0033: CBC back with Hgb drop from 10.2 (collected at 2130) to 7.8. Le MD notified. No new orders.     Pt hooked up to tele monitor, VSS       01/15/24 0000   Critical Care Vitals Adult   Pulse 68   Monitored Rhythm Sinus rhythm   Respirations 18 PER MINUTE   SpO2 97 %   O2 Device None (Room air)   BP (!) 81/43   Mean NBP (Calculated) (!) 52 MM HG   BP Source Arm, Right Upper   BP Patient Position Head of bed (Comment degree)  (30)   Neurological   Neurological WDL X   Modified RASS Score -1 Drowsy   LOC Lethargic   Orientation Oriented x4   Cognition Decrease attention/ concentration;Follows Administrator, Civil Service barrier Garment/textile technologist used as indicated for communication this shift)   All Extremity Sensation Abnormal sensation in specific extremity/extremities   Sensation RUE Tingling   Sensation LUE Tingling   All Extremity Strength 4   Glasgow Coma Scale   Eye Opening 3   Best Verbal Response 5   Best Motor Response 6   Glasgow Coma Scale Score 14   Gastrointestinal   Gastrointestinal WDL X   Abdomen Tender;Guarding   All Quadrants Bowel Sounds Hyperactive   Bowel Function Diarrhea   Integumentary   Integumentary WDL X   Generalized Skin Color Pale   Generalized Skin Temp Cold   Generalized Skin Condition Diaphoretic

## 2024-01-15 NOTE — Unmapped
 Procedure Note    Darious Rehman is a 43 y.o. male.      Central Line Insertion    Date/Time: 01/15/2024 1:24 PM    Performed by: Sharie Bayley, MBBS  Authorized by: Evia Smothers, MD  Consent: The procedure was performed in an emergent situation. Verbal consent obtained  Relevant documents: relevant documents present and verified  Test results: test results available and properly labeled  Site marked: the operative site was marked  Imaging studies: imaging studies available  Required items: required blood products, implants, devices, and special equipment available  Patient identity confirmed: arm band and hospital-assigned identification number    Indication: Vascular access  Diagnosis: Hemorrhagic Shock    Sedation:  Patient sedated: yes  Sedation type: Continuously sedated under mechanical ventilation protocol  Preparation: skin prepped with chlorhexidine  Location details: right internal jugular  Patient position: flat  Catheter type: Cordis  Pre-procedure: landmarks identified  Ultrasound guidance: yes  Number of attempts: 1  Successful placement: yes  Post-procedure: line sutured and dressing applied  Assessment: blood return through all parts and placement verified by x-ray  Patient tolerance: patient tolerated the procedure well with no immediate complications              Bayley Sharie, MBBS

## 2024-01-15 NOTE — Progress Notes
 MICU STAFF NOTE    This note is an attestation for the nurse practitioner note dated today.    I have seen, personally fully evaluated, and discussed patient with the Medical ICU team.  I agree with the objective findings and agree with the plan of care as documented by the nurse practitioner with the exceptions noted.  The patient is critically ill with the conditions listed below:    Principal Problem:    Hematemesis  Active Problems:    Gastrointestinal hemorrhage    Marginal ulcer      I spent 75 minutes (excluding time spent performing or supervising any procedures) providing and personally directing critical care services including:    Systems and physical examination  Review and management of ICU prophylaxis and core measures  Review of laboratory data  Review of telemetry data  Review of imaging studies  Review of medications  Fluid and electrolyte management  Hemodynamic monitoring and management  Management of mechanical ventilation    RRT activated early this afternoon with brisk GI bleeding and large volume hematemesis. Patient emergently intubated, IV access obtained and massive transfusion protocol activated.  Exam notable for intubated, sedated male with ETT in place, tachy but regular heart rhythm, equal breath sounds and no LE edema.    Plan for IR angiogram emergently today. Trend CBC frequently and monitor for bleeding following angiogram. Continue ventilatory support and sedation following procedure. Continue IV ppi and monitor renal function closely.      Staff name:  Rockey JONELLE Julian, MD Date:  01/15/2024

## 2024-01-16 LAB — PREPARE RBC - SUNQUEST
ANTIBODY SCREEN: NEGATIVE
BLOOD EXPIRATION DATE: 202
BLOOD EXPIRATION DATE: 202
BLOOD EXPIRATION DATE: 202
BLOOD EXPIRATION DATE: 202
BLOOD EXPIRATION DATE: 202
BLOOD EXPIRATION DATE: 202
BLOOD EXPIRATION DATE: 202
BLOOD EXPIRATION DATE: 202
BLOOD EXPIRATION DATE: 202
CODING STATUS: 510
CODING STATUS: 510
CODING STATUS: 510
CODING STATUS: 510
CODING STATUS: 510
CODING STATUS: 510
CODING STATUS: 620
CODING STATUS: 620
CODING STATUS: 620
ISSUE DATE TIME: 202
ISSUE DATE TIME: 202
ISSUE DATE TIME: 202
ISSUE DATE TIME: 202
ISSUE DATE TIME: 202
ISSUE DATE TIME: 202
ISSUE DATE TIME: 202
ISSUE DATE TIME: 202
ISSUE DATE TIME: 202
PRODUCT CODE: 0
PRODUCT CODE: 0
PRODUCT CODE: 0
PRODUCT CODE: 0
PRODUCT CODE: 0
PRODUCT CODE: 0
PRODUCT CODE: 0
PRODUCT CODE: 0
PRODUCT CODE: 0
UNIT DIVISION: 0
UNIT DIVISION: 0
UNIT DIVISION: 0
UNIT DIVISION: 0
UNIT DIVISION: 0
UNIT DIVISION: 0
UNIT DIVISION: 0
UNIT DIVISION: 0
UNIT DIVISION: 0
UNIT DIVISION: 0
UNIT DIVISION: 0
UNIT DIVISION: 0
UNIT DIVISION: 0
UNIT DIVISION: 0
UNIT DIVISION: 0
UNITS ORDERED: 52

## 2024-01-16 LAB — PREPARE PLASMA (FFP) MTP
BLOOD EXPIRATION DATE: 202
BLOOD EXPIRATION DATE: 202
BLOOD EXPIRATION DATE: 202
BLOOD EXPIRATION DATE: 202
CODING STATUS: 620
CODING STATUS: 620
CODING STATUS: 620
CODING STATUS: 620
CODING STATUS: 620 mmol/L (ref 3.5–5.1)
ISSUE DATE TIME: 202
ISSUE DATE TIME: 202
ISSUE DATE TIME: 202
ISSUE DATE TIME: 202
ISSUE DATE TIME: 202 mmol/L (ref 137–147)
PRODUCT CODE: 0
PRODUCT CODE: 0
PRODUCT CODE: 0
PRODUCT CODE: 0
UNIT DIVISION: 0
UNIT DIVISION: 0
UNIT DIVISION: 0

## 2024-01-16 LAB — CULTURE-BLOOD W/SENSITIVITY

## 2024-01-16 LAB — CBC AND DIFF
~~LOC~~ BKR ABSOLUTE BASO COUNT: 0 10*3/uL (ref 0.00–0.20)
~~LOC~~ BKR ABSOLUTE EOS COUNT: 0.1 10*3/uL (ref 0.00–0.45)
~~LOC~~ BKR ABSOLUTE LYMPH COUNT: 0.4 10*3/uL — ABNORMAL LOW (ref 1.00–4.80)
~~LOC~~ BKR ABSOLUTE MONO COUNT: 0.7 10*3/uL (ref 0.00–0.80)
~~LOC~~ BKR ABSOLUTE NEUTROPHIL: 7.6 10*3/uL — ABNORMAL HIGH (ref 1.80–7.00)
~~LOC~~ BKR BASOPHILS %: 0.3 % — ABNORMAL LOW (ref 0.0–2.0)
~~LOC~~ BKR EOSINOPHILS %: 0.9 % (ref 0.0–5.0)
~~LOC~~ BKR LYMPHOCYTES %: 5 % — ABNORMAL LOW (ref 24.0–44.0)
~~LOC~~ BKR MCH: 29 pg — ABNORMAL LOW (ref 26.0–34.0)
~~LOC~~ BKR MCHC: 34 g/dL — ABNORMAL LOW (ref 32.0–36.0)
~~LOC~~ BKR MCV: 85 fL — ABNORMAL HIGH (ref 80.0–100.0)
~~LOC~~ BKR MDW (MONOCYTE DISTRIBUTION WIDTH): 25 — ABNORMAL HIGH (ref <=20.6)
~~LOC~~ BKR MONOCYTES %: 7.8 % (ref 4.0–12.0)
~~LOC~~ BKR MPV: 8.5 fL (ref 7.0–11.0)
~~LOC~~ BKR PLATELET COUNT: 107 10*3/uL — ABNORMAL LOW (ref 150–400)
~~LOC~~ BKR RDW: 15 % (ref 11.0–15.0)

## 2024-01-16 LAB — BLOOD GASES, ARTERIAL
~~LOC~~ BKR BASE DEFICIT-ART: 1.9 mmol/L (ref 21–30)
~~LOC~~ BKR O2 SAT-ART: 99 % — ABNORMAL HIGH (ref 95.0–99.0)
~~LOC~~ BKR PCO2-ART: 36 mmHg — ABNORMAL HIGH (ref 35–45)
~~LOC~~ BKR PO2-ART: 141 mmHg — ABNORMAL HIGH (ref 80–100)

## 2024-01-16 LAB — COMPREHENSIVE METABOLIC PANEL
~~LOC~~ BKR AST: 31 U/L — ABNORMAL HIGH (ref 7–40)
~~LOC~~ BKR BLD UREA NITROGEN: 29 mg/dL — ABNORMAL HIGH (ref 7–25)
~~LOC~~ BKR CHLORIDE: 110 mmol/L — ABNORMAL LOW (ref 98–110)
~~LOC~~ BKR GLUCOSE, RANDOM: 103 mg/dL — ABNORMAL HIGH (ref 70–100)
~~LOC~~ BKR POTASSIUM: 4.7 mmol/L (ref 3.5–5.1)
~~LOC~~ BKR SODIUM, SERUM: 139 mmol/L (ref 137–147)

## 2024-01-16 LAB — CBC
~~LOC~~ BKR HEMATOCRIT: 28 % — ABNORMAL LOW (ref 40.0–50.0)
~~LOC~~ BKR HEMOGLOBIN: 10 g/dL — ABNORMAL LOW (ref 13.5–16.5)
~~LOC~~ BKR MCH: 30 pg (ref 26.0–34.0)
~~LOC~~ BKR MCHC: 35 g/dL (ref 32.0–36.0)
~~LOC~~ BKR MCV: 86 fL (ref 80.0–100.0)
~~LOC~~ BKR MPV: 8.5 fL (ref 7.0–11.0)
~~LOC~~ BKR PLATELET COUNT: 106 10*3/uL — ABNORMAL LOW (ref 150–400)
~~LOC~~ BKR RBC COUNT: 3.3 10*6/uL — ABNORMAL LOW (ref 4.40–5.50)
~~LOC~~ BKR RDW: 14 % (ref 11.0–15.0)
~~LOC~~ BKR WBC COUNT: 7.9 10*3/uL (ref 4.50–11.00)

## 2024-01-16 LAB — BASIC METABOLIC PANEL
~~LOC~~ BKR ANION GAP: 8 (ref 3–12)
~~LOC~~ BKR BLD UREA NITROGEN: 26 mg/dL — ABNORMAL HIGH (ref 7–25)
~~LOC~~ BKR CALCIUM: 8 mg/dL — ABNORMAL LOW (ref 8.5–10.6)
~~LOC~~ BKR CHLORIDE: 108 mmol/L (ref 98–110)
~~LOC~~ BKR CO2: 24 mmol/L (ref 21–30)
~~LOC~~ BKR CREATININE: 1.5 mg/dL — ABNORMAL HIGH (ref 0.40–1.24)
~~LOC~~ BKR GLOMERULAR FILTRATION RATE (GFR): 58 mL/min — ABNORMAL LOW (ref >60)
~~LOC~~ BKR GLUCOSE, RANDOM: 107 mg/dL — ABNORMAL HIGH (ref 70–100)
~~LOC~~ BKR POTASSIUM: 4.2 mmol/L (ref 3.5–5.1)
~~LOC~~ BKR SODIUM, SERUM: 140 mmol/L (ref 137–147)
~~LOC~~ BKR SODIUM, SERUM: 142 mmol/L — ABNORMAL LOW (ref 137–147)

## 2024-01-16 LAB — TYPE & CROSSMATCH: ~~LOC~~ BKR ANTIBODY SCREEN: NEGATIVE mmol/L (ref 98–110)

## 2024-01-16 LAB — PREPARE APHERESIS PLATELETS MTP
UNIT DIVISION: 0
UNITS ORDERED: 6

## 2024-01-16 MED ORDER — POTASSIUM CHLORIDE IN WATER 10 MEQ/50 ML IV PGBK
10 meq | INTRAVENOUS | 0 refills | Status: CP
Start: 2024-01-16 — End: ?
  Administered 2024-01-17: 05:00:00 10 meq via INTRAVENOUS

## 2024-01-16 MED ORDER — POTASSIUM CHLORIDE IN WATER 10 MEQ/50 ML IV PGBK
10 meq | INTRAVENOUS | 0 refills | Status: CP
Start: 2024-01-16 — End: ?
  Administered 2024-01-17: 03:00:00 10 meq via INTRAVENOUS

## 2024-01-16 MED ORDER — POTASSIUM CHLORIDE IN WATER 10 MEQ/50 ML IV PGBK
10 meq | INTRAVENOUS | 0 refills | Status: CP
Start: 2024-01-16 — End: ?
  Administered 2024-01-17: 01:00:00 10 meq via INTRAVENOUS

## 2024-01-16 MED ORDER — HYDRALAZINE 20 MG/ML IJ SOLN
10 mg | Freq: Once | INTRAVENOUS | 0 refills | Status: CP
Start: 2024-01-16 — End: ?

## 2024-01-16 MED ORDER — POTASSIUM CHLORIDE IN WATER 10 MEQ/50 ML IV PGBK
10 meq | INTRAVENOUS | 0 refills | Status: CP
Start: 2024-01-16 — End: ?
  Administered 2024-01-17: 08:00:00 10 meq via INTRAVENOUS

## 2024-01-16 MED ORDER — LABETALOL 5 MG/ML IV SOLN
10 mg | Freq: Once | INTRAVENOUS | 0 refills | Status: CP
Start: 2024-01-16 — End: ?

## 2024-01-16 MED ORDER — METOPROLOL SUCCINATE 50 MG PO TB24
50 mg | Freq: Every day | ORAL | 0 refills | Status: DC
Start: 2024-01-16 — End: 2024-01-19
  Administered 2024-01-16 – 2024-01-19 (×4): 50 mg via ORAL

## 2024-01-16 MED ORDER — LABETALOL 5 MG/ML IV SOLN
20 mg | Freq: Once | INTRAVENOUS | 0 refills | Status: CP
Start: 2024-01-16 — End: ?
  Administered 2024-01-16: 12:00:00 20 mg via INTRAVENOUS

## 2024-01-16 MED ADMIN — DEXMEDETOMIDINE IN 0.9 % NACL 400 MCG/100 ML (4 MCG/ML) IV SOLN [317250]: 0.3 ug/kg/h | INTRAVENOUS | @ 01:00:00 | Stop: 2024-01-16 | NDC 00409159601

## 2024-01-16 MED ADMIN — HYDRALAZINE 20 MG/ML IJ SOLN [3697]: 10 mg | INTRAVENOUS | @ 11:00:00 | Stop: 2024-01-16 | NDC 63323061400

## 2024-01-16 MED ADMIN — LABETALOL 5 MG/ML IV SOLN [10372]: 10 mg | INTRAVENOUS | @ 11:00:00 | Stop: 2024-01-16 | NDC 36000032001

## 2024-01-16 NOTE — Progress Notes
 Interventional Radiology Follow Up Note    Admission Date: 01/10/2024  LOS: 6 days                      Principal Problem:    Hematemesis  Active Problems:    Gastrointestinal hemorrhage    Marginal ulcer    On mechanically assisted ventilation (CMS-HCC)    Acute blood loss anemia    Hematemesis      Procedure completed: Danny Kidd is a 43 y.o. male who is status post mesenteric angiogram with coil embolization on 01/15/24.   A distal branch of the left gastric was coiled to hemostasis and the entire right gastric artery was coiled to hemostasis     Assessment:  - pt is resting in bed, family is at bedside to translate  - right groin site CDI, soft, nontender, no bruising or swelling noted  - hgb stable post procedure, 10.4 today  - bilateral DP 3+, bilateral feet cool to touch, R> L- pt denies any pain, numbness or tingling at feet  - cr 2.04 today    Plan:  1. Further follow up not necessary with IR.     2. Monitor puncture site for s/s bleeding, hematoma.    3. Pain control per ordering provider.    4. Dressing can be removed after 24 hours at which time it is ok to shower. Do not submerge site for 1 week.    5. Do not lift more than 10lbs for 1 week.          We appreciate being able to participate in this patient's care. Please page with any questions or concerns.    Olam CHRISTELLA Robins, APRN-NP   [facility]  Pgr [phone]    IR Team Pager [phone] (After-hours and Weekends)  ________________________________________________________________________    Subjective  Patient resting in bed, denies any further hematemesis, reports possible bloody BMs this am.     Review of Systems  Constitutional: negative for fevers and chills  Respiratory: negative  Gastrointestinal: positive for melena, negative for nausea, vomiting, and abdominal pain    Medications  Scheduled Meds:[Held by Provider] gabapentin  (NEURONTIN ) capsule 300 mg, 300 mg, Oral, TID  lidocaine  (LIDODERM ) 5 % topical patch 1 patch, 1 patch, Topical, QDAY  metoprolol  succinate XL (TOPROL  XL) tablet 50 mg, 50 mg, Oral, QDAY  [Held by Provider] nicotine  (NICODERM CQ ) 14 mg/day patch 1 patch, 1 patch, Transdermal, QDAY  pantoprazole  (PROTONIX ) injection 40 mg, 40 mg, Intravenous, API(88-78)  rosuvastatin  (CRESTOR ) tablet 20 mg, 20 mg, Oral, QDAY    Continuous Infusions:  PRN and Respiratory Meds:acetaminophen  Q4H PRN, aluminum-magnesium  hydroxide Q4H PRN, cetirizine QDAY PRN, magnesium  oxide PRN **OR** magnesium  sulfate PRN (On Call from Rx), [Held by Provider] melatonin QHS PRN, nalOXone PRN, ondansetron  Q6H PRN **OR** ondansetron  Q6H PRN, [Held by Provider] oxyCODONE  Q6H PRN, polyethylene glycol 3350 QDAY PRN, prochlorperazine  edisylate Q6H PRN, sennosides-docusate sodium QDAY PRN      Objective                       Vital Signs: Last Filed                 Vital Signs: 24 Hour Range   BP: 129/77 (09/27 0900)  Temp: 37.4 ?C (99.4 ?F) (09/27 0830)  Pulse: 84 (09/27 0900)  Respirations: 23 PER MINUTE (09/27 0900)  SpO2: 97 % (09/27 0900)  O2%: 30 % (09/27 0900)  O2 Device: None (Room  air) (09/27 0900)  O2 Liter Flow: 4 Lpm (09/27 0600) BP: (79-197)/(37-111)   ABP: (138-206)/(71-94)   Temp:  [36.9 ?C (98.4 ?F)-38.2 ?C (100.8 ?F)]   Pulse:  [64-121]   Respirations:  [0 PER MINUTE-29 PER MINUTE]   SpO2:  [95 %-100 %]   O2%:  [30 %-100 %]   O2 Device: None (Room air)  O2 Liter Flow: 4 Lpm   Intensity Pain Scale (Self Report): 3 (01/16/24 0900) Vitals:    01/10/24 2010 01/11/24 0415 01/14/24 0800   Weight: 80.8 kg (178 lb 2.1 oz) 80.1 kg (176 lb 9.4 oz) 80.1 kg (176 lb 9.4 oz)         Intake/Output Summary:  (Last 24 hours)    Intake/Output Summary (Last 24 hours) at 01/16/2024 1018  Last data filed at 01/16/2024 0800  Gross per 24 hour   Intake 7537.33 ml   Output 3677 ml   Net 3860.33 ml           Physical Exam  General appearance: alert, cooperative, and no distress  Neurologic: Grossly normal  Lungs: nonlabored, RA  Heart: regular rate and rhythm  Abdomen: soft, nontender  Extremities: see above    Lab/Radiology/Other Diagnostic Tests:  Labs:  24-hour labs:    Results for orders placed or performed during the hospital encounter of 01/10/24 (from the past 24 hours)   POC GLUCOSE    Collection Time: 01/15/24 11:15 AM   Result Value Ref Range    Glucose, POC 105 (H) 70 - 100 mg/dL   HEMOGLOBIN & HEMATOCRIT    Collection Time: 01/15/24 11:31 AM   Result Value Ref Range    Hemoglobin 8.2 (L) 13.5 - 16.5 g/dL    Hematocrit 76.7 (L) 40.0 - 50.0 %   POC BLOOD GAS VEN    Collection Time: 01/15/24 11:48 AM   Result Value Ref Range    PH-VEN-POC 7.40 7.30 - 7.40    PCO2-VEN-POC 39 36 - 50 mm[Hg]    PO2-VEN-POC 17 (L) 33 - 48 mm[Hg]    Base Def-VEN-POC 1 mmol/L    O2 Sat-VEN-POC 25 (L) 55 - 71 %    Bicarbonate-VEN- POC 24 mmol/L   POC HEMATOCRIT&HEMOGLOBIN    Collection Time: 01/15/24 11:48 AM   Result Value Ref Range    Hemoglobin POC 5.8 (LL) 13.5 - 16.5 g/dL    Hematocrit POC 17 (L) 40 - 50 %   POC SODIUM    Collection Time: 01/15/24 11:48 AM   Result Value Ref Range    SODIUM-POC 143 137 - 147 mmol/L   POC POTASSIUM    Collection Time: 01/15/24 11:48 AM   Result Value Ref Range    Potassium-POC 4.0 3.5 - 5.1 mmol/L   POC LACTATE    Collection Time: 01/15/24 11:51 AM   Result Value Ref Range    LACTIC ACID POC 0.8 0.5 - 2.0 mmol/L   PREPARE APHERESIS PLATELETS MTP    Collection Time: 01/15/24 11:53 AM   Result Value Ref Range    Units Ordered 6     Record Check FOUND     Unit Number [id number]     Blood Component Type       APHERESIS PLT,LEUKO REDUCED, BACTERIAL MONITOR 7D, 2ND CONT    Unit Division 00     Status OF Unit TRANSFUSED     ISSUE DATE TIME [id number]     PRODUCT CODE [id number]     BLOOD TYPE A NEG  CODING STATUS 0600     BLOOD EXPIRATION DATE 797490717640     Transfusion Status OK TO TRANSFUSE     Unit Number [id number]     Blood Component Type       APHERESIS PLT,LEUKO REDUCED, BACTERIAL MONITOR 7D, 1ST CONT    Unit Division 00     Status OF Unit REL FROM [facility]     Transfusion Status OK TO TRANSFUSE    PREPARE PLASMA (FFP) MTP    Collection Time: 01/15/24 11:53 AM   Result Value Ref Range    Units Ordered 48     Record Check FOUND     Unit Number [id number]     Blood Component Type THAWED PLASMA     Unit Division 00     Status OF Unit TRANSFUSED     ISSUE DATE TIME [id number]     PRODUCT CODE [id number]     BLOOD TYPE A POS     CODING STATUS 6200     BLOOD EXPIRATION DATE 797490697640     Transfusion Status OK TO TRANSFUSE     Unit Number [id number]     Blood Component Type THAWED PLASMA     Unit Division 00     Status OF Unit TRANSFUSED     ISSUE DATE TIME 797490738842     PRODUCT CODE [id number]     BLOOD TYPE A POS     CODING STATUS 6200     BLOOD EXPIRATION DATE 797490697640     Transfusion Status OK TO TRANSFUSE     Unit Number [id number]     Blood Component Type THAWED PLASMA     Unit Division 00     Status OF Unit TRANSFUSED     ISSUE DATE TIME 797490738842     PRODUCT CODE [id number]     BLOOD TYPE A POS     CODING STATUS 6200     BLOOD EXPIRATION DATE 797490697640     Transfusion Status OK TO TRANSFUSE     Unit Number [id number]     Blood Component Type THAWED PLASMA     Unit Division 00     Status OF Unit TRANSFUSED     ISSUE DATE TIME 797490738842     PRODUCT CODE [id number]     BLOOD TYPE A POS     CODING STATUS 6200     BLOOD EXPIRATION DATE 797490697640     Transfusion Status OK TO TRANSFUSE     Unit Number [id number]     Blood Component Type THAWED PLASMA     Unit Division 00     Status OF Unit TRANSFUSED     ISSUE DATE TIME 797490738842     PRODUCT CODE [id number]     BLOOD TYPE A POS     CODING STATUS 6200     BLOOD EXPIRATION DATE 797490697640     Transfusion Status OK TO TRANSFUSE     Unit Number [id number]     Blood Component Type THAWED PLASMA     Unit Division 00     Status OF Unit TRANSFUSED     ISSUE DATE TIME 797490738842     PRODUCT CODE [id number]     BLOOD TYPE A POS     CODING STATUS 6200     BLOOD EXPIRATION DATE 797490697640 Transfusion Status OK TO TRANSFUSE     Unit Number [id number]     Blood Component Type APHERESIS PLASMA THAWED     Unit Division C0     Status  OF Unit REL FROM [facility]     Transfusion Status OK TO TRANSFUSE     Unit Number [id number]     Blood Component Type APHERESIS PLASMA THAWED     Unit Division B0     Status OF Unit REL FROM [facility]     Transfusion Status OK TO TRANSFUSE     Unit Number [id number]     Blood Component Type APHERESIS PLASMA THAWED     Unit Division B0     Status OF Unit REL FROM [facility]     Transfusion Status OK TO TRANSFUSE     Unit Number [id number]     Blood Component Type APHERESIS PLASMA THAWED     Unit Division C0     Status OF Unit REL FROM [facility]     Transfusion Status OK TO TRANSFUSE     Unit Number [id number]     Blood Component Type APHERESIS PLASMA THAWED     Unit Division A0     Status OF Unit REL FROM [facility]     Transfusion Status OK TO TRANSFUSE     Unit Number [id number]     Blood Component Type APHERESIS PLASMA THAWED     Unit Division C0     Status OF Unit REL FROM [facility]     Transfusion Status OK TO TRANSFUSE    PROTIME INR (PT)    Collection Time: 01/15/24 12:20 PM   Result Value Ref Range    Protime 14.7 (H) 9.9 - 14.2 Seconds    INR 1.3 (H) 0.9 - 1.2   PTT (APTT)    Collection Time: 01/15/24 12:20 PM   Result Value Ref Range    APTT 20.6 (L) 24.0 - 36.5 Seconds   FIBRINOGEN    Collection Time: 01/15/24 12:20 PM   Result Value Ref Range    Fibrinogen 151 (L) 200 - 400 mg/dL   D-DIMER    Collection Time: 01/15/24 12:20 PM   Result Value Ref Range    D-Dimer 663 (H) <=500 ng/mL FEU   LACTIC ACID (BG - RAPID LACTATE)    Collection Time: 01/15/24 12:28 PM   Result Value Ref Range    Lactic Acid,BG 1.4 0.5 - 2.0 mmol/L   HEMOGLOBIN & HEMATOCRIT    Collection Time: 01/15/24 12:28 PM   Result Value Ref Range    Hemoglobin 8.0 (L) 13.5 - 16.5 g/dL    Hematocrit 76.1 (L) 40.0 - 50.0 %   SODIUM    Collection Time: 01/15/24 12:28 PM   Result Value Ref Range    Sodium 140 137 - 147 mmol/L   POTASSIUM    Collection Time: 01/15/24 12:28 PM   Result Value Ref Range    Potassium 4.5 3.5 - 5.1 mmol/L   GLUCOSE, RANDOM    Collection Time: 01/15/24 12:28 PM   Result Value Ref Range    Glucose 189 (H) 70 - 100 mg/dL   PROTIME INR (PT)    Collection Time: 01/15/24 12:28 PM   Result Value Ref Range    Protime 16.9 (H) 9.9 - 14.2 Seconds    INR 1.5 (H) 0.9 - 1.2   PTT (APTT)    Collection Time: 01/15/24 12:28 PM   Result Value Ref Range    APTT 25.1 24.0 - 36.5 Seconds   PLATELET COUNT    Collection Time: 01/15/24 12:28 PM   Result Value Ref Range    Platelet Count 178 150 - 400 10*3/uL   FIBRINOGEN    Collection  Time: 01/15/24 12:28 PM   Result Value Ref Range    Fibrinogen 136 (L) 200 - 400 mg/dL   D-DIMER    Collection Time: 01/15/24 12:28 PM   Result Value Ref Range    D-Dimer 371 <=500 ng/mL FEU   IONIZED CALCIUM    Collection Time: 01/15/24 12:28 PM   Result Value Ref Range    Ionized Calcium 0.97 (L) 1.00 - 1.30 mmol/L   TEG WITH KAOLIN    Collection Time: 01/15/24 12:28 PM   Result Value Ref Range    MA Kaolin 54.2 50.0 - 70.0 mm    R Kaolin 3.9 3.0 - 9.0 min    K Kaolin 4.1 (H) 1.0 - 3.0 min    RK Kaolin 8.0 4.0 - 12.0 min    Angle Kaolin 45.9 (L) 55.0 - 75.0 deg    Lysis30 0.0 0.0 - 8.0 %   POC BLOOD GAS VEN    Collection Time: 01/15/24  1:15 PM   Result Value Ref Range    PH-VEN-POC 7.26 (L) 7.30 - 7.40    PCO2-VEN-POC 49 36 - 50 mm[Hg]    PO2-VEN-POC 44 33 - 48 mm[Hg]    Base Def-VEN-POC 5 mmol/L    O2 Sat-VEN-POC 72 (H) 55 - 71 %    Bicarbonate-VEN- POC 22 mmol/L   POC HEMATOCRIT&HEMOGLOBIN    Collection Time: 01/15/24  1:15 PM   Result Value Ref Range    Hemoglobin POC 9.2 (L) 13.5 - 16.5 g/dL    Hematocrit POC 27 (L) 40 - 50 %   POC SODIUM    Collection Time: 01/15/24  1:15 PM   Result Value Ref Range    SODIUM-POC 140 137 - 147 mmol/L   POC POTASSIUM    Collection Time: 01/15/24  1:15 PM   Result Value Ref Range    Potassium-POC 5.9 (H) 3.5 - 5.1 mmol/L   POC BLOOD GAS ARTERIAL Collection Time: 01/15/24  1:54 PM   Result Value Ref Range    PH-ART-POC 7.39 7.35 - 7.45    PCO2-ART-POC 38 35 - 45 mm[Hg]    PO2-ART-POC 402 (H) 80 - 100 mm[Hg]    Base Def-ART-POC 2 mmol/L    O2 Sat-ART-POC 100 (H) 95 - 99 %    Bicarbonate-ART-POC 23 21 - 28 mmol/L   POC HEMATOCRIT&HEMOGLOBIN    Collection Time: 01/15/24  1:54 PM   Result Value Ref Range    Hemoglobin POC 8.8 (L) 13.5 - 16.5 g/dL    Hematocrit POC 26 (L) 40 - 50 %   POC SODIUM    Collection Time: 01/15/24  1:54 PM   Result Value Ref Range    SODIUM-POC 138 137 - 147 mmol/L   POC POTASSIUM    Collection Time: 01/15/24  1:54 PM   Result Value Ref Range    Potassium-POC 5.4 (H) 3.5 - 5.1 mmol/L   SODIUM    Collection Time: 01/15/24  1:56 PM   Result Value Ref Range    Sodium 139 137 - 147 mmol/L   POTASSIUM    Collection Time: 01/15/24  1:56 PM   Result Value Ref Range    Potassium 5.3 (H) 3.5 - 5.1 mmol/L   GLUCOSE, RANDOM    Collection Time: 01/15/24  1:56 PM   Result Value Ref Range    Glucose 198 (H) 70 - 100 mg/dL   IONIZED CALCIUM    Collection Time: 01/15/24  1:56 PM   Result Value Ref Range  Ionized Calcium 1.01 1.00 - 1.30 mmol/L   LACTIC ACID (BG - RAPID LACTATE)    Collection Time: 01/15/24  1:56 PM   Result Value Ref Range    Lactic Acid,BG 1.1 0.5 - 2.0 mmol/L   TEG WITH KAOLIN    Collection Time: 01/15/24  1:56 PM   Result Value Ref Range    MA Kaolin 59.8 50.0 - 70.0 mm    R Kaolin 1.8 (L) 3.0 - 9.0 min    K Kaolin 1.3 1.0 - 3.0 min    RK Kaolin 3.1 (L) 4.0 - 12.0 min    Angle Kaolin 74.1 55.0 - 75.0 deg    Lysis30 0.1 0.0 - 8.0 %   POC GLUCOSE    Collection Time: 01/15/24  1:56 PM   Result Value Ref Range    Glucose, POC 182 (H) 70 - 100 mg/dL   POC BLOOD GAS ARTERIAL    Collection Time: 01/15/24  2:32 PM   Result Value Ref Range    PH-ART-POC 7.40 7.35 - 7.45    PCO2-ART-POC 37 35 - 45 mm[Hg]    PO2-ART-POC 232 (H) 80 - 100 mm[Hg]    Base Def-ART-POC 2 mmol/L    O2 Sat-ART-POC 100 (H) 95 - 99 %    Bicarbonate-ART-POC 23 21 - 28 mmol/L   POC IONIZED CALCIUM    Collection Time: 01/15/24  2:32 PM   Result Value Ref Range    Ionized Calcium-POC 1.08 1.00 - 1.30 mg/dL   POC HEMATOCRIT&HEMOGLOBIN    Collection Time: 01/15/24  2:32 PM   Result Value Ref Range    Hemoglobin POC 8.5 (L) 13.5 - 16.5 g/dL    Hematocrit POC 25 (L) 40 - 50 %   POC SODIUM    Collection Time: 01/15/24  2:32 PM   Result Value Ref Range    SODIUM-POC 139 137 - 147 mmol/L   POC POTASSIUM    Collection Time: 01/15/24  2:32 PM   Result Value Ref Range    Potassium-POC 4.4 3.5 - 5.1 mmol/L   POC GLUCOSE    Collection Time: 01/15/24  2:38 PM   Result Value Ref Range    Glucose, POC 161 (H) 70 - 100 mg/dL   COMPREHENSIVE METABOLIC PANEL    Collection Time: 01/15/24  3:21 PM   Result Value Ref Range    Sodium 139 137 - 147 mmol/L    Potassium 3.7 3.5 - 5.1 mmol/L    Chloride 109 98 - 110 mmol/L    Glucose 156 (H) 70 - 100 mg/dL    Blood Urea Nitrogen 18 7 - 25 mg/dL    Creatinine 9.26 9.59 - 1.24 mg/dL    Calcium 7.4 (L) 8.5 - 10.6 mg/dL    Total Protein 4.3 (L) 6.0 - 8.0 g/dL    Total Bilirubin 1.8 (H) 0.2 - 1.3 mg/dL    Albumin 2.8 (L) 3.5 - 5.0 g/dL    Alk Phosphatase 39 25 - 110 U/L    AST 44 (H) 7 - 40 U/L    ALT 36 7 - 56 U/L    CO2 25 21 - 30 mmol/L    Anion Gap 5 3 - 12    Glomerular Filtration Rate (GFR) >60 >60 mL/min   POC GLUCOSE    Collection Time: 01/15/24  3:23 PM   Result Value Ref Range    Glucose, POC 147 (H) 70 - 100 mg/dL   POC BLOOD GAS ARTERIAL    Collection Time:  01/15/24  3:24 PM   Result Value Ref Range    PH-ART-POC 7.43 7.35 - 7.45    PCO2-ART-POC 35 35 - 45 mm[Hg]    PO2-ART-POC 181 (H) 80 - 100 mm[Hg]    Base Def-ART-POC 1 mmol/L    O2 Sat-ART-POC 100 (H) 95 - 99 %    Bicarbonate-ART-POC 23 21 - 28 mmol/L   POC IONIZED CALCIUM    Collection Time: 01/15/24  3:24 PM   Result Value Ref Range    Ionized Calcium-POC 1.11 1.00 - 1.30 mg/dL   POC HEMATOCRIT&HEMOGLOBIN    Collection Time: 01/15/24  3:24 PM   Result Value Ref Range    Hemoglobin POC 8.5 (L) 13.5 - 16.5 g/dL    Hematocrit POC 25 (L) 40 - 50 %   POC SODIUM    Collection Time: 01/15/24  3:24 PM   Result Value Ref Range    SODIUM-POC 140 137 - 147 mmol/L   POC POTASSIUM    Collection Time: 01/15/24  3:24 PM   Result Value Ref Range    Potassium-POC 3.7 3.5 - 5.1 mmol/L   PTT (APTT)    Collection Time: 01/15/24  3:48 PM   Result Value Ref Range    APTT 27.8 24.0 - 36.5 Seconds   D-DIMER    Collection Time: 01/15/24  3:48 PM   Result Value Ref Range    D-Dimer 15,736 (H) <=500 ng/mL FEU   FIBRINOGEN    Collection Time: 01/15/24  3:48 PM   Result Value Ref Range    Fibrinogen 179 (L) 200 - 400 mg/dL   PROTIME INR (PT)    Collection Time: 01/15/24  3:48 PM   Result Value Ref Range    Protime 14.2 9.9 - 14.2 Seconds    INR 1.3 (H) 0.9 - 1.2   BLOOD GASES, ARTERIAL    Collection Time: 01/15/24  4:27 PM   Result Value Ref Range    pH 7.42 7.35 - 7.45    pCO2-Arterial 37 35 - 45 mmHg    pO2-Arterial 172 (H) 80 - 100 mmHg    Base Excess-Arterial 0.1 mmol/L    O2 SAT-Arterial 98.4 95.0 - 99.0 %    Bicarbonate-ART-Cal 24.6 21.0 - 28.0 mmol/L    FiO2 Value Arterial 40 %   HEMOGLOBIN & HEMATOCRIT, BG    Collection Time: 01/15/24  4:27 PM   Result Value Ref Range    Hemoglobin 9.0 (L) 13.5 - 16.5 g/dL    Hematocrit 71.9 (L) 40.0 - 50.0 %   SODIUM,BG    Collection Time: 01/15/24  4:27 PM   Result Value Ref Range    Sodium 139 137 - 147 mmol/L   POTASSIUM, BG    Collection Time: 01/15/24  4:27 PM   Result Value Ref Range    Potassium 3.4 (L) 3.5 - 5.1 mmol/L   GLUCOSE,BG    Collection Time: 01/15/24  4:27 PM   Result Value Ref Range    Glucose Blood Gas 133 (H) 70 - 100 mg/dL   IONIZED CALCIUM,BG    Collection Time: 01/15/24  4:27 PM   Result Value Ref Range    Ionized Calcium 1.10 1.00 - 1.30 mmol/L   CBC    Collection Time: 01/15/24  4:27 PM   Result Value Ref Range    White Blood Cells 7.70 4.50 - 11.00 10*3/uL    Red Blood Cells 3.12 (L) 4.40 - 5.50 10*6/uL    Hemoglobin 9.2 (L) 13.5 - 16.5 g/dL  Hematocrit 26.4 (L) 40.0 - 50.0 %    MCV 84.8 80.0 - 100.0 fL    MCH 29.6 26.0 - 34.0 pg    MCHC 34.9 32.0 - 36.0 g/dL    RDW 85.6 88.9 - 84.9 %    Platelet Count 120 (L) 150 - 400 10*3/uL    MPV 8.6 7.0 - 11.0 fL   COMPREHENSIVE METABOLIC PANEL    Collection Time: 01/15/24  4:50 PM   Result Value Ref Range    Sodium 141 137 - 147 mmol/L    Potassium 3.6 3.5 - 5.1 mmol/L    Chloride 110 98 - 110 mmol/L    Glucose 138 (H) 70 - 100 mg/dL    Blood Urea Nitrogen 21 7 - 25 mg/dL    Creatinine 9.23 9.59 - 1.24 mg/dL    Calcium 7.7 (L) 8.5 - 10.6 mg/dL    Total Protein 4.3 (L) 6.0 - 8.0 g/dL    Total Bilirubin 1.8 (H) 0.2 - 1.3 mg/dL    Albumin 2.7 (L) 3.5 - 5.0 g/dL    Alk Phosphatase 39 25 - 110 U/L    AST 38 7 - 40 U/L    ALT 35 7 - 56 U/L    CO2 24 21 - 30 mmol/L    Anion Gap 7 3 - 12    Glomerular Filtration Rate (GFR) >60 >60 mL/min   MAGNESIUM     Collection Time: 01/15/24  4:50 PM   Result Value Ref Range    Magnesium  1.6 1.6 - 2.6 mg/dL   PHOSPHORUS    Collection Time: 01/15/24  4:50 PM   Result Value Ref Range    Phosphorus 3.8 2.0 - 4.5 mg/dL   CBC    Collection Time: 01/15/24 10:15 PM   Result Value Ref Range    White Blood Cells 7.90 4.50 - 11.00 10*3/uL    Red Blood Cells 3.32 (L) 4.40 - 5.50 10*6/uL    Hemoglobin 10.1 (L) 13.5 - 16.5 g/dL    Hematocrit 71.4 (L) 40.0 - 50.0 %    MCV 86.0 80.0 - 100.0 fL    MCH 30.6 26.0 - 34.0 pg    MCHC 35.5 32.0 - 36.0 g/dL    RDW 85.0 88.9 - 84.9 %    Platelet Count 106 (L) 150 - 400 10*3/uL    MPV 8.5 7.0 - 11.0 fL   BASIC METABOLIC PANEL    Collection Time: 01/15/24 11:30 PM   Result Value Ref Range    Sodium 140 137 - 147 mmol/L    Potassium 4.2 3.5 - 5.1 mmol/L    Chloride 108 98 - 110 mmol/L    Glucose 107 (H) 70 - 100 mg/dL    Blood Urea Nitrogen 26 (H) 7 - 25 mg/dL    Creatinine 8.47 (H) 0.40 - 1.24 mg/dL    Calcium 8.0 (L) 8.5 - 10.6 mg/dL    CO2 24 21 - 30 mmol/L    Anion Gap 8 3 - 12    Glomerular Filtration Rate (GFR) 58 (L) >60 mL/min   COMPREHENSIVE METABOLIC PANEL    Collection Time: 01/16/24  2:50 AM   Result Value Ref Range    Sodium 139 137 - 147 mmol/L    Potassium 4.7 3.5 - 5.1 mmol/L    Chloride 110 98 - 110 mmol/L    Glucose 103 (H) 70 - 100 mg/dL    Blood Urea Nitrogen 29 (H) 7 - 25 mg/dL    Creatinine 7.95 (H)  0.40 - 1.24 mg/dL    Calcium 8.2 (L) 8.5 - 10.6 mg/dL    Total Protein 5.0 (L) 6.0 - 8.0 g/dL    Total Bilirubin 0.9 0.2 - 1.3 mg/dL    Albumin 3.1 (L) 3.5 - 5.0 g/dL    Alk Phosphatase 44 25 - 110 U/L    AST 31 7 - 40 U/L    ALT 33 7 - 56 U/L    CO2 22 21 - 30 mmol/L    Anion Gap 7 3 - 12    Glomerular Filtration Rate (GFR) 41 (L) >60 mL/min   CBC AND DIFF    Collection Time: 01/16/24  2:50 AM   Result Value Ref Range    White Blood Cells 8.90 4.50 - 11.00 10*3/uL    Red Blood Cells 3.49 (L) 4.40 - 5.50 10*6/uL    Hemoglobin 10.4 (L) 13.5 - 16.5 g/dL    Hematocrit 69.9 (L) 40.0 - 50.0 %    MCV 85.9 80.0 - 100.0 fL    MCH 29.8 26.0 - 34.0 pg    MCHC 34.7 32.0 - 36.0 g/dL    RDW 84.9 88.9 - 84.9 %    Platelet Count 107 (L) 150 - 400 10*3/uL    MPV 8.5 7.0 - 11.0 fL    Neutrophils 86.0 (H) 41.0 - 77.0 %    Lymphocytes 5.0 (L) 24.0 - 44.0 %    Monocytes 7.8 4.0 - 12.0 %    Eosinophils 0.9 0.0 - 5.0 %    Basophils 0.3 0.0 - 2.0 %    Absolute Neutrophil Count 7.60 (H) 1.80 - 7.00 10*3/uL    Absolute Lymph Count 0.40 (L) 1.00 - 4.80 10*3/uL    Absolute Monocyte Count 0.70 0.00 - 0.80 10*3/uL    Absolute Eosinophil Count 0.10 0.00 - 0.45 10*3/uL    Absolute Basophil Count 0.00 0.00 - 0.20 10*3/uL    MDW (Monocyte Distribution Width) 25.8 (H) <=20.6   MAGNESIUM     Collection Time: 01/16/24  2:50 AM   Result Value Ref Range    Magnesium  1.9 1.6 - 2.6 mg/dL   PHOSPHORUS    Collection Time: 01/16/24  2:50 AM   Result Value Ref Range    Phosphorus 4.1 2.0 - 4.5 mg/dL   BLOOD GASES, ARTERIAL    Collection Time: 01/16/24  2:50 AM   Result Value Ref Range    pH 7.40 7.35 - 7.45    pCO2-Arterial 36 35 - 45 mmHg    pO2-Arterial 141 (H) 80 - 100 mmHg    Base Deficit-Arterial 1.9 mmol/L    O2 SAT-Arterial 99.3 (H) 95.0 - 99.0 %    Bicarbonate-ART-Cal 22.8 21.0 - 28.0 mmol/L    FiO2 Value Arterial 30 %     Radiology: Reviewed.

## 2024-01-16 NOTE — Progress Notes
 Critical Care   Progress Note     Danny Kidd  Today's Date:  01/16/2024  Admission Date: 01/10/2024  LOS: 6 days    Principal Problem:    Hematemesis  Active Problems:    Gastrointestinal hemorrhage    Marginal ulcer    On mechanically assisted ventilation (CMS-HCC)    Acute blood loss anemia    Hematemesis        Assessment/Plan:      Hospital Course: Danny Kidd is a 43 y.o. male with a PMH of: Roux-en-Y gastric bypass, HFpEF, HTN, papillary thyroid carcinoma s/p thyroidectomy & T2DM. Presented to [facility] on 9/21 for hematemesis & abd pain. Underwent EGD on 9/24 that showed one superficial esophageal ulcer w/ no stigmata of recent bleeding was found at the [facility]. On 9/26 developed acute hematemesis requiring emergent intubation & MTP activation. S/p multiple blood products & emergently to IR now s/p successful coiling L & R gastric artery anastomosis. Admitted to MICU following IR for further management. Extubated 9/27. Downgraded floor status 9/27.     NEURO  - PTA gabapentin    - Emergently intubated 9/26  - On exam AO   PLAN  - Hold PTA gabapentin       PULM  Acute Hypoxic Respiratory Failure   - No O2 needs at baseline  - CXR 9/21: stable mild cardiomegaly without acute cardiopulmonary abnormality.   - Emergently intubated during RR 9/26  - Extubated 9/27   - Currently 4LNC   PLAN  - Hold nicotine  patch     CV  Shock (resolved)   HFpEF  HTN  - PTA metoprolol  100mg , Lasix  40mg  , Crestor    - TTE 12/26/2022: moderately dilated LV EF of 50%. Indeterminate LV diastolic dysfunction. CVP 10 to 20 mmHg.   - Shock in the setting of acute GIB s/p NE & vaso gtts   - Pressors off 9/26 upon successful coiling in IR   - HR 80s & SBP 200s   PLAN  - Restart metoprolol  XL at 50% dose     GI/Heme   Hematemesis & Hematochezia 2/2 Upper GI Bleed   Acute Blood Loss Anemia   History of Roux-en-Y Gastric Bypass (03/2023)   - Source of upper GI bleed most likely secondary to ulcer, No evidence of localized bowel perforation.  - CT angiogram abdomen/pelvis 01/10/2024: Prior bowel [place] gastric bypass with fat stranding between the gastrojejunostomy and anterior aspect of the pancreatic body jejunum adjacent to gastrojejunostomy anastomotic site.  There is no intraperitoneal fluid collection, significant ascites, or pneumoperitoneum.   - EGD 9/23: one superficial esophageal ulcer with no bleeding was found 41cm from the incisors. The lesion was 3mm in largest dimesnion. 1ch hiatal hernia was present. 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 Biopsies were taken via cold forcepts and sent to pathology.   - EGD 9/24: e/o a Roux-en-Y gastrojejunostomy was found. The gastrojejunal anastomosis was characterized by ulceration on the jejunal side. Positioning was difficult w/ continued slippage. Few flat red spots were seen. Another non-pulsating bulge was also evaluated without obvious vessel. Second endoscopist also viewed this area. Friability along the borders, but no active bleeding. Coagulation for bleeding prevention using bipolar probe was successful and Nexpowder then applied   - Acute hematemesis requiring emergent intubation & MTP activation s/p IR successful coiling of L & R gastric artery anastomosis  - Last BM 9/27 - black/red  PLAN  - GI consulted & following   - CBC BID   - PPI BID  - CLD  - Zofran  PRN     RENAL  - Baseline Cr 0.76  - Cr 2.04   PLAN  - Strict I/Os  - BMP Q8     ENDO  T2DM  - 01/2023 A1C 8.5%  - Pt not taking metformin  as per endocrinologist a few months ago. Was taking metformin  1000mg  BID prior  - 110lb wt loss since roux en Y 03/2023  - BG WNL  PLAN  - LDCF     ID  Leukocytosis (resolved)   - Mild elevated WBC on admission to 13.9, most likely reactive  - BC 9/21 NGTD  - WBC 8.9, afebrile   PLAN  - Monitor off abx    Prophylaxis Review:  Lines: Yes PIV & cordis  Tubes: NA  Insulin : Yes  Urinary Catheter: Yes  VTE ppx: NA; SCDs  GI ppx: PPI    Code status: FULL  Disposition: Tele      Pt discussed with Dr. Suanne ROEL Solian, APRN   Pulmonary Critical Care   Pager [phone]   01/16/2024    M4 team pager (2nd call/nights) [phone]    __________________________________________________________________________________    Subjective:        Danny Kidd is a 43 y.o. male who AO, denies pain, reports wanting his foley & ART line removed. No questions or concerns this AM.     Objective:        Medications:  Scheduled Meds:[Held by Provider] gabapentin  (NEURONTIN ) capsule 300 mg, 300 mg, Oral, TID  lidocaine  (LIDODERM ) 5 % topical patch 1 patch, 1 patch, Topical, QDAY  [Held by Provider] nicotine  (NICODERM CQ ) 14 mg/day patch 1 patch, 1 patch, Transdermal, QDAY  pantoprazole  (PROTONIX ) injection 40 mg, 40 mg, Intravenous, BID(11-21)  petrolatum (STYE) ophthalmic ointment 0.25 inch, 0.25 inch, Both Eyes, Q6H  rosuvastatin  (CRESTOR ) tablet 20 mg, 20 mg, Oral, QDAY  SODIUM CHLORIDE  0.9% IV SOLP (Cabinet Override), , , NOW    Continuous Infusions:   dexMEDEtomidine  (PRECEDEX ) 400 mcg/NS 100 ml IV drip (premade) Stopped (01/16/24 0447)    fentaNYL  (SUBLIMAZE ) 1000 mcg/100 mL NS IV drip (std conc)(premade) 40 mcg/hr (01/16/24 0452)    propofoL  (DIPRIVAN ) 10 mg/mL IV drip 40 mcg/kg/min (01/16/24 0452)     PRN and Respiratory Meds:acetaminophen  Q4H PRN, aluminum-magnesium  hydroxide Q4H PRN, cetirizine QDAY PRN, fentaNYL  TITRATE **AND** fentaNYL  Q30 MIN PRN, magnesium  oxide PRN **OR** magnesium  sulfate PRN (On Call from Rx), [Held by Provider] melatonin QHS PRN, nalOXone PRN, ondansetron  Q6H PRN **OR** ondansetron  Q6H PRN, [Held by Provider] oxyCODONE  Q6H PRN, polyethylene glycol 3350 QDAY PRN, prochlorperazine  edisylate Q6H PRN, sennosides-docusate sodium QDAY PRN                       Vital Signs: Last Filed                  Vital Signs: 24 Hour Range   BP: 154/76 (09/26 1318)  Temp: 37.4 ?C (99.3 ?F) (09/27 0400)  Pulse: 86 (09/27 0534)  Respirations: 14 PER MINUTE (09/27 0534)  SpO2: 100 % (09/27 0534)  O2%: 30 % (09/27 0534)  O2 Device: High flow nasal cannula (09/27 0534)  O2 Liter Flow: 4 Lpm (09/27 0534) BP: (79-154)/(37-111)   ABP: (138-202)/(71-94)   Temp:  [36.6 ?C (97.9 ?F)-37.8 ?C (100 ?F)]   Pulse:  [64-121]  Respirations:  [0 PER MINUTE-29 PER MINUTE]   SpO2:  [95 %-100 %]   O2%:  [30 %-100 %]   O2 Device: High flow nasal cannula  O2 Liter Flow: 4 Lpm     Vitals:    01/10/24 2010 01/11/24 0415 01/14/24 0800   Weight: 80.8 kg (178 lb 2.1 oz) 80.1 kg (176 lb 9.4 oz) 80.1 kg (176 lb 9.4 oz)           Intake/Output Summary:  (Last 24 hours)    Intake/Output Summary (Last 24 hours) at 01/16/2024 0551  Last data filed at 01/16/2024 0400  Gross per 24 hour   Intake 8384.45 ml   Output 527 ml   Net 7857.45 ml         Physical Exam:         General: Cooperative, no distress, appears stated age  Lungs:  Clear upper & diminished lower bilaterally; no wheeze  Heart:  Regular rate and rhythm  Abdomen:  Soft, non-tender.  Bowel sounds normal  Neurologic:  A&O    Artificial airway:  None                                                                                       Ventilator/ Respiratory Therapy:  No     Vent weaning trial:  Not applicable    Laboratory:  LABS:  Recent Labs     01/14/24  0122 01/15/24  0010 01/15/24  0422 01/15/24  1228 01/15/24  1356 01/15/24  1521 01/15/24  1627 01/15/24  1650 01/15/24  2330 01/16/24  0250   NA 140 140 138 140 139 139 139 141 140 139    K 3.3* 3.7 4.4 4.5 5.3* 3.7 3.4* 3.6 4.2 4.7   CL 107 106 106  --   --  109  --  110 108 110   CO2 25 28 27   --   --  25  --  24 24 22    GAP 8 6 5   --   --  5  --  7 8 7    BUN 19 11 16   --   --  18  --  21 26* 29*   CR 0.79 0.87 0.76  --   --  0.73  --  0.76 1.52* 2.04*   GLU 87 131* 118* 189* 198* 156* 133* 138* 107* 103*   CA 8.1* 8.6 7.9*  --   --  7.4*  --  7.7* 8.0* 8.2*   ALBUMIN 3.1*  --  2.8*  --   --  2.8*  --  2.7*  -- 3.1*   MG  --   --   --   --   --   --   --  1.6  --  1.9   PO4  --   --   --   --   --   --   --  3.8  --  4.1       Recent Labs     01/13/24  0629 01/13/24  1323 01/13/24  1930 01/14/24  0122 01/14/24  0654 01/14/24  1547 01/14/24  2109 01/15/24  0010  01/15/24  0422 01/15/24  1131 01/15/24  1220 01/15/24  1228 01/15/24  1521 01/15/24  1548 01/15/24  1627 01/15/24  1650 01/15/24  2215 01/16/24  0250   WBC  --  6.70 6.30 6.50 5.90 6.80 7.80 6.20 6.40  --   --   --   --   --  7.70  --  7.90 8.90   HGB 6.3* 8.5* 8.5* 8.3* 8.0* 9.2* 10.2* 7.8* 5.8* 8.2*  --  8.0*  --   --  9.2*  --  10.1* 10.4*   HCT 17.7* 23.7* 24.1* 23.4* 22.4* 26.2* 29.4* 22.5* 16.7* 23.2*  --  23.8*  --   --  26.4*  --  28.5* 30.0*   PLTCT  --  184 164 165 169 195 247 218 167  --   --  178  --   --  120*  --  106* 107*   PT  --   --   --   --   --   --   --   --   --   --  14.7* 16.9*  --  14.2  --   --   --   --    INR  --   --   --   --   --   --   --   --   --   --  1.3* 1.5*  --  1.3*  --   --   --   --    PTT  --   --   --   --   --   --   --   --   --   --  20.6* 25.1  --  27.8  --   --   --   --    AST  --   --   --  10  --   --   --   --  16  --   --   --  44*  --   --  38  --  31   ALT  --   --   --  7  --   --   --   --  14  --   --   --  36  --   --  35  --  33   ALKPHOS  --   --   --  26  --   --   --   --  28  --   --   --  39  --   --  39  --  44      Estimated Creatinine Clearance: 46.6 mL/min (A) (by C-G formula based on SCr of 2.04 mg/dL (H)).  Vitals:    01/10/24 2010 01/11/24 0415 01/14/24 0800   Weight: 80.8 kg (178 lb 2.1 oz) 80.1 kg (176 lb 9.4 oz) 80.1 kg (176 lb 9.4 oz)      Recent Labs     01/15/24  1627 01/16/24  0250   PHART 7.42 7.40   PO2ART 172* 141*           Radiology and Other Diagnostic Procedures Review:    Reviewed     Malnutrition   Does not meet criteria

## 2024-01-16 NOTE — Progress Notes
 Adult Mechanical Ventilator Liberation    Name: Danny Kidd   MRN: [id number]     DOB: 1980/05/23      Age: 43 y.o.  Admission Date: 01/10/2024     LOS: 6 days     Date of Service: 01/16/2024        Adult Mechanical Ventilator Liberation: Exempt     Modified RASS Score: +1 Restless  Weaning Protocol Ordered (RT Only):: Exempt  Weaning Readiness Screen Met (RT Only):: Yes  Initial Weaning Minute Volume (Lpm): 8.05 L/min  Initial Weaning Respiratory Rate: 18 breaths/min  Initial Weaning VT (mL)(calc): 447 mL  Initial RSBI (Calculated): 40  Spontaneous Breathing Trial Completed (RT Only): (S) No, unstable BP (< or equal to 60 mm Hg MAP or > or equal to 180 mm Hg systolic) (provider wishes to extubate patient)  Post Weaning Minute Volume (Lpm): 11.5 L/min  Post Weaning Respiratory Rate: 16 breaths/min  Post Weaning VT (mL)(calc): 719 mL  NIF Ventilated (cmH2O): -34 cm H2O  $$ Vital Capacity (mL): 1300 ml  Post RSBI (calc): 22  RSBI Ratio: -45  Weaning Trial Comment: provider wishes to extubate patient

## 2024-01-17 NOTE — Progress Notes
 Float Pool END OF SHIFT/ JHFRAT NOTE    Admission Date: 01/10/2024    Acute events, interventions, provider communication: Removed Cordis. Hgb up to 10 in afternoon.    Patient Interventions and Education  Fall Risk/JHFRAT Interventions and Education: (Charting when applicable)  Elimination Interventions : Commode at bedside, Place male/male urinal within reach , and Bed pan available in room  Medications : Bedside commode (i.e., urgency, frequency, dizziness) , Stay within arm's reach during toileting/showering (i.e., dizziness, orthostasis) , Educate patient on medication side effects, and Bed/chair alarm (i.e., change in mental status)   Patient Care Equipment: Does not need assistance with patient care equipment when ambulating, Ensure environment is free of clutter and walkways are clear from tripping hazards, and Assess need for patient equipment and remove if not in use  Mobility: Assist x1, Gait belt in use when ambulating, and Elimination equipment at bedside (urinal or commode)   Cognition: Bed/Chair Alarm  and Stay within arm's reach while patient ambulating/toileting/showering  Risk for Moderate/Major Injury: N/A    2. Restraints:  No     Restraint Goal: Patient will be free from injury while physically restrained.  See Docflowsheet for restraint documentation, interventions, education, etc.    Intake and Output:       Intake/Output Summary (Last 24 hours) at 01/17/2024 1858  Last data filed at 01/17/2024 1500  Gross per 24 hour   Intake 1130 ml   Output 2258 ml   Net -1128 ml              Last Bowel Movement Date: 01/17/24

## 2024-01-17 NOTE — Transfer Summaries
 In-Hospital Transfer Note    Admission Diagnosis:  Hematemesis   Admission Date: 01/10/2024  Active Hospital Problem List:  Principal Problem:    Hematemesis  Active Problems:    Gastrointestinal hemorrhage    Marginal ulcer    On mechanically assisted ventilation (CMS-HCC)    Acute blood loss anemia    Hematemesis    Hospital Course:  Danny Kidd is a 43 y.o. male with a PMH of: Roux-en-Y gastric bypass, HFpEF, HTN, papillary thyroid carcinoma s/p thyroidectomy & T2DM. Presented to [facility] on 9/21 for hematemesis & abd pain. Underwent EGD on 9/24 that showed one superficial esophageal ulcer w/ no stigmata of recent bleeding was found at the [facility]. On 9/26 developed acute hematemesis requiring emergent intubation & MTP activation. S/p multiple blood products & emergently to IR now s/p successful coiling L & R gastric artery anastomosis. Admitted to MICU following IR for further management. Extubated 9/27. Downgraded floor status 9/27.   Significant Medication Information (to include antibiotic duration/indication, anticoagulation and steroids, etc.):    - PPI BID   - Metoprolol  XL - at 50% dose of PTA regimen   Procedures With Dates: Noted above   Consults: IR & GI   Follow-Up Items:  NA   Activity/Weight bearing status:  PT/OT   Nutrition:  Regular diet   Discharge Plan:  TBD   Lynwood SHAUNNA Solian IV, APRN-NP  Pulmonary Critical Care   On voalte

## 2024-01-17 NOTE — Progress Notes
 Critical Care   Progress Note     Danny Kidd  Today's Date:  01/18/2024  Admission Date: 01/10/2024  LOS: 8 days    Principal Problem:    Hematemesis  Active Problems:    Gastrointestinal hemorrhage    Marginal ulcer    On mechanically assisted ventilation (CMS-HCC)    Acute blood loss anemia    Hematemesis      Assessment/Plan:      Brief Hospital Course:  Danny Kidd is a 43 y.o. male with a PMH of Roux-en-Y gastric bypass, HFpEF, HTN, papillary thyroid carcinoma s/p thyroidectomy & T2DM. Presented to [facility] on 9/21 for hematemesis & abd pain. Underwent EGD on 9/24 that showed one superficial esophageal ulcer w/ no stigmata of recent bleeding at the [facility]. On 9/26 developed acute hematemesis requiring emergent intubation & MTP activation. S/p multiple blood products & emergently to IR now s/p successful coiling L & R gastric artery anastomosis. Admitted to MICU following IR for further management. Extubated 9/27. Downgraded floor status 9/27. Possible discharge home 9/30.     NEURO  - On exam AO   - Continue PTA gabapentin  300mg  TID     PULM  Acute Hypoxic Respiratory Failure (resolved)   - No O2 needs at baseline  - CXR 9/21: stable mild cardiomegaly without acute cardiopulmonary abnormality.   - Emergently intubated during RR 9/26  - Extubated 9/27   - Currently RA   PLAN  - Hold nicotine  patch     CV  Shock (resolved)   HFpEF  HTN  - PTA metoprolol  100mg , Lasix  40mg  , Crestor    - TTE 12/26/2022: moderately dilated LV EF of 50%. Indeterminate LV diastolic dysfunction. CVP 10 to 20 mmHg.   - Shock in the setting of acute GIB s/p NE & vaso gtts   - Pressors off 9/26 upon successful coiling in IR   - HR 50-70s & SBP 130-140s  PLAN  - Continue metoprolol  XL at 50% dose - 50mg  daily  - Continue PTA statin  - Holding PTA lasix      GI/Heme   Hematemesis & Hematochezia 2/2 Upper GI Bleed   Acute Blood Loss Anemia   History of Roux-en-Y Gastric Bypass (03/2023)   - Source of upper GI bleed most likely secondary to ulcer, No evidence of localized bowel perforation.  - CTA abdomen/pelvis 01/10/2024: Prior Roux-en-Y gastric bypass with fat stranding between the gastrojejunostomy and anterior aspect of the pancreatic body jejunum adjacent to gastrojejunostomy anastomotic site. There is no intraperitoneal fluid collection, significant ascites, or pneumoperitoneum.   - EGD 9/23: one superficial esophageal ulcer with no bleeding was found 41cm from the incisors. The lesion was 3mm in largest dimesnion. 1cm hiatal hernia. 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 Biopsies were taken via cold forcepts and sent to pathology.   - EGD 9/24: e/o a Roux-en-Y gastrojejunostomy was found. The gastrojejunal anastomosis was characterized by ulceration on the jejunal side. Positioning was difficult w/ continued slippage. Few flat red spots were seen. Another non-pulsating bulge was also evaluated without obvious vessel. Second endoscopist also viewed this area. Friability along the borders, but no active bleeding. Coagulation for bleeding prevention using bipolar probe was successful and Nexpowder then applied   - Acute hematemesis requiring emergent intubation & MTP activation   - S/p IR 9/26: successful coiling of L & R gastric artery anastomosis  - Last BM 9/28 -  black/red   - Hgb 8.9, plt 116  PLAN  - Daily CBC  - Restart DVT ppx   - PPI BID  - Regular diet  - Zofran /compazine  PRN  - Bowel regimen PRN     RENAL  AKI (improved)  - 2/2 blood loss   - Baseline Cr 0.8  - Cr peaked at 2.04, improving  - Cr 0.84  PLAN  - Strict I/Os     ENDO  T2DM  - 01/2023 A1C 8.5%  - Pt not taking metformin  as per endocrinologist a few months ago. Was taking metformin  1000mg  BID prior  - 110lb wt loss since roux en Y 03/2023  - BG WNL  PLAN  - Monitor on labs     ID  Leukocytosis (resolved)   - Mild elevated WBC on admission to 13.9, most likely reactive  - Blood cx 9/21: negative  - WBC 6.2, afebrile - last fever 9/27  PLAN  - Monitor off abx    Prophylaxis Review:  Lines: Peripheral Line  Tubes: None  Insulin : No  VTE ppx: Lovenox ; SCDs  GI ppx: Protonix   PT/OT: Yes    Code status: Full  Disposition: Stable to transfer to the floor    Maudie Reilly, APRN   Pulmonary/Critical Care  Pager [phone]  01/18/2024  M4 team pager [phone]  __________________________________________________________________________________    Subjective:     Danny Kidd is a 43 y.o. male whowas resting in bed this morning, no complaints. Discussed possibly going home tomorrow which he was agreeable to.     Objective:   Medications:  Scheduled Meds:gabapentin  (NEURONTIN ) capsule 300 mg, 300 mg, Oral, TID  lidocaine  (LIDODERM ) 5 % topical patch 1 patch, 1 patch, Topical, QDAY  metoprolol  succinate XL (TOPROL  XL) tablet 50 mg, 50 mg, Oral, QDAY  [Held by Provider] nicotine  (NICODERM CQ ) 14 mg/day patch 1 patch, 1 patch, Transdermal, QDAY  pantoprazole  (PROTONIX ) injection 40 mg, 40 mg, Intravenous, API(88-78)  rosuvastatin  (CRESTOR ) tablet 20 mg, 20 mg, Oral, QDAY    Continuous Infusions:  PRN and Respiratory Meds:acetaminophen  Q4H PRN, aluminum-magnesium  hydroxide Q4H PRN, benzonatate TID PRN, cetirizine QDAY PRN, magnesium  oxide PRN **OR** magnesium  sulfate PRN (On Call from Rx), melatonin QHS PRN, nalOXone PRN, ondansetron  Q6H PRN **OR** ondansetron  Q6H PRN, polyethylene glycol 3350 QDAY PRN, prochlorperazine  edisylate Q6H PRN, sennosides-docusate sodium QDAY PRN                       Vital Signs: Last Filed                  Vital Signs: 24 Hour Range   BP: 130/78 (09/29 0400)  Temp: 36.8 ?C (98.3 ?F) (09/29 0400)  Pulse: 58 (09/29 0400)  Respirations: 20 PER MINUTE (09/29 0400)  SpO2: 97 % (09/29 0400)  O2%: 30 % (09/28 2000)  O2 Device: None (Room air) (09/29 0400) BP: (130-146)/(78-92)   Temp:  [36.8 ?C (98.2 ?F)-36.9 ?C (98.4 ?F)]   Pulse: [58-69]   Respirations:  [11 PER MINUTE-22 PER MINUTE]   SpO2:  [97 %-98 %]   O2%:  [30 %]   O2 Device: None (Room air)     Vitals:    01/10/24 2010 01/11/24 0415 01/14/24 0800   Weight: 80.8 kg (178 lb 2.1 oz) 80.1 kg (176 lb 9.4 oz) 80.1 kg (176 lb 9.4 oz)           Intake/Output Summary:  (Last 24 hours)    Intake/Output Summary (  Last 24 hours) at 01/18/2024 9286  Last data filed at 01/18/2024 0600  Gross per 24 hour   Intake 930 ml   Output 1501 ml   Net -571 ml         Physical Exam:      General:  Alert, in no acute distress  Head:  Normocephalic  Eyes:  Conjunctivae/corneas clear.   Lungs:  Clear to auscultation bilaterally  Heart:   Regular rate and rhythm  Abdomen:  Soft, non-tender.  Extremities: no edema  Skin: warm, dry.   Neurologic:  A/o x 4, moves all extremities, no focal deficit.    Artificial airway:  None                                                                                         Ventilator/ Respiratory Therapy:  No     Vent weaning trial:  Not applicable    Laboratory:  LABS:  Recent Labs     01/15/24  1228 01/15/24  1356 01/15/24  1521 01/15/24  1627 01/15/24  1650 01/15/24  2330 01/16/24  0250 01/16/24  1013 01/16/24  1743 01/17/24  0246 01/18/24  0414   NA 140 139 139 139 141 140 139 146 142 143  139   K 4.5 5.3* 3.7 3.4* 3.6 4.2 4.7 3.9 3.6 3.7 3.7   CL  --   --  109  --  110 108 110 112* 108 109 107   CO2  --   --  25  --  24 24 22 25 26 27 26    GAP  --   --  5  --  7 8 7 9 8 7 6    BUN  --   --  18  --  21 26* 29* 29* 24 20 17    CR  --   --  0.73  --  0.76 1.52* 2.04* 2.04* 1.63* 1.32* 0.84   GLU 189* 198* 156* 133* 138* 107* 103* 120* 103* 90 85   CA  --   --  7.4*  --  7.7* 8.0* 8.2* 8.5 7.9* 7.9* 8.3*   ALBUMIN  --   --  2.8*  --  2.7*  --  3.1*  --   --  3.0* 3.0*   MG  --   --   --   --  1.6  --  1.9  --   --  1.8 1.7   PO4  --   --   --   --  3.8  --  4.1  --   --  5.6* 3.8       Recent Labs     01/15/24  1131 01/15/24  1220 01/15/24  1228 01/15/24  1521 01/15/24  1548 01/15/24  1627 01/15/24  1650 01/15/24  2215 01/16/24  0250 01/16/24  1552 01/17/24  0246 01/17/24  1614 01/18/24  0414   WBC  --   --   --   --   --  7.70  --  7.90 8.90 8.60 6.40 7.40 6.20   HGB 8.2*  --  8.0*  --   --  9.2*  --  10.1* 10.4* 9.5* 8.8* 10.0* 8.9*   HCT 23.2*  --  23.8*  --   --  26.4*  --  28.5* 30.0* 27.9* 25.4* 29.3* 25.5*   PLTCT  --   --  178  --   --  120*  --  106* 107* 119* 113* 134* 116*   PT  --  14.7* 16.9*  --  14.2  --   --   --   --   --   --   --   --    INR  --  1.3* 1.5*  --  1.3*  --   --   --   --   --   --   --   --    PTT  --  20.6* 25.1  --  27.8  --   --   --   --   --   --   --   --    AST  --   --   --  44*  --   --  38  --  31  --  15  --  16   ALT  --   --   --  36  --   --  35  --  33  --  18  --  18   ALKPHOS  --   --   --  39  --   --  39  --  44  --  39  --  35      Estimated Creatinine Clearance: 113.1 mL/min (by C-G formula based on SCr of 0.84 mg/dL).  Vitals:    01/10/24 2010 01/11/24 0415 01/14/24 0800   Weight: 80.8 kg (178 lb 2.1 oz) 80.1 kg (176 lb 9.4 oz) 80.1 kg (176 lb 9.4 oz)      Recent Labs     01/15/24  1627 01/16/24  0250   PHART 7.42 7.40   PO2ART 172* 141*           Radiology and Other Diagnostic Procedures Review:    Reviewed

## 2024-01-17 NOTE — Progress Notes
 Critical Care   Progress Note     Danny Kidd  Today's Date:  01/17/2024  Admission Date: 01/10/2024  LOS: 7 days    Principal Problem:    Hematemesis  Active Problems:    Gastrointestinal hemorrhage    Marginal ulcer    On mechanically assisted ventilation (CMS-HCC)    Acute blood loss anemia    Hematemesis        Assessment/Plan:      Hospital Course: Danny Kidd is a 43 y.o. male with a PMH of: Roux-en-Y gastric bypass, HFpEF, HTN, papillary thyroid carcinoma s/p thyroidectomy & T2DM. Presented to [facility] on 9/21 for hematemesis & abd pain. Underwent EGD on 9/24 that showed one superficial esophageal ulcer w/ no stigmata of recent bleeding was found at the [facility]. On 9/26 developed acute hematemesis requiring emergent intubation & MTP activation. S/p multiple blood products & emergently to IR now s/p successful coiling L & R gastric artery anastomosis. Admitted to MICU following IR for further management. Extubated 9/27. Downgraded floor status 9/27.     NEURO  - Emergently intubated 9/26  - Continue PTA gabapentin    - On exam AO      PULM  Acute Hypoxic Respiratory Failure (resolved)   - No O2 needs at baseline  - CXR 9/21: stable mild cardiomegaly without acute cardiopulmonary abnormality.   - Emergently intubated during RR 9/26  - Extubated 9/27   - Currently RA   PLAN  - Hold nicotine  patch     CV  Shock (resolved)   HFpEF  HTN  - PTA metoprolol  100mg , Lasix  40mg  , Crestor    - TTE 12/26/2022: moderately dilated LV EF of 50%. Indeterminate LV diastolic dysfunction. CVP 10 to 20 mmHg.   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  - CT angiogram abdomen/pelvis 01/10/2024: Prior bowel NY gastric bypass with fat stranding between the gastrojejunostomy and anterior aspect of the pancreatic body jejunum adjacent to gastrojejunostomy anastomotic site.  There is no intraperitoneal fluid collection, significant ascites, or pneumoperitoneum.   - EGD 9/23: one superficial esophageal ulcer with no bleeding was found 41cm from the incisors. The lesion was 3mm in largest dimesnion. 1ch hiatal hernia was present. 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 Biopsies were taken via cold forcepts and sent to pathology.   - EGD 9/24: e/o a Roux-en-Y gastrojejunostomy was found. The gastrojejunal anastomosis was characterized by ulceration on the jejunal side. Positioning was difficult w/ continued slippage. Few flat red spots were seen. Another non-pulsating bulge was also evaluated without obvious vessel. Second endoscopist also viewed this area. Friability along the borders, but no active bleeding. Coagulation for bleeding prevention using bipolar probe was successful and Nexpowder then applied   - Acute hematemesis requiring emergent intubation & MTP activation s/p IR successful coiling of L & R gastric artery anastomosis  - Last BM 9/27 - black/red   - Hgb 8.8, plt 113  PLAN  - Daily CBC. Repeat CBC this afternoon to verify continued stability   - PPI BID  - CLD; advance as tolerated   - Zofran  PRN     RENAL  AKI (improving)  - 2/2 blood loss   - Baseline Cr 0.76  - Cr 1.32   PLAN  - Strict I/Os     ENDO  T2DM  - 01/2023 A1C 8.5%  - Pt not taking metformin  as per endocrinologist a few months ago. Was taking metformin  1000mg  BID prior  - 110lb wt loss since roux en Y 03/2023  - BG WNL  PLAN  - LDCF     ID  Leukocytosis (resolved)   - Mild elevated WBC on admission to 13.9, most likely reactive  - BC 9/21 NGTD  - WBC 6.4, afebrile   PLAN  - Monitor off abx    Prophylaxis Review:  Lines: Yes PIV   Tubes: NA  Insulin : Yes  Urinary Catheter: Yes  VTE ppx: NA; SCDs  GI ppx: PPI    Code status: FULL  Disposition: Tele      Pt discussed with Dr. Suanne ROEL Solian, APRN   Pulmonary Critical Care   Pager [phone]   01/17/2024    M4 team pager (2nd call/nights) [phone]    __________________________________________________________________________________    Subjective:        Danny Kidd is a 43 y.o. male who AO, denies pain,in good spirits this AM. No questions this AM.     Objective:        Medications:  Scheduled Meds:[Held by Provider] gabapentin  (NEURONTIN ) capsule 300 mg, 300 mg, Oral, TID  lidocaine  (LIDODERM ) 5 % topical patch 1 patch, 1 patch, Topical, QDAY  metoprolol  succinate XL (TOPROL  XL) tablet 50 mg, 50 mg, Oral, QDAY  [Held by Provider] nicotine  (NICODERM CQ ) 14 mg/day patch 1 patch, 1 patch, Transdermal, QDAY  pantoprazole  (PROTONIX ) injection 40 mg, 40 mg, Intravenous, API(88-78)  rosuvastatin  (CRESTOR ) tablet 20 mg, 20 mg, Oral, QDAY    Continuous Infusions:      PRN and Respiratory Meds:acetaminophen  Q4H PRN, aluminum-magnesium  hydroxide Q4H PRN, cetirizine QDAY PRN, magnesium  oxide PRN **OR** magnesium  sulfate PRN (On Call from Rx), [Held by Provider] melatonin QHS PRN, nalOXone PRN, ondansetron  Q6H PRN **OR** ondansetron  Q6H PRN, [Held by Provider] oxyCODONE  Q6H PRN, polyethylene glycol 3350 QDAY PRN, prochlorperazine  edisylate Q6H PRN, sennosides-docusate sodium QDAY PRN                       Vital Signs: Last Filed                  Vital Signs: 24 Hour Range   BP: 124/69 (09/28 0400)  Temp: 37.3 ?C (99.1 ?F) (09/28 0400)  Pulse: 60 (09/28 0400)  Respirations: 8 PER MINUTE (09/28 0400)  SpO2: 96 % (09/28 0400)  O2%: 30 % (09/28 0400)  O2 Device: None (Room air) (09/28 0400)  O2 Liter Flow: 4 Lpm (09/27 0600) BP: (115-197)/(67-87)   ABP: (198-206)/(71-84)   Temp:  [37.2 ?C (99 ?F)-38.2 ?C (100.8 ?F)]   Pulse: [60-105]   Respirations:  [6 PER MINUTE-23 PER MINUTE]   SpO2:  [96 %-100 %]   O2%:  [30 %]   O2 Device: None (Room air)  O2 Liter Flow: 4 Lpm   Intensity Pain Scale (Self Report): 4 (01/16/24 2204) Vitals:    01/10/24 2010 01/11/24 0415 01/14/24 0800   Weight: 80.8 kg (178  lb 2.1 oz) 80.1 kg (176 lb 9.4 oz) 80.1 kg (176 lb 9.4 oz)           Intake/Output Summary:  (Last 24 hours)    Intake/Output Summary (Last 24 hours) at 01/17/2024 0545  Last data filed at 01/17/2024 0300  Gross per 24 hour   Intake 229.77 ml   Output 6525 ml   Net -6295.23 ml         Physical Exam:         General: Cooperative, no distress, appears stated age  Lungs:  Clear upper & diminished lower bilaterally; no wheeze  Heart:  Regular rate and rhythm  Abdomen:  Soft, non-tender.  Bowel sounds normal  Neurologic:  A&O    Artificial airway:  None                                                                                       Ventilator/ Respiratory Therapy:  No     Vent weaning trial:  Not applicable    Laboratory:  LABS:  Recent Labs     01/15/24  0010 01/15/24  0422 01/15/24  1228 01/15/24  1356 01/15/24  1521 01/15/24  1627 01/15/24  1650 01/15/24  2330 01/16/24  0250 01/16/24  1013 01/16/24  1743 01/17/24  0246   NA 140 138 140 139 139 139 141 140 139 146  142 143   K 3.7 4.4 4.5 5.3* 3.7 3.4* 3.6 4.2 4.7 3.9 3.6 3.7   CL 106 106  --   --  109  --  110 108 110 112* 108 109   CO2 28 27  --   --  25  --  24 24 22 25 26 27    GAP 6 5  --   --  5  --  7 8 7 9 8 7    BUN 11 16  --   --  18  --  21 26* 29* 29* 24 20   CR 0.87 0.76  --   --  0.73  --  0.76 1.52* 2.04* 2.04* 1.63* 1.32*   GLU 131* 118* 189* 198* 156* 133* 138* 107* 103* 120* 103* 90   CA 8.6 7.9*  --   --  7.4*  --  7.7* 8.0* 8.2* 8.5 7.9* 7.9*   ALBUMIN  --  2.8*  --   --  2.8*  --  2.7*  --  3.1*  --   --  3.0*   MG  --   --   --   --   --   --  1.6  --  1.9  --   --  1.8   PO4  --   --   --   --   --   --  3.8  --  4.1  --   --  5.6*       Recent Labs     01/14/24  0654 01/14/24  1547 01/14/24  2109 01/15/24  0010 01/15/24  0422 01/15/24  1131 01/15/24  1220 01/15/24  1228 01/15/24  1521 01/15/24  1548 01/15/24  1627 01/15/24  1650 01/15/24  2215 01/16/24  0250 01/16/24  1552 01/17/24  0246   WBC 5.90 6.80 7.80 6.20 6.40  --   --   --   --   --  7.70  --  7.90 8.90 8.60 6.40   HGB 8.0* 9.2* 10.2* 7.8* 5.8* 8.2*  --  8.0*  --   --  9.2*  --  10.1* 10.4* 9.5* 8.8*   HCT 22.4* 26.2* 29.4* 22.5* 16.7* 23.2*  --  23.8*  --   --  26.4*  --  28.5* 30.0* 27.9* 25.4*   PLTCT 169 195 247 218 167  --   --  178  --   --  120*  --  106* 107* 119* 113*   PT  --   --   --   --   --   --  14.7* 16.9*  --  14.2  --   --   --   --   --   --    INR  --   --   --   --   --   --  1.3* 1.5*  --  1.3*  --   --   --   --   --   --    PTT  --   --   --   --   --   --  20.6* 25.1  --  27.8  --   --   --   --   --   --    AST  --   --   --   --  16  --   --   --  44*  --   --  38  --  31  --  15   ALT  --   --   --   --  14  --   --   --  36  --   --  35  --  33  --  18   ALKPHOS  --   --   --   --  28  --   --   --  39  --   --  39  --  44  --  39      Estimated Creatinine Clearance: 72 mL/min (A) (by C-G formula based on SCr of 1.32 mg/dL (H)).  Vitals:    01/10/24 2010 01/11/24 0415 01/14/24 0800   Weight: 80.8 kg (178 lb 2.1 oz) 80.1 kg (176 lb 9.4 oz) 80.1 kg (176 lb 9.4 oz)      Recent Labs     01/15/24  1627 01/16/24  0250   PHART 7.42 7.40   PO2ART 172* 141*           Radiology and Other Diagnostic Procedures Review:    Reviewed     Malnutrition   Does not meet criteria

## 2024-01-18 MED ORDER — POTASSIUM CHLORIDE 20 MEQ PO TBTQ
40 meq | Freq: Once | ORAL | 0 refills | Status: CP
Start: 2024-01-18 — End: ?
  Administered 2024-01-18: 14:00:00 40 meq via ORAL

## 2024-01-18 MED ORDER — BENZONATATE 100 MG PO CAP
100 mg | Freq: Three times a day (TID) | ORAL | 0 refills | Status: DC | PRN
Start: 2024-01-18 — End: 2024-01-19
  Administered 2024-01-18: 09:00:00 100 mg via ORAL

## 2024-01-18 MED ORDER — ENOXAPARIN 40 MG/0.4 ML SC SYRG
40 mg | Freq: Every day | SUBCUTANEOUS | 0 refills | Status: DC
Start: 2024-01-18 — End: 2024-01-19
  Administered 2024-01-19: 02:00:00 40 mg via SUBCUTANEOUS

## 2024-01-18 NOTE — Progress Notes
 Due to language barrier, an interpreter was present during the history-taking and subsequent discussion (and for part of the physical exam) with this patient.    Interpreter mode: Telephonic  Interpreter/ ID Number: [id number]

## 2024-01-19 ENCOUNTER — Encounter
Admit: 2024-01-19 | Discharge: 2024-01-19 | Payer: PRIVATE HEALTH INSURANCE | Primary: Student in an Organized Health Care Education/Training Program

## 2024-01-19 ENCOUNTER — Inpatient Hospital Stay: Admit: 2024-01-10 | Payer: PRIVATE HEALTH INSURANCE

## 2024-01-19 DIAGNOSIS — E119 Type 2 diabetes mellitus without complications: Secondary | ICD-10-CM

## 2024-01-19 DIAGNOSIS — I110 Hypertensive heart disease with heart failure: Secondary | ICD-10-CM

## 2024-01-19 DIAGNOSIS — Z79899 Other long term (current) drug therapy: Secondary | ICD-10-CM

## 2024-01-19 DIAGNOSIS — Z7984 Long term (current) use of oral hypoglycemic drugs: Secondary | ICD-10-CM

## 2024-01-19 DIAGNOSIS — G4733 Obstructive sleep apnea (adult) (pediatric): Secondary | ICD-10-CM

## 2024-01-19 DIAGNOSIS — Z9884 Bariatric surgery status: Secondary | ICD-10-CM

## 2024-01-19 DIAGNOSIS — K2210 Ulcer of esophagus without bleeding: Secondary | ICD-10-CM

## 2024-01-19 DIAGNOSIS — E785 Hyperlipidemia, unspecified: Secondary | ICD-10-CM

## 2024-01-19 DIAGNOSIS — Z980 Intestinal bypass and anastomosis status: Secondary | ICD-10-CM

## 2024-01-19 DIAGNOSIS — I272 Pulmonary hypertension, unspecified: Secondary | ICD-10-CM

## 2024-01-19 DIAGNOSIS — K449 Diaphragmatic hernia without obstruction or gangrene: Secondary | ICD-10-CM

## 2024-01-19 DIAGNOSIS — F17210 Nicotine dependence, cigarettes, uncomplicated: Secondary | ICD-10-CM

## 2024-01-19 DIAGNOSIS — Z85850 Personal history of malignant neoplasm of thyroid: Secondary | ICD-10-CM

## 2024-01-19 DIAGNOSIS — E890 Postprocedural hypothyroidism: Secondary | ICD-10-CM

## 2024-01-19 DIAGNOSIS — N179 Acute kidney failure, unspecified: Secondary | ICD-10-CM

## 2024-01-19 DIAGNOSIS — K284 Chronic or unspecified gastrojejunal ulcer with hemorrhage: Secondary | ICD-10-CM

## 2024-01-19 DIAGNOSIS — J9601 Acute respiratory failure with hypoxia: Secondary | ICD-10-CM

## 2024-01-19 DIAGNOSIS — Z560 Unemployment, unspecified: Secondary | ICD-10-CM

## 2024-01-19 DIAGNOSIS — Z902 Acquired absence of lung [part of]: Secondary | ICD-10-CM

## 2024-01-19 DIAGNOSIS — E860 Dehydration: Secondary | ICD-10-CM

## 2024-01-19 DIAGNOSIS — I5032 Chronic diastolic (congestive) heart failure: Secondary | ICD-10-CM

## 2024-01-19 DIAGNOSIS — D62 Acute posthemorrhagic anemia: Secondary | ICD-10-CM

## 2024-01-19 DIAGNOSIS — I959 Hypotension, unspecified: Secondary | ICD-10-CM

## 2024-01-19 LAB — URINALYSIS DIPSTICK, COMPLETE
~~LOC~~ BKR LEUKOCYTES: NEGATIVE
~~LOC~~ BKR NITRITE: NEGATIVE
~~LOC~~ BKR URINE ASCORBIC ACID, UA: NEGATIVE
~~LOC~~ BKR URINE BILE: NEGATIVE
~~LOC~~ BKR URINE KETONE: NEGATIVE
~~LOC~~ BKR URINE PH: 6 (ref 5.0–8.0)
~~LOC~~ BKR URINE SPEC GRAVITY: 1 (ref 1.005–1.030)

## 2024-01-19 LAB — URINALYSIS, MICROSCOPIC: ~~LOC~~ BKR WBC, UA: 2 /HPF — AB

## 2024-01-19 MED ORDER — POTASSIUM CHLORIDE 20 MEQ PO TBTQ
60 meq | Freq: Once | ORAL | 0 refills | Status: CP
Start: 2024-01-19 — End: ?
  Administered 2024-01-19: 10:00:00 60 meq via ORAL

## 2024-01-19 MED ORDER — NICOTINE 14 MG/24 HR TD PT24
1 | MEDICATED_PATCH | Freq: Every day | TRANSDERMAL | 0 refills | Status: CN
Start: 2024-01-19 — End: ?

## 2024-01-19 MED ORDER — MAGNESIUM SULFATE IN D5W 1 GRAM/100 ML IV PGBK
1 g | INTRAVENOUS | 0 refills | Status: CP
Start: 2024-01-19 — End: ?
  Administered 2024-01-19: 10:00:00 1 g via INTRAVENOUS

## 2024-01-19 MED ORDER — FUROSEMIDE 40 MG PO TAB
40 mg | Freq: Every morning | ORAL | 0 refills | Status: DC
Start: 2024-01-19 — End: 2024-01-19
  Administered 2024-01-19: 15:00:00 40 mg via ORAL

## 2024-01-19 MED ORDER — GABAPENTIN 300 MG PO CAP
300 mg | Freq: Every evening | ORAL | 0 refills | Status: DC
Start: 2024-01-19 — End: 2024-01-19

## 2024-01-19 MED ORDER — METOPROLOL SUCCINATE 100 MG PO TB24
50 mg | ORAL_TABLET | Freq: Every day | ORAL | 0 refills | 90.00000 days | Status: AC
Start: 2024-01-19 — End: ?
  Filled 2024-01-19: qty 45, 90d supply, fill #0

## 2024-01-19 MED ORDER — GABAPENTIN 300 MG PO CAP
300 mg | Freq: Every evening | ORAL | 0 refills | 30.00000 days | Status: AC
Start: 2024-01-19 — End: ?

## 2024-01-19 MED ORDER — LOSARTAN 50 MG PO TAB
50 mg | Freq: Every day | ORAL | 0 refills | Status: DC
Start: 2024-01-19 — End: 2024-01-19
  Administered 2024-01-19: 15:00:00 50 mg via ORAL

## 2024-01-19 MED ORDER — MAGNESIUM SULFATE IN D5W 1 GRAM/100 ML IV PGBK
1 g | INTRAVENOUS | 0 refills | Status: CP
Start: 2024-01-19 — End: ?
  Administered 2024-01-19: 13:00:00 1 g via INTRAVENOUS

## 2024-01-19 MED ORDER — OMEPRAZOLE 40 MG PO CPDR
40 mg | ORAL_CAPSULE | Freq: Two times a day (BID) | ORAL | 3 refills | 90.00000 days | Status: AC
Start: 2024-01-19 — End: ?
  Filled 2024-01-19: qty 180, 90d supply, fill #0

## 2024-01-19 NOTE — Discharge Instructions - Pharmacy
 Discharge Summary      Name: Danny Kidd  Medical Record Number: [id number]        Account Number:  [id number]  Date Of Birth:  1981-01-19                         Age:  43 y.o.  Admit date:  01/10/2024                     Discharge date: 01/19/2024      Discharge Attending:  Dr Maykol Postigo  Discharge Summary Completed By: Maudie CHRISTELLA Reilly, APRN-NP    Service: Med ICU 4 - [id number]    Reason for hospitalization:  Hematemesis [K92.0]    Primary Discharge Diagnosis:   Hematemesis    Hospital Diagnoses:  Hospital Problems        Active Problems    Marginal ulcer       Resolved Problems    * (Principal) RESOLVED: Hematemesis    RESOLVED: Gastrointestinal hemorrhage    RESOLVED: On mechanically assisted ventilation (CMS-HCC)    RESOLVED: Acute blood loss anemia    RESOLVED: Hematemesis     Present on Admission:  **None**        Significant Past Medical History        Cancer (CMS-HCC)      Comment:  thyroid  DM (diabetes mellitus) (CMS-HCC)  Heart failure with preserved ejection fraction (CMS-HCC)  Hypertension  Lipoma      Comment:  left mid-flank region  Varicose veins of lower extremity    Allergies   Patient has no known allergies.    Brief Hospital Course   The patient was admitted and the following issues were addressed during this hospitalization: (with pertinent details including admission exam/imaging/labs).        Danny Kidd is a 43 y.o. male with a PMH of Roux-en-Y gastric bypass, HFpEF, HTN, papillary thyroid carcinoma s/p thyroidectomy & T2DM. Presented to [facility] on 9/21 for hematemesis & abd pain. Underwent EGD on 9/24 that showed one superficial esophageal ulcer w/ no stigmata of recent bleeding at the GE[facility]. On 9/26 developed acute hematemesis requiring emergent intubation & MTP activation. S/p multiple blood products & emergently to IR now s/p successful coiling L & R gastric artery anastomosis. Admitted to MICU following IR for further management. Extubated 9/27. Has remained hemodynamically stable. Discharged home on PPI BID w/ plan for GI follow up scheduled for 12/26.    Day of discharge exam notable for: a/o, still having black BMs, having some blood in urine    Items Needing Follow Up   Pending items or areas that need to be addressed at follow up: None    Pending Labs and Follow Up Radiology    Pending labs and/or radiology review at this time of discharge are listed below: Please note- any labs with collected status will not have a result; if this area is blank, there are no items for review.   Pending Labs       Order Current Status    BLOOD GASES, ARTERIAL Collected (01/15/24 1627)    BLOOD GASES, ARTERIAL Collected (01/15/24 1627)    BLOOD GASES, ARTERIAL Collected (01/15/24 1627)    EXTRA URINE GRAY TOP Collected (01/19/24 1202)    EXTRA CLEAR URINE TUBE In process    URINALYSIS, COMPLETE In process              Medications  Medication List      START taking these medications     omeprazole DR 40 mg capsule; Commonly known as: PriLOSEC; Dose: 40 mg;   Take one capsule by mouth twice daily. Indications: bleeding from stomach,   esophagus or duodenum; For: bleeding from stomach, esophagus or duodenum;   Quantity: 180 capsule; Refills: 3     CHANGE how you take these medications     metoprolol  succinate XL 100 mg extended release tablet; Commonly known   as: TOPROL  XL; Dose: 50 mg; Doctor's comments: Indications: chronic heart   failure, high blood pressure; Tome 0.5 tabletas por v?a oral diariamente.;   (Take one-half tablet by mouth daily.); For: chronic heart failure, high   blood pressure; Quantity: 90 tablet; Refills: 0; What changed: how much to   take     CONTINUE taking these medications     acetaminophen  SR 650 mg tablet; Commonly known as: TYLENOL  ARTHRITIS   PAIN; Dose: 650 mg; Take one tablet by mouth every 8 hours as needed for   Pain. Indications: pain; For: pain; Refills: 0   BARIATRIC MULTIVITAMINS PO; Dose: 1 capsule; Refills: 0   cetirizine 10 mg tablet; Commonly known as: ZyrTEC; Dose: 10 mg;   Refills: 0   furosemide  40 mg tablet; Commonly known as: LASIX ; Dose: 40 mg; Tome una   tableta por v?a oral cada ma?ana.; (Take one tablet by mouth every   morning.); For: accumulation of fluid resulting from chronic heart   failure, fluid in the lungs due to chronic heart failure; Quantity: 90   tablet; Refills: 0   losartan  50 mg tablet; Commonly known as: COZAAR ; Dose: 50 mg; Tome una   tableta por v?a oral diariamente.; (Take one tablet by mouth daily.); For:   chronic heart failure; Quantity: 30 tablet; Refills: 0   MELATONIN PO; Refills: 0   oxyCODONE  5 mg tablet; Commonly known as: ROXICODONE ; Dose: 5 mg; Tome 1   tableta por v?a oral cada 6 horas seg?n sea necesario para el dolor.;   (Take one tablet by mouth every 6 hours as needed for Pain for up to 12   doses.); Quantity: 12 tablet; Refills: 0     ASK your doctor about these medications     gabapentin  300 mg capsule; Commonly known as: NEURONTIN ; Dose: 300 mg;   Tome una c?psula tres veces al d?a; (Take one capsule by mouth three times   daily.); Quantity: 90 capsule; Refills: 1       Return Appointments and Scheduled Appointments     Scheduled appointments:      Mar 01, 2024 2:30 PM  Office visit with Alm CHRISTELLA Daring, MD  Family Medicine: Medical [facility]) [address]  [address], [address]  [place]  [place] [address]  [phone]     Apr 15, 2024 2:00 PM  Follow-up visit with Larraine LITTIE Corona, APRN-NP  Gastroenterology: GARETH Kemp, Medical [facility] (Internal Medicine) [address]  [address] [place] [address]  [phone]            Consults, Procedures, Diagnostics, Micro, Pathology   Consults: General Surgery and GI  Surgical Procedures & Dates: None  Significant Diagnostic Studies, Micro and Procedures: None    - CTA a/p 9/24:   1. No evidence of active gastrointestinal bleeding.     2.  Prior Roux-en-Y gastric bypass. The previously described apparent   defect near the gastrojejunal anastomosis as well as apparent edema  between the stomach and pancreatic body are not confidently identified on   the current examination.     - IR 9/26:   Procedure(s):  US  and fluoro guided mesenteric angiogram with intervention     Findings:       Irregularity and pseudoaneurysmal degeneration of an area of the left and right gastric artery anastomosis, successfully coil embolized from both sides.      A distal branch of the left gastric was coiled to hemostasis and the entire right gastric artery was coiled to hemostasis.     No other significantly irregular vessel or active extravasation identified.       Significant Pathology: none     Wound:      Wounds Puncture Anterior;Right Neck (Active)   01/17/24 1215   Wound Type: Puncture   Orientation: Anterior;Right   Location: Neck   Wound Location Comments:    Initial Wound Site Closure:    Initial Dressing Placed:    Initial Cycle:    Initial Suction Setting (mmHg):    Pressure Injury Stages:    Pressure Injury Present Within 24 Hours of Hospital Admission:    If This Pressure Injury Is Suspected to Be Device Related, Please Select the Device::    Is the Wound Open or Closed:    Wound Assessment Dressing not removed for assessment 01/19/24 0800   Peri-wound Assessment Dry;Intact 01/19/24 0800   Wound Drainage Amount None 01/19/24 0800   Wound Dressing Status Intact 01/19/24 0800   Wound Care Dressing changed or new application 01/17/24 1300   Wound Dressing and/or Treatment Gauze;Transparent (i.e. Tegaderm) 01/19/24 0800   Number of days: 2       Puncture Wound (Sheath) Right Femoral (Active)   01/15/24 1348   Puncture Site: Orientation: Right   Puncture Site: Location: Femoral   Closure Device/Hemostatic Agent Angio-seal 01/19/24 0800   Hemostasis Time 1543 01/19/24 0800   Puncture Wound (Sheath) WDL WDL 01/19/24 0800   Number of days: 4                     Discharge Disposition, Condition   Patient Disposition: Home or Self Care [01]  Condition at Discharge: Stable    Code Status Full Code    Patient Instructions     Activity       Activity as Tolerated   As directed      It is important to keep increasing your activity level after you leave the hospital.  Moving around can help prevent blood clots, lung infection (pneumonia) and other problems.  Gradually increasing the number of times you are up moving around will help you return to your normal activity level more quickly.  Continue to increase the number of times you are up to the chair and walking daily to return to your normal activity level. Begin to work toward your normal activity level at discharge    Lifting Restrictions   As directed      Do not lift more than 10 pounds for 1 week(s) from 9/27          Diet       Regular Diet   As directed      You have no dietary restriction. Please continue with a healthy balanced diet.          Wounds/Lines/Drains       Incision Care   As directed      Groin insertion site:  *DO NOT soak incision in  water for 1 week - NO tub baths, hot tubs, or swimming.  *You may shower after discharge.             Discharge education provided to patient., Signs and Symptoms:   Report these signs and symptoms       Report These Signs and Symptoms   As directed      Please contact your doctor if you have any of the following symptoms: persistent nausea and/or vomiting, unable to urinate, or Bleeding from rectum or throwing up blood.        , Education: , and Others Instructions:   Other Orders       Questions About Your Stay   Complete by: As directed      Discharging attending physician: REENA MAUDIE HERO    Order comments: Si tiene preguntas o dudas sobre su estancia en el hospital, llame al [phone]            Additional Orders: Case Management, Supplies, Home Health     Home Health/DME       None          Signed:  MAUDIE HERO REENA, APRN-NP  01/19/2024      cc:  Primary Care Physician:  [name]   Verified    Referring physicians:  No ref. provider found   Additional provider(s):        Did we miss something? If additional records are needed, please fax a request on office letterhead to [phone]. Please include the patient's name, date of birth, fax number and type of information needed. Additional request can be made by email at [email or web address]. For general questions of information about electronic records sharing, call [phone].

## 2024-01-19 NOTE — Case Management (ED)
 Case Management Progress Note    NAME:Danny Kidd                          MRN: [id number]              DOB:December 25, 1980          AGE: 43 y.o.  ADMISSION DATE: 01/10/2024             DAYS ADMITTED: LOS: 9 days      Today's Date: 01/19/2024    PLAN: Patient to discharge home today. No NCM needs identified.     Expected Discharge Date: 01/19/2024   Is Patient Medically Stable: Yes   Are there Barriers to Discharge? no    INTERVENTION/DISPOSITION:  Discharge Planning              Discharge Planning: No Needs Identified  NCM reviewed EMR.   NCM attended and participated in MICU 4 huddle.   Patient to discharge home today. No NCM needs identified.  Transportation              Does the Patient Need Case Management to Arrange Discharge Transport? (ex: facility, ambulance, wheelchair/stretcher, Medicaid, cab, other): No  Will the Patient Use Family Transport?: Yes  Transportation Name, Phone and Availability #1: [name] [phone] will provide transportation upon discharge.  Support              Support: Huddle/team update  Info or Referral              Information or Referral to Community Resources: No Needs Identified  Positive SDOH Domains and Potential Barriers       Medication Needs              Medication Needs: No Needs Identified                 Financial              Financial: No Needs Identified  Legal              Legal: No Needs Identified  Other              Other/None: No needs identified  Discharge Disposition                                Danny Kidd, BSN, RN  Nurse Case Manager Family Medicine 3/MICU 4  Available on Voalte

## 2024-01-20 ENCOUNTER — Encounter
Admit: 2024-01-20 | Discharge: 2024-01-20 | Payer: PRIVATE HEALTH INSURANCE | Primary: Student in an Organized Health Care Education/Training Program

## 2024-01-20 MED FILL — NICOTINE 14 MG/24 HR TD PT24: 14 mg/d | TRANSDERMAL | 28 days supply | Qty: 28 | Fill #0 | Status: AC

## 2024-01-21 ENCOUNTER — Ambulatory Visit
Admit: 2024-01-21 | Discharge: 2024-01-21 | Payer: PRIVATE HEALTH INSURANCE | Primary: Student in an Organized Health Care Education/Training Program

## 2024-01-30 ENCOUNTER — Encounter
Admit: 2024-01-30 | Discharge: 2024-01-30 | Payer: PRIVATE HEALTH INSURANCE | Primary: Student in an Organized Health Care Education/Training Program

## 2024-03-01 ENCOUNTER — Encounter: Admit: 2024-03-01 | Discharge: 2024-03-01 | Payer: PRIVATE HEALTH INSURANCE

## 2024-03-01 MED FILL — LOSARTAN 50 MG PO TAB: 50 mg | ORAL | 30 days supply | Qty: 30 | Fill #1 | Status: CP

## 2024-03-08 ENCOUNTER — Ambulatory Visit: Admit: 2024-03-08 | Discharge: 2024-03-08 | Payer: PRIVATE HEALTH INSURANCE

## 2024-03-09 ENCOUNTER — Encounter: Admit: 2024-03-09 | Discharge: 2024-03-09 | Payer: PRIVATE HEALTH INSURANCE

## 2024-03-09 ENCOUNTER — Ambulatory Visit: Admit: 2024-03-09 | Discharge: 2024-03-09 | Payer: PRIVATE HEALTH INSURANCE

## 2024-03-09 MED ORDER — PROPOFOL INJ 10 MG/ML IV VIAL
INTRAVENOUS | 0 refills | Status: DC
Start: 2024-03-09 — End: 2024-03-09

## 2024-03-09 MED ORDER — PROPOFOL 10 MG/ML IV EMUL 20 ML (INFUSION)(AM)(OR)
INTRAVENOUS | 0 refills | Status: DC
Start: 2024-03-09 — End: 2024-03-09

## 2024-03-09 MED ORDER — LIDOCAINE (PF) 20 MG/ML (2 %) IJ SOLN
INTRAVENOUS | 0 refills | Status: DC
Start: 2024-03-09 — End: 2024-03-09

## 2024-03-09 MED ADMIN — LACTATED RINGERS IV SOLP [4318]: 1000.0000 mL | INTRAVENOUS | @ 14:00:00 | Stop: 2024-03-09 | NDC 00338011704

## 2024-03-11 ENCOUNTER — Encounter: Admit: 2024-03-11 | Discharge: 2024-03-11 | Payer: PRIVATE HEALTH INSURANCE

## 2024-04-15 ENCOUNTER — Encounter: Admit: 2024-04-15 | Discharge: 2024-04-15 | Payer: PRIVATE HEALTH INSURANCE

## 2024-04-15 ENCOUNTER — Ambulatory Visit: Admit: 2024-04-15 | Discharge: 2024-04-15 | Payer: PRIVATE HEALTH INSURANCE

## 2024-04-19 ENCOUNTER — Encounter: Admit: 2024-04-19 | Discharge: 2024-04-19 | Payer: PRIVATE HEALTH INSURANCE

## 2024-05-02 ENCOUNTER — Encounter: Admit: 2024-05-02 | Discharge: 2024-05-02 | Payer: PRIVATE HEALTH INSURANCE

## 2024-05-19 ENCOUNTER — Encounter: Admit: 2024-05-19 | Discharge: 2024-05-19 | Payer: PRIVATE HEALTH INSURANCE

## 2024-05-25 ENCOUNTER — Encounter: Admit: 2024-05-25 | Discharge: 2024-05-25 | Payer: PRIVATE HEALTH INSURANCE

## 2024-05-25 NOTE — Progress Notes [1]
 Patient Name:  Danny Kidd  MRN:  [id number]  DOB:  Feb 02, 1981  Age: 44 y.o.  Insurance:  Payor: [employer] / Plan: AMBETTER Marks / Product Type: *No Product type* /     Physician Info:   Referring Physician:  No ref. provider found   PCP:  No Pcp, Na     Diagnosis: Veins     Reason for Visit:  Return patient, self re-referred    -- left leg varicose veins    -- last seen by Dr. Leron on 02-18-22 for R leg wound, recommended R PAP    -- hx of R PAP on 04-03-22 by Dr. Leron; RGSV, RMP on 06-20-21 by Dr. Leron    -- Spanish Interp needed    Scheduled testing: N/A      Location of Films:  N/A    Date: Imaging: Impression:                         Medical History   has a past medical history of Cancer (CMS-HCC), DM (diabetes mellitus) (CMS-HCC), Heart failure with preserved ejection fraction (CMS-HCC), Hypertension, Lipoma, and Varicose veins of lower extremity.          Surgical History  has a past surgical history that includes tumor excision (Left, 08/19/2021); endovenous ablation of the great saphenous vein (Right, 06/20/2021); microphlebectomy (Right, 06/20/2021); Varicose vein surgery (Right, 04/03/2022); thyroidectomy (Left, 12/24/2022); gastric bypass; Upper gastrointestinal endoscopy (N/A, 01/12/2024); Upper gastrointestinal endoscopy (N/A, 01/13/2024); and Upper gastrointestinal endoscopy (N/A, 03/09/2024).    Allergies Allergies[1]       Medications:        acetaminophen  SR (TYLENOL  ARTHRITIS PAIN) 650 mg tablet Take one tablet by mouth every 8 hours as needed for Pain. Indications: pain    cetirizine  (ZYRTEC ) 10 mg tablet Take one tablet by mouth daily as needed for Allergy symptoms.    furosemide  (LASIX ) 40 mg tablet Take one tablet by mouth every morning.    gabapentin  (NEURONTIN ) 300 mg capsule Take one capsule by mouth at bedtime daily. Indications: neuropathic pain    losartan  (COZAAR ) 50 mg tablet Take one tablet by mouth daily.    metoprolol  succinate XL (TOPROL  XL) 100 mg extended release tablet Take one-half tablet by mouth daily.    multivit-min/iron/folic acid/K (BARIATRIC MULTIVITAMINS PO) Take 1 capsule by mouth daily.    nicotine  (NICODERM CQ ) 14 mg/day patch Apply one patch to top of skin as directed daily. Rotate patch location.  Indications: stop smoking    omeprazole  DR (PRILOSEC) 40 mg capsule Take one capsule by mouth twice daily. Open capsule    oxyCODONE  (ROXICODONE ) 5 mg tablet Take one tablet by mouth every 6 hours as needed for Pain for up to 12 doses.          [1] No Known Allergies

## 2024-05-26 ENCOUNTER — Encounter: Admit: 2024-05-26 | Discharge: 2024-05-26 | Payer: PRIVATE HEALTH INSURANCE
